# Patient Record
Sex: Male | Born: 1935 | Race: White | Hispanic: No | Marital: Married | Smoking: Never smoker
Health system: Southern US, Community
[De-identification: ages and names within clinical notes are randomized; demographics above are authoritative.]

## PROBLEM LIST (undated history)

## (undated) DIAGNOSIS — C8290 Follicular lymphoma, unspecified, unspecified site: Secondary | ICD-10-CM

## (undated) DIAGNOSIS — F419 Anxiety disorder, unspecified: Secondary | ICD-10-CM

## (undated) DIAGNOSIS — I10 Essential (primary) hypertension: Secondary | ICD-10-CM

## (undated) DIAGNOSIS — E079 Disorder of thyroid, unspecified: Secondary | ICD-10-CM

## (undated) HISTORY — DX: Anxiety disorder, unspecified: F41.9

## (undated) HISTORY — DX: Essential (primary) hypertension: I10

## (undated) HISTORY — DX: Follicular lymphoma, unspecified, unspecified site: C82.90

## (undated) HISTORY — DX: Disorder of thyroid, unspecified: E07.9

---

## 2008-11-24 HISTORY — PX: EYE SURGERY: SHX253

## 2012-01-14 DIAGNOSIS — H4011X0 Primary open-angle glaucoma, stage unspecified: Secondary | ICD-10-CM | POA: Diagnosis not present

## 2012-02-18 DIAGNOSIS — D2370 Other benign neoplasm of skin of unspecified lower limb, including hip: Secondary | ICD-10-CM | POA: Diagnosis not present

## 2012-03-18 DIAGNOSIS — C8299 Follicular lymphoma, unspecified, extranodal and solid organ sites: Secondary | ICD-10-CM | POA: Diagnosis not present

## 2012-03-18 DIAGNOSIS — Z87898 Personal history of other specified conditions: Secondary | ICD-10-CM | POA: Diagnosis not present

## 2012-04-12 DIAGNOSIS — I10 Essential (primary) hypertension: Secondary | ICD-10-CM | POA: Diagnosis not present

## 2012-04-12 DIAGNOSIS — R000 Tachycardia, unspecified: Secondary | ICD-10-CM | POA: Diagnosis not present

## 2012-04-12 DIAGNOSIS — Z79899 Other long term (current) drug therapy: Secondary | ICD-10-CM | POA: Diagnosis not present

## 2012-04-12 DIAGNOSIS — Z0000 Encounter for general adult medical examination without abnormal findings: Secondary | ICD-10-CM | POA: Diagnosis not present

## 2012-04-12 DIAGNOSIS — E039 Hypothyroidism, unspecified: Secondary | ICD-10-CM | POA: Diagnosis not present

## 2012-04-12 DIAGNOSIS — E785 Hyperlipidemia, unspecified: Secondary | ICD-10-CM | POA: Diagnosis not present

## 2012-04-30 DIAGNOSIS — H4011X0 Primary open-angle glaucoma, stage unspecified: Secondary | ICD-10-CM | POA: Diagnosis not present

## 2012-04-30 DIAGNOSIS — H35379 Puckering of macula, unspecified eye: Secondary | ICD-10-CM | POA: Diagnosis not present

## 2012-04-30 DIAGNOSIS — H409 Unspecified glaucoma: Secondary | ICD-10-CM | POA: Diagnosis not present

## 2012-04-30 DIAGNOSIS — H35349 Macular cyst, hole, or pseudohole, unspecified eye: Secondary | ICD-10-CM | POA: Diagnosis not present

## 2012-05-25 DIAGNOSIS — E039 Hypothyroidism, unspecified: Secondary | ICD-10-CM | POA: Diagnosis not present

## 2012-06-08 DIAGNOSIS — H35379 Puckering of macula, unspecified eye: Secondary | ICD-10-CM | POA: Diagnosis not present

## 2012-06-08 DIAGNOSIS — H35349 Macular cyst, hole, or pseudohole, unspecified eye: Secondary | ICD-10-CM | POA: Diagnosis not present

## 2012-06-08 DIAGNOSIS — H43819 Vitreous degeneration, unspecified eye: Secondary | ICD-10-CM | POA: Diagnosis not present

## 2012-06-15 DIAGNOSIS — E039 Hypothyroidism, unspecified: Secondary | ICD-10-CM | POA: Diagnosis not present

## 2012-06-30 DIAGNOSIS — D485 Neoplasm of uncertain behavior of skin: Secondary | ICD-10-CM | POA: Diagnosis not present

## 2012-06-30 DIAGNOSIS — L821 Other seborrheic keratosis: Secondary | ICD-10-CM | POA: Diagnosis not present

## 2012-06-30 DIAGNOSIS — L570 Actinic keratosis: Secondary | ICD-10-CM | POA: Diagnosis not present

## 2012-07-21 DIAGNOSIS — L570 Actinic keratosis: Secondary | ICD-10-CM | POA: Diagnosis not present

## 2012-08-23 DIAGNOSIS — Z23 Encounter for immunization: Secondary | ICD-10-CM | POA: Diagnosis not present

## 2012-10-13 DIAGNOSIS — I10 Essential (primary) hypertension: Secondary | ICD-10-CM | POA: Diagnosis not present

## 2012-10-13 DIAGNOSIS — E785 Hyperlipidemia, unspecified: Secondary | ICD-10-CM | POA: Diagnosis not present

## 2012-10-13 DIAGNOSIS — Z23 Encounter for immunization: Secondary | ICD-10-CM | POA: Diagnosis not present

## 2012-10-13 DIAGNOSIS — E039 Hypothyroidism, unspecified: Secondary | ICD-10-CM | POA: Diagnosis not present

## 2012-10-13 DIAGNOSIS — R000 Tachycardia, unspecified: Secondary | ICD-10-CM | POA: Diagnosis not present

## 2012-12-03 DIAGNOSIS — H35349 Macular cyst, hole, or pseudohole, unspecified eye: Secondary | ICD-10-CM | POA: Diagnosis not present

## 2012-12-03 DIAGNOSIS — H409 Unspecified glaucoma: Secondary | ICD-10-CM | POA: Diagnosis not present

## 2012-12-03 DIAGNOSIS — Z961 Presence of intraocular lens: Secondary | ICD-10-CM | POA: Diagnosis not present

## 2012-12-03 DIAGNOSIS — H4011X0 Primary open-angle glaucoma, stage unspecified: Secondary | ICD-10-CM | POA: Diagnosis not present

## 2013-01-28 DIAGNOSIS — Z01818 Encounter for other preprocedural examination: Secondary | ICD-10-CM | POA: Diagnosis not present

## 2013-02-08 DIAGNOSIS — L570 Actinic keratosis: Secondary | ICD-10-CM | POA: Diagnosis not present

## 2013-02-08 DIAGNOSIS — D0420 Carcinoma in situ of skin of unspecified ear and external auricular canal: Secondary | ICD-10-CM | POA: Diagnosis not present

## 2013-02-08 DIAGNOSIS — Z85828 Personal history of other malignant neoplasm of skin: Secondary | ICD-10-CM | POA: Diagnosis not present

## 2013-02-08 DIAGNOSIS — L820 Inflamed seborrheic keratosis: Secondary | ICD-10-CM | POA: Diagnosis not present

## 2013-02-11 DIAGNOSIS — IMO0001 Reserved for inherently not codable concepts without codable children: Secondary | ICD-10-CM | POA: Diagnosis not present

## 2013-02-11 DIAGNOSIS — IMO0002 Reserved for concepts with insufficient information to code with codable children: Secondary | ICD-10-CM | POA: Diagnosis not present

## 2013-03-07 DIAGNOSIS — I10 Essential (primary) hypertension: Secondary | ICD-10-CM | POA: Diagnosis not present

## 2013-03-07 DIAGNOSIS — R000 Tachycardia, unspecified: Secondary | ICD-10-CM | POA: Diagnosis not present

## 2013-03-07 DIAGNOSIS — M542 Cervicalgia: Secondary | ICD-10-CM | POA: Diagnosis not present

## 2013-03-16 DIAGNOSIS — Z87898 Personal history of other specified conditions: Secondary | ICD-10-CM | POA: Diagnosis not present

## 2013-03-16 DIAGNOSIS — C8299 Follicular lymphoma, unspecified, extranodal and solid organ sites: Secondary | ICD-10-CM | POA: Diagnosis not present

## 2013-05-11 DIAGNOSIS — L570 Actinic keratosis: Secondary | ICD-10-CM | POA: Diagnosis not present

## 2013-05-11 DIAGNOSIS — C44221 Squamous cell carcinoma of skin of unspecified ear and external auricular canal: Secondary | ICD-10-CM | POA: Diagnosis not present

## 2013-07-09 DIAGNOSIS — R21 Rash and other nonspecific skin eruption: Secondary | ICD-10-CM | POA: Diagnosis not present

## 2013-07-09 DIAGNOSIS — B356 Tinea cruris: Secondary | ICD-10-CM | POA: Diagnosis not present

## 2013-07-12 DIAGNOSIS — E785 Hyperlipidemia, unspecified: Secondary | ICD-10-CM | POA: Diagnosis not present

## 2013-07-12 DIAGNOSIS — D696 Thrombocytopenia, unspecified: Secondary | ICD-10-CM | POA: Diagnosis not present

## 2013-07-12 DIAGNOSIS — I2699 Other pulmonary embolism without acute cor pulmonale: Secondary | ICD-10-CM | POA: Diagnosis not present

## 2013-07-12 DIAGNOSIS — Z86718 Personal history of other venous thrombosis and embolism: Secondary | ICD-10-CM | POA: Diagnosis not present

## 2013-07-12 DIAGNOSIS — R609 Edema, unspecified: Secondary | ICD-10-CM | POA: Diagnosis not present

## 2013-07-12 DIAGNOSIS — I824Y9 Acute embolism and thrombosis of unspecified deep veins of unspecified proximal lower extremity: Secondary | ICD-10-CM | POA: Diagnosis not present

## 2013-07-12 DIAGNOSIS — I824Z9 Acute embolism and thrombosis of unspecified deep veins of unspecified distal lower extremity: Secondary | ICD-10-CM | POA: Diagnosis not present

## 2013-07-12 DIAGNOSIS — I82409 Acute embolism and thrombosis of unspecified deep veins of unspecified lower extremity: Secondary | ICD-10-CM | POA: Diagnosis not present

## 2013-07-12 DIAGNOSIS — Z87898 Personal history of other specified conditions: Secondary | ICD-10-CM | POA: Diagnosis not present

## 2013-07-12 DIAGNOSIS — Z7901 Long term (current) use of anticoagulants: Secondary | ICD-10-CM | POA: Diagnosis not present

## 2013-07-12 DIAGNOSIS — I10 Essential (primary) hypertension: Secondary | ICD-10-CM | POA: Diagnosis not present

## 2013-07-13 DIAGNOSIS — I82409 Acute embolism and thrombosis of unspecified deep veins of unspecified lower extremity: Secondary | ICD-10-CM | POA: Diagnosis not present

## 2013-07-15 DIAGNOSIS — R51 Headache: Secondary | ICD-10-CM | POA: Diagnosis not present

## 2013-07-21 DIAGNOSIS — I82409 Acute embolism and thrombosis of unspecified deep veins of unspecified lower extremity: Secondary | ICD-10-CM | POA: Diagnosis not present

## 2013-07-21 DIAGNOSIS — Z87898 Personal history of other specified conditions: Secondary | ICD-10-CM | POA: Diagnosis not present

## 2013-08-04 DIAGNOSIS — Z01812 Encounter for preprocedural laboratory examination: Secondary | ICD-10-CM | POA: Diagnosis not present

## 2013-08-04 DIAGNOSIS — C8589 Other specified types of non-Hodgkin lymphoma, extranodal and solid organ sites: Secondary | ICD-10-CM | POA: Diagnosis not present

## 2013-08-05 DIAGNOSIS — H40019 Open angle with borderline findings, low risk, unspecified eye: Secondary | ICD-10-CM | POA: Diagnosis not present

## 2013-08-05 DIAGNOSIS — H35379 Puckering of macula, unspecified eye: Secondary | ICD-10-CM | POA: Diagnosis not present

## 2013-08-05 DIAGNOSIS — Z961 Presence of intraocular lens: Secondary | ICD-10-CM | POA: Diagnosis not present

## 2013-08-09 DIAGNOSIS — Z87898 Personal history of other specified conditions: Secondary | ICD-10-CM | POA: Diagnosis not present

## 2013-08-09 DIAGNOSIS — R918 Other nonspecific abnormal finding of lung field: Secondary | ICD-10-CM | POA: Diagnosis not present

## 2013-08-09 DIAGNOSIS — I82409 Acute embolism and thrombosis of unspecified deep veins of unspecified lower extremity: Secondary | ICD-10-CM | POA: Diagnosis not present

## 2013-08-19 DIAGNOSIS — R918 Other nonspecific abnormal finding of lung field: Secondary | ICD-10-CM | POA: Diagnosis not present

## 2013-08-19 DIAGNOSIS — I82409 Acute embolism and thrombosis of unspecified deep veins of unspecified lower extremity: Secondary | ICD-10-CM | POA: Diagnosis not present

## 2013-08-23 DIAGNOSIS — I10 Essential (primary) hypertension: Secondary | ICD-10-CM | POA: Diagnosis not present

## 2013-08-23 DIAGNOSIS — I82409 Acute embolism and thrombosis of unspecified deep veins of unspecified lower extremity: Secondary | ICD-10-CM | POA: Diagnosis not present

## 2013-11-03 DIAGNOSIS — Z23 Encounter for immunization: Secondary | ICD-10-CM | POA: Diagnosis not present

## 2013-11-03 DIAGNOSIS — E785 Hyperlipidemia, unspecified: Secondary | ICD-10-CM | POA: Diagnosis not present

## 2013-11-03 DIAGNOSIS — Z719 Counseling, unspecified: Secondary | ICD-10-CM | POA: Diagnosis not present

## 2013-11-03 DIAGNOSIS — E039 Hypothyroidism, unspecified: Secondary | ICD-10-CM | POA: Diagnosis not present

## 2013-11-03 DIAGNOSIS — I10 Essential (primary) hypertension: Secondary | ICD-10-CM | POA: Diagnosis not present

## 2013-11-03 DIAGNOSIS — Z79899 Other long term (current) drug therapy: Secondary | ICD-10-CM | POA: Diagnosis not present

## 2013-11-09 DIAGNOSIS — C4441 Basal cell carcinoma of skin of scalp and neck: Secondary | ICD-10-CM | POA: Diagnosis not present

## 2013-11-09 DIAGNOSIS — D235 Other benign neoplasm of skin of trunk: Secondary | ICD-10-CM | POA: Diagnosis not present

## 2013-11-09 DIAGNOSIS — L821 Other seborrheic keratosis: Secondary | ICD-10-CM | POA: Diagnosis not present

## 2013-11-09 DIAGNOSIS — L570 Actinic keratosis: Secondary | ICD-10-CM | POA: Diagnosis not present

## 2014-02-07 DIAGNOSIS — D0460 Carcinoma in situ of skin of unspecified upper limb, including shoulder: Secondary | ICD-10-CM | POA: Diagnosis not present

## 2014-02-07 DIAGNOSIS — L570 Actinic keratosis: Secondary | ICD-10-CM | POA: Diagnosis not present

## 2014-02-26 DIAGNOSIS — J309 Allergic rhinitis, unspecified: Secondary | ICD-10-CM | POA: Diagnosis not present

## 2014-02-26 DIAGNOSIS — R070 Pain in throat: Secondary | ICD-10-CM | POA: Diagnosis not present

## 2014-03-10 DIAGNOSIS — Z7189 Other specified counseling: Secondary | ICD-10-CM | POA: Diagnosis not present

## 2014-03-10 DIAGNOSIS — F411 Generalized anxiety disorder: Secondary | ICD-10-CM | POA: Diagnosis not present

## 2014-03-10 DIAGNOSIS — I82409 Acute embolism and thrombosis of unspecified deep veins of unspecified lower extremity: Secondary | ICD-10-CM | POA: Diagnosis not present

## 2014-03-10 DIAGNOSIS — C8589 Other specified types of non-Hodgkin lymphoma, extranodal and solid organ sites: Secondary | ICD-10-CM | POA: Diagnosis not present

## 2014-03-14 DIAGNOSIS — E7800 Pure hypercholesterolemia, unspecified: Secondary | ICD-10-CM | POA: Diagnosis not present

## 2014-03-14 DIAGNOSIS — C8589 Other specified types of non-Hodgkin lymphoma, extranodal and solid organ sites: Secondary | ICD-10-CM | POA: Diagnosis not present

## 2014-03-14 DIAGNOSIS — I10 Essential (primary) hypertension: Secondary | ICD-10-CM | POA: Diagnosis not present

## 2014-03-14 DIAGNOSIS — E039 Hypothyroidism, unspecified: Secondary | ICD-10-CM | POA: Diagnosis not present

## 2014-03-27 ENCOUNTER — Ambulatory Visit: Payer: Self-pay | Admitting: Oncology

## 2014-03-27 DIAGNOSIS — E039 Hypothyroidism, unspecified: Secondary | ICD-10-CM | POA: Diagnosis not present

## 2014-03-27 DIAGNOSIS — I87099 Postthrombotic syndrome with other complications of unspecified lower extremity: Secondary | ICD-10-CM | POA: Diagnosis not present

## 2014-03-27 DIAGNOSIS — Z79899 Other long term (current) drug therapy: Secondary | ICD-10-CM | POA: Diagnosis not present

## 2014-03-27 DIAGNOSIS — I890 Lymphedema, not elsewhere classified: Secondary | ICD-10-CM | POA: Diagnosis not present

## 2014-03-27 DIAGNOSIS — D693 Immune thrombocytopenic purpura: Secondary | ICD-10-CM | POA: Diagnosis not present

## 2014-03-27 DIAGNOSIS — M7989 Other specified soft tissue disorders: Secondary | ICD-10-CM | POA: Diagnosis not present

## 2014-03-27 DIAGNOSIS — R5383 Other fatigue: Secondary | ICD-10-CM | POA: Diagnosis not present

## 2014-03-27 DIAGNOSIS — C8299 Follicular lymphoma, unspecified, extranodal and solid organ sites: Secondary | ICD-10-CM | POA: Diagnosis not present

## 2014-03-27 DIAGNOSIS — F411 Generalized anxiety disorder: Secondary | ICD-10-CM | POA: Diagnosis not present

## 2014-03-27 DIAGNOSIS — I872 Venous insufficiency (chronic) (peripheral): Secondary | ICD-10-CM | POA: Diagnosis not present

## 2014-03-27 DIAGNOSIS — I82409 Acute embolism and thrombosis of unspecified deep veins of unspecified lower extremity: Secondary | ICD-10-CM | POA: Diagnosis not present

## 2014-03-27 DIAGNOSIS — I10 Essential (primary) hypertension: Secondary | ICD-10-CM | POA: Diagnosis not present

## 2014-03-27 DIAGNOSIS — Z7901 Long term (current) use of anticoagulants: Secondary | ICD-10-CM | POA: Diagnosis not present

## 2014-03-27 DIAGNOSIS — R5381 Other malaise: Secondary | ICD-10-CM | POA: Diagnosis not present

## 2014-03-27 LAB — LACTATE DEHYDROGENASE: LDH: 244 U/L — ABNORMAL HIGH (ref 85–241)

## 2014-03-27 LAB — SEDIMENTATION RATE: Erythrocyte Sed Rate: 10 mm/hr (ref 0–20)

## 2014-04-24 ENCOUNTER — Ambulatory Visit: Payer: Self-pay | Admitting: Oncology

## 2014-04-24 DIAGNOSIS — I824Y9 Acute embolism and thrombosis of unspecified deep veins of unspecified proximal lower extremity: Secondary | ICD-10-CM | POA: Diagnosis not present

## 2014-04-24 DIAGNOSIS — I10 Essential (primary) hypertension: Secondary | ICD-10-CM | POA: Diagnosis not present

## 2014-04-24 DIAGNOSIS — E785 Hyperlipidemia, unspecified: Secondary | ICD-10-CM | POA: Diagnosis not present

## 2014-04-24 DIAGNOSIS — M7989 Other specified soft tissue disorders: Secondary | ICD-10-CM | POA: Diagnosis not present

## 2014-05-19 DIAGNOSIS — D1801 Hemangioma of skin and subcutaneous tissue: Secondary | ICD-10-CM | POA: Diagnosis not present

## 2014-05-19 DIAGNOSIS — L570 Actinic keratosis: Secondary | ICD-10-CM | POA: Diagnosis not present

## 2014-05-25 ENCOUNTER — Encounter: Payer: Self-pay | Admitting: Podiatrist

## 2014-05-25 ENCOUNTER — Ambulatory Visit (INDEPENDENT_AMBULATORY_CARE_PROVIDER_SITE_OTHER): Payer: Medicare Other | Admitting: Podiatrist

## 2014-05-25 VITALS — BP 150/88 | HR 82 | Resp 16 | Ht 67.0 in | Wt 152.0 lb

## 2014-05-25 DIAGNOSIS — B351 Tinea unguium: Secondary | ICD-10-CM | POA: Diagnosis not present

## 2014-05-25 DIAGNOSIS — M79673 Pain in unspecified foot: Secondary | ICD-10-CM

## 2014-05-25 DIAGNOSIS — M79609 Pain in unspecified limb: Secondary | ICD-10-CM

## 2014-05-25 NOTE — Progress Notes (Signed)
   Subjective:    Patient ID: Manuel Reynolds, male    DOB: [birth year], 80 y.o.   MRN: [id number]  HPI Comments: i need my toenails trimmed. They do not hurt but i have discomfort in them. They have been like this for years. i dont know if they are getting worse. i cut my toenails.     Review of Systems  All other systems reviewed and are negative.      Objective:   Physical Exam Neurovascular status is intact pedal pulses are palpable in sensation is intact bilateral. Patient's toenails are elongated, thickened, discolored, dystrophic, clinically mycotic and painful.       Assessment & Plan:  Symptomatic mycotic toenails bilateral.  Plan: Debridement of nails was carried out today without complication. He'll be seen back as needed for followup.

## 2014-06-16 DIAGNOSIS — E039 Hypothyroidism, unspecified: Secondary | ICD-10-CM | POA: Diagnosis not present

## 2014-06-16 DIAGNOSIS — I10 Essential (primary) hypertension: Secondary | ICD-10-CM | POA: Diagnosis not present

## 2014-06-16 DIAGNOSIS — C8589 Other specified types of non-Hodgkin lymphoma, extranodal and solid organ sites: Secondary | ICD-10-CM | POA: Diagnosis not present

## 2014-06-16 DIAGNOSIS — E7800 Pure hypercholesterolemia, unspecified: Secondary | ICD-10-CM | POA: Diagnosis not present

## 2014-06-19 DIAGNOSIS — L570 Actinic keratosis: Secondary | ICD-10-CM | POA: Diagnosis not present

## 2014-06-23 DIAGNOSIS — H4010X0 Unspecified open-angle glaucoma, stage unspecified: Secondary | ICD-10-CM | POA: Diagnosis not present

## 2014-07-24 DIAGNOSIS — L570 Actinic keratosis: Secondary | ICD-10-CM | POA: Diagnosis not present

## 2014-07-28 DIAGNOSIS — H4010X0 Unspecified open-angle glaucoma, stage unspecified: Secondary | ICD-10-CM | POA: Diagnosis not present

## 2014-07-31 DIAGNOSIS — Z23 Encounter for immunization: Secondary | ICD-10-CM | POA: Diagnosis not present

## 2014-08-04 DIAGNOSIS — H4010X0 Unspecified open-angle glaucoma, stage unspecified: Secondary | ICD-10-CM | POA: Diagnosis not present

## 2014-08-10 DIAGNOSIS — D485 Neoplasm of uncertain behavior of skin: Secondary | ICD-10-CM | POA: Diagnosis not present

## 2014-08-10 DIAGNOSIS — L570 Actinic keratosis: Secondary | ICD-10-CM | POA: Diagnosis not present

## 2014-08-10 DIAGNOSIS — C44621 Squamous cell carcinoma of skin of unspecified upper limb, including shoulder: Secondary | ICD-10-CM | POA: Diagnosis not present

## 2014-08-17 DIAGNOSIS — C44621 Squamous cell carcinoma of skin of unspecified upper limb, including shoulder: Secondary | ICD-10-CM | POA: Diagnosis not present

## 2014-09-01 DIAGNOSIS — H4011X2 Primary open-angle glaucoma, moderate stage: Secondary | ICD-10-CM | POA: Diagnosis not present

## 2014-09-27 ENCOUNTER — Ambulatory Visit: Payer: Self-pay | Admitting: Oncology

## 2014-09-27 DIAGNOSIS — D693 Immune thrombocytopenic purpura: Secondary | ICD-10-CM | POA: Diagnosis not present

## 2014-09-27 DIAGNOSIS — F419 Anxiety disorder, unspecified: Secondary | ICD-10-CM | POA: Diagnosis not present

## 2014-09-27 DIAGNOSIS — I10 Essential (primary) hypertension: Secondary | ICD-10-CM | POA: Diagnosis not present

## 2014-09-27 DIAGNOSIS — Z79899 Other long term (current) drug therapy: Secondary | ICD-10-CM | POA: Diagnosis not present

## 2014-09-27 DIAGNOSIS — C8290 Follicular lymphoma, unspecified, unspecified site: Secondary | ICD-10-CM | POA: Diagnosis not present

## 2014-09-27 DIAGNOSIS — Z86718 Personal history of other venous thrombosis and embolism: Secondary | ICD-10-CM | POA: Diagnosis not present

## 2014-09-27 DIAGNOSIS — R531 Weakness: Secondary | ICD-10-CM | POA: Diagnosis not present

## 2014-09-27 DIAGNOSIS — E039 Hypothyroidism, unspecified: Secondary | ICD-10-CM | POA: Diagnosis not present

## 2014-09-27 DIAGNOSIS — R5383 Other fatigue: Secondary | ICD-10-CM | POA: Diagnosis not present

## 2014-09-27 DIAGNOSIS — Z9221 Personal history of antineoplastic chemotherapy: Secondary | ICD-10-CM | POA: Diagnosis not present

## 2014-09-27 LAB — COMPREHENSIVE METABOLIC PANEL
Albumin: 3.8 g/dL (ref 3.4–5.0)
Alkaline Phosphatase: 81 U/L
Anion Gap: 7 (ref 7–16)
BUN: 24 mg/dL — ABNORMAL HIGH (ref 7–18)
Bilirubin,Total: 0.4 mg/dL (ref 0.2–1.0)
Calcium, Total: 9 mg/dL (ref 8.5–10.1)
Chloride: 105 mmol/L (ref 98–107)
Co2: 26 mmol/L (ref 21–32)
Creatinine: 1.36 mg/dL — ABNORMAL HIGH (ref 0.60–1.30)
EGFR (African American): >60
EGFR (Non-African Amer.): 53 — ABNORMAL LOW
Glucose: 93 mg/dL (ref 65–99)
Osmolality: 279 (ref 275–301)
Potassium: 4.2 mmol/L (ref 3.5–5.1)
SGOT(AST): 19 U/L (ref 15–37)
SGPT (ALT): 27 U/L
Sodium: 138 mmol/L (ref 136–145)
Total Protein: 7 g/dL (ref 6.4–8.2)

## 2014-09-27 LAB — CBC CANCER CENTER
Basophil #: 0 x10 3/mm (ref 0.0–0.1)
Basophil %: 0.4 %
Eosinophil #: 0.1 x10 3/mm (ref 0.0–0.7)
Eosinophil %: 1.2 %
HCT: 42.3 % (ref 40.0–52.0)
HGB: 14.3 g/dL (ref 13.0–18.0)
Lymphocyte #: 2.5 x10 3/mm (ref 1.0–3.6)
Lymphocyte %: 30.5 %
MCH: 29.8 pg (ref 26.0–34.0)
MCHC: 33.9 g/dL (ref 32.0–36.0)
MCV: 88 fL (ref 80–100)
Monocyte #: 0.9 x10 3/mm (ref 0.2–1.0)
Monocyte %: 11 %
Neutrophil #: 4.7 x10 3/mm (ref 1.4–6.5)
Neutrophil %: 56.9 %
Platelet: 234 x10 3/mm (ref 150–440)
RBC: 4.81 10*6/uL (ref 4.40–5.90)
RDW: 15 % — ABNORMAL HIGH (ref 11.5–14.5)
WBC: 8.3 x10 3/mm (ref 3.8–10.6)

## 2014-09-27 LAB — LACTATE DEHYDROGENASE: LDH: 204 U/L (ref 85–241)

## 2014-09-27 LAB — SEDIMENTATION RATE: Erythrocyte Sed Rate: 17 mm/hr (ref 0–20)

## 2014-09-29 DIAGNOSIS — H4011X2 Primary open-angle glaucoma, moderate stage: Secondary | ICD-10-CM | POA: Diagnosis not present

## 2014-10-13 DIAGNOSIS — X32XXXA Exposure to sunlight, initial encounter: Secondary | ICD-10-CM | POA: Diagnosis not present

## 2014-10-13 DIAGNOSIS — L570 Actinic keratosis: Secondary | ICD-10-CM | POA: Diagnosis not present

## 2014-10-13 DIAGNOSIS — L821 Other seborrheic keratosis: Secondary | ICD-10-CM | POA: Diagnosis not present

## 2014-10-13 DIAGNOSIS — Z85828 Personal history of other malignant neoplasm of skin: Secondary | ICD-10-CM | POA: Diagnosis not present

## 2014-10-24 ENCOUNTER — Ambulatory Visit: Payer: Self-pay | Admitting: Oncology

## 2014-10-26 DIAGNOSIS — I10 Essential (primary) hypertension: Secondary | ICD-10-CM | POA: Diagnosis not present

## 2014-10-26 DIAGNOSIS — I87099 Postthrombotic syndrome with other complications of unspecified lower extremity: Secondary | ICD-10-CM | POA: Diagnosis not present

## 2014-10-26 DIAGNOSIS — E785 Hyperlipidemia, unspecified: Secondary | ICD-10-CM | POA: Diagnosis not present

## 2015-02-13 DIAGNOSIS — H4011X2 Primary open-angle glaucoma, moderate stage: Secondary | ICD-10-CM | POA: Diagnosis not present

## 2015-05-11 ENCOUNTER — Ambulatory Visit (INDEPENDENT_AMBULATORY_CARE_PROVIDER_SITE_OTHER): Payer: Medicare Other | Admitting: Family Medicine

## 2015-05-11 ENCOUNTER — Encounter: Payer: Self-pay | Admitting: Family Medicine

## 2015-05-11 VITALS — BP 140/80 | HR 80 | Temp 98.6°F | Resp 16 | Ht 67.0 in | Wt 157.8 lb

## 2015-05-11 DIAGNOSIS — I10 Essential (primary) hypertension: Secondary | ICD-10-CM | POA: Diagnosis not present

## 2015-05-11 DIAGNOSIS — N5201 Erectile dysfunction due to arterial insufficiency: Secondary | ICD-10-CM | POA: Diagnosis not present

## 2015-05-11 DIAGNOSIS — E7800 Pure hypercholesterolemia, unspecified: Secondary | ICD-10-CM

## 2015-05-11 DIAGNOSIS — F419 Anxiety disorder, unspecified: Secondary | ICD-10-CM | POA: Diagnosis not present

## 2015-05-11 DIAGNOSIS — E785 Hyperlipidemia, unspecified: Secondary | ICD-10-CM | POA: Insufficient documentation

## 2015-05-11 DIAGNOSIS — E038 Other specified hypothyroidism: Secondary | ICD-10-CM

## 2015-05-11 DIAGNOSIS — N183 Chronic kidney disease, stage 3 unspecified: Secondary | ICD-10-CM | POA: Insufficient documentation

## 2015-05-11 DIAGNOSIS — E034 Atrophy of thyroid (acquired): Secondary | ICD-10-CM

## 2015-05-11 DIAGNOSIS — E039 Hypothyroidism, unspecified: Secondary | ICD-10-CM | POA: Insufficient documentation

## 2015-05-11 DIAGNOSIS — I129 Hypertensive chronic kidney disease with stage 1 through stage 4 chronic kidney disease, or unspecified chronic kidney disease: Secondary | ICD-10-CM | POA: Insufficient documentation

## 2015-05-11 DIAGNOSIS — N529 Male erectile dysfunction, unspecified: Secondary | ICD-10-CM | POA: Insufficient documentation

## 2015-05-11 MED ORDER — SIMVASTATIN 40 MG PO TABS: 40.0000 mg | ORAL_TABLET | Freq: Every day | ORAL | Status: DC

## 2015-05-11 MED ORDER — VIAGRA 100 MG PO TABS: 50.0000 mg | ORAL_TABLET | ORAL | Status: DC | PRN

## 2015-05-11 MED ORDER — ACEBUTOLOL HCL 200 MG PO CAPS: 200.0000 mg | ORAL_CAPSULE | Freq: Every day | ORAL | Status: DC

## 2015-05-11 MED ORDER — AMLODIPINE BESYLATE 10 MG PO TABS: 10.0000 mg | ORAL_TABLET | Freq: Every day | ORAL | Status: DC

## 2015-05-11 MED ORDER — DIAZEPAM 2 MG PO TABS: 2.0000 mg | ORAL_TABLET | ORAL | Status: DC | PRN

## 2015-05-11 MED ORDER — SERTRALINE HCL 25 MG PO TABS: 25.0000 mg | ORAL_TABLET | Freq: Every day | ORAL | Status: DC

## 2015-05-11 MED ORDER — LOSARTAN POTASSIUM 50 MG PO TABS: 50.0000 mg | ORAL_TABLET | Freq: Every day | ORAL | Status: DC

## 2015-05-11 MED ORDER — LEVOTHYROXINE SODIUM 50 MCG PO TABS: 50.0000 ug | ORAL_TABLET | Freq: Every day | ORAL | Status: DC

## 2015-05-11 NOTE — Progress Notes (Signed)
Name: Manuel Reynolds   MRN: [id number]    DOB: Nov 13, 1930   Date:05/11/2015       Progress Note  Subjective  Chief Complaint  Chief Complaint  Patient presents with  . Hypertension    HPI   Here to get meds refilled.  No visit in past 11 months.  No c/o except anxiety.   Takes Valium 2 mg about every day.  Still ujses Viagra once every 3-4 weeks  Past Medical History  Diagnosis Date  . Anxiety   . Hypertension   . Depression   . Thyroid disease     Past Surgical History  Procedure Laterality Date  . Eye surgery  2010    cataract    Family History  Problem Relation Age of Onset  . Hypertension Father   . Cancer Sister     History   Social History  . Marital Status: Married    Spouse Name: N/A  . Number of Children: N/A  . Years of Education: N/A   Occupational History  . Not on file.   Social History Main Topics  . Smoking status: Never Smoker   . Smokeless tobacco: Never Used  . Alcohol Use: No  . Drug Use: No  . Sexual Activity: Not on file   Other Topics Concern  . Not on file   Social History Narrative     Current outpatient prescriptions:  .  acebutolol (SECTRAL) 200 MG capsule, Take 200 mg by mouth daily., Disp: , Rfl:  .  amLODipine (NORVASC) 10 MG tablet, Take 10 mg by mouth daily., Disp: , Rfl:  .  COMBIGAN 0.2-0.5 % ophthalmic solution, INT 1 GTT IN OU BID, Disp: , Rfl: 5 .  DIAZEPAM PO, Take by mouth as needed., Disp: , Rfl:  .  Hydrocodone-Acetaminophen (VICODIN PO), Take by mouth as needed., Disp: , Rfl:  .  latanoprost (XALATAN) 0.005 % ophthalmic solution, 1 drop at bedtime., Disp: , Rfl:  .  levothyroxine (SYNTHROID, LEVOTHROID) 50 MCG tablet, TK 1 T PO D, Disp: , Rfl: 3 .  losartan (COZAAR) 50 MG tablet, TK 1 T PO D, Disp: , Rfl: 3 .  olmesartan (BENICAR) 20 MG tablet, Take 20 mg by mouth daily., Disp: , Rfl:  .  simvastatin (ZOCOR) 40 MG tablet, Take 40 mg by mouth daily., Disp: , Rfl:  .  VIAGRA 100 MG tablet, Take 0.5 tablets (50  mg total) by mouth as needed for erectile dysfunction., Disp: 6 tablet, Rfl: 12  Allergies  Allergen Reactions  . Sulfa Antibiotics Rash     Review of Systems  Constitutional: Negative.  Negative for fever, chills and malaise/fatigue.  HENT: Negative.  Negative for nosebleeds.   Eyes: Negative.  Negative for blurred vision and double vision.  Respiratory: Negative.  Negative for cough, sputum production, shortness of breath and wheezing.   Cardiovascular: Negative.  Negative for chest pain, palpitations, orthopnea and leg swelling.  Gastrointestinal: Negative.  Negative for heartburn, nausea, vomiting, abdominal pain, diarrhea and blood in stool.  Genitourinary: Positive for frequency (nocturia x 2). Negative for dysuria and hematuria.  Musculoskeletal: Negative.  Negative for myalgias and joint pain.  Skin: Negative.  Negative for rash.  Neurological: Negative.  Negative for dizziness, sensory change, speech change, focal weakness, weakness and headaches.  Psychiatric/Behavioral: Positive for hallucinations. Nervous/anxious: takes Valium every day.       Objective  Filed Vitals:   05/11/15 1554  BP: 143/77  Pulse: 80  Temp: 98.6 F (37 C)  Resp: 16  Height: 5\' 7"  (1.702 m)  Weight: 157 lb 12.8 oz (71.578 kg)    Physical Exam  Constitutional: He is oriented to person, place, and time and well-developed, well-nourished, and in no distress. No distress.  HENT:  Head: Normocephalic and atraumatic.  Eyes: EOM are normal. Pupils are equal, round, and reactive to light.  Neck: Normal range of motion. Neck supple. No thyromegaly present.  Cardiovascular: Normal rate, regular rhythm and intact distal pulses.  Exam reveals no gallop and no friction rub.   Murmur heard.  Crescendo systolic murmur is present with a grade of 3/6  Pulmonary/Chest: Effort normal and breath sounds normal. No respiratory distress. He has no wheezes. He has no rales. He exhibits no tenderness.   Abdominal: Soft. Bowel sounds are normal. He exhibits no mass. There is no tenderness.  Musculoskeletal: Normal range of motion. He exhibits edema.       Right lower leg: He exhibits edema (trace).       Left lower leg: He exhibits edema (1+).  Lymphadenopathy:    He has no cervical adenopathy.  Neurological: He is alert and oriented to person, place, and time.  Psychiatric: Mood, memory, affect and judgment normal.  Vitals reviewed.       Assessment & Plan  Problem List Items Addressed This Visit      Cardiovascular and Mediastinum   Hypertension - Primary   Relevant Medications   losartan (COZAAR) 50 MG tablet   VIAGRA 100 MG tablet     Endocrine   Hypothyroidism   Relevant Medications   levothyroxine (SYNTHROID, LEVOTHROID) 50 MCG tablet     Genitourinary   ED (erectile dysfunction)     Other   Elevated cholesterol   Relevant Medications   losartan (COZAAR) 50 MG tablet   VIAGRA 100 MG tablet   Acute anxiety      Meds ordered this encounter  Medications  . COMBIGAN 0.2-0.5 % ophthalmic solution    Sig: INT 1 GTT IN OU BID    Refill:  5  . levothyroxine (SYNTHROID, LEVOTHROID) 50 MCG tablet    Sig: TK 1 T PO D    Refill:  3  . losartan (COZAAR) 50 MG tablet    Sig: TK 1 T PO D    Refill:  3  . DISCONTD: VIAGRA 100 MG tablet    Sig: TK 1 T PO D    Refill:  10  . VIAGRA 100 MG tablet    Sig: Take 0.5 tablets (50 mg total) by mouth as needed for erectile dysfunction.    Dispense:  6 tablet    Refill:  12    1. Essential hypertension  - acebutolol (SECTRAL) 200 MG capsule; Take 1 capsule (200 mg total) by mouth daily.  Dispense: 30 capsule; Refill: 12 - amLODipine (NORVASC) 10 MG tablet; Take 1 tablet (10 mg total) by mouth daily.  Dispense: 30 tablet; Refill: 12 - losartan (COZAAR) 50 MG tablet; Take 1 tablet (50 mg total) by mouth daily.  Dispense: 30 tablet; Refill: 12 - Comprehensive Metabolic Panel (CMET)  2. Elevated cholesterol  -  simvastatin (ZOCOR) 40 MG tablet; Take 1 tablet (40 mg total) by mouth daily at 6 PM.  Dispense: 30 tablet; Refill: 12 - Lipid Profile  3. Hypothyroidism due to acquired atrophy of thyroid  - levothyroxine (SYNTHROID, LEVOTHROID) 50 MCG tablet; Take 1 tablet (50 mcg total) by mouth daily before breakfast.  Dispense: 30 tablet; Refill: 12 - TSH  4. Acute anxiety  - diazepam (VALIUM) 2 MG tablet; Take 1 tablet (2 mg total) by mouth as needed.  Dispense: 15 tablet; Refill: 0 - sertraline (ZOLOFT) 25 MG tablet; Take 1 tablet (25 mg total) by mouth daily.  Dispense: 30 tablet; Refill: 12  5. Erectile dysfunction due to arterial insufficiency  - CBC with Differential

## 2015-05-15 DIAGNOSIS — N5201 Erectile dysfunction due to arterial insufficiency: Secondary | ICD-10-CM | POA: Diagnosis not present

## 2015-05-15 DIAGNOSIS — I10 Essential (primary) hypertension: Secondary | ICD-10-CM | POA: Diagnosis not present

## 2015-05-15 DIAGNOSIS — E780 Pure hypercholesterolemia: Secondary | ICD-10-CM | POA: Diagnosis not present

## 2015-05-15 DIAGNOSIS — E038 Other specified hypothyroidism: Secondary | ICD-10-CM | POA: Diagnosis not present

## 2015-05-15 DIAGNOSIS — E034 Atrophy of thyroid (acquired): Secondary | ICD-10-CM | POA: Diagnosis not present

## 2015-05-15 LAB — CBC WITH DIFFERENTIAL/PLATELET
Basophils Absolute: 0 10*3/uL (ref 0.0–0.2)
Basos: 0 %
EOS (ABSOLUTE): 0.1 10*3/uL (ref 0.0–0.4)
Eos: 1 %
Hematocrit: 41.5 % (ref 37.5–51.0)
Hemoglobin: 14.7 g/dL (ref 12.6–17.7)
Immature Grans (Abs): 0 10*3/uL (ref 0.0–0.1)
Immature Granulocytes: 0 %
Lymphocytes Absolute: 2.9 10*3/uL (ref 0.7–3.1)
Lymphs: 38 %
MCH: 29.6 pg (ref 26.6–33.0)
MCHC: 35.4 g/dL (ref 31.5–35.7)
MCV: 84 fL (ref 79–97)
Monocytes Absolute: 0.9 10*3/uL (ref 0.1–0.9)
Monocytes: 11 %
Neutrophils Absolute: 3.8 10*3/uL (ref 1.4–7.0)
Neutrophils: 50 %
Platelets: 249 10*3/uL (ref 150–379)
RBC: 4.96 x10E6/uL (ref 4.14–5.80)
RDW: 15 % (ref 12.3–15.4)
WBC: 7.7 10*3/uL (ref 3.4–10.8)

## 2015-05-16 LAB — COMPREHENSIVE METABOLIC PANEL
ALT: 14 IU/L (ref 0–44)
AST: 18 IU/L (ref 0–40)
Albumin/Globulin Ratio: 2 (ref 1.1–2.5)
Albumin: 4.3 g/dL (ref 3.5–4.7)
Alkaline Phosphatase: 66 IU/L (ref 39–117)
BUN/Creatinine Ratio: 17 (ref 10–22)
BUN: 22 mg/dL (ref 8–27)
Bilirubin Total: 0.6 mg/dL (ref 0.0–1.2)
CO2: 23 mmol/L (ref 18–29)
Calcium: 9.2 mg/dL (ref 8.6–10.2)
Chloride: 103 mmol/L (ref 97–108)
Creatinine, Ser: 1.32 mg/dL — ABNORMAL HIGH (ref 0.76–1.27)
GFR calc Af Amer: 57 mL/min/{1.73_m2} — ABNORMAL LOW (ref >59)
GFR calc non Af Amer: 50 mL/min/{1.73_m2} — ABNORMAL LOW (ref >59)
Globulin, Total: 2.1 g/dL (ref 1.5–4.5)
Glucose: 97 mg/dL (ref 65–99)
Potassium: 4 mmol/L (ref 3.5–5.2)
Sodium: 141 mmol/L (ref 134–144)
Total Protein: 6.4 g/dL (ref 6.0–8.5)

## 2015-05-16 LAB — LIPID PANEL
Chol/HDL Ratio: 5.5 ratio units — ABNORMAL HIGH (ref 0.0–5.0)
Cholesterol, Total: 194 mg/dL (ref 100–199)
HDL: 35 mg/dL — ABNORMAL LOW (ref >39)
LDL Calculated: 133 mg/dL — ABNORMAL HIGH (ref 0–99)
Triglycerides: 131 mg/dL (ref 0–149)
VLDL Cholesterol Cal: 26 mg/dL (ref 5–40)

## 2015-05-16 LAB — TSH: TSH: 2.48 u[IU]/mL (ref 0.450–4.500)

## 2015-05-24 ENCOUNTER — Inpatient Hospital Stay: Payer: Medicare Other | Attending: Oncology

## 2015-05-24 ENCOUNTER — Other Ambulatory Visit: Payer: Self-pay | Admitting: *Deleted

## 2015-05-24 ENCOUNTER — Inpatient Hospital Stay: Payer: Medicare Other | Attending: Oncology | Admitting: Oncology

## 2015-05-24 VITALS — BP 158/79 | HR 87 | Temp 96.6°F | Wt 158.5 lb

## 2015-05-24 DIAGNOSIS — I10 Essential (primary) hypertension: Secondary | ICD-10-CM | POA: Insufficient documentation

## 2015-05-24 DIAGNOSIS — C8590 Non-Hodgkin lymphoma, unspecified, unspecified site: Secondary | ICD-10-CM

## 2015-05-24 DIAGNOSIS — F418 Other specified anxiety disorders: Secondary | ICD-10-CM | POA: Insufficient documentation

## 2015-05-24 DIAGNOSIS — C8299 Follicular lymphoma, unspecified, extranodal and solid organ sites: Secondary | ICD-10-CM

## 2015-05-24 DIAGNOSIS — Z79899 Other long term (current) drug therapy: Secondary | ICD-10-CM | POA: Insufficient documentation

## 2015-05-24 DIAGNOSIS — Z86718 Personal history of other venous thrombosis and embolism: Secondary | ICD-10-CM | POA: Diagnosis not present

## 2015-05-24 DIAGNOSIS — Z7901 Long term (current) use of anticoagulants: Secondary | ICD-10-CM | POA: Diagnosis not present

## 2015-05-24 DIAGNOSIS — C8290 Follicular lymphoma, unspecified, unspecified site: Secondary | ICD-10-CM | POA: Diagnosis not present

## 2015-05-24 DIAGNOSIS — D693 Immune thrombocytopenic purpura: Secondary | ICD-10-CM | POA: Diagnosis not present

## 2015-05-24 LAB — CBC WITH DIFFERENTIAL/PLATELET
Basophils Absolute: 0 10*3/uL (ref 0–0.1)
Basophils Relative: 0 %
Eosinophils Absolute: 0.1 10*3/uL (ref 0–0.7)
Eosinophils Relative: 1 %
HCT: 43.8 % (ref 40.0–52.0)
Hemoglobin: 14.8 g/dL (ref 13.0–18.0)
Lymphocytes Relative: 38 %
Lymphs Abs: 3.8 10*3/uL — ABNORMAL HIGH (ref 1.0–3.6)
MCH: 29.1 pg (ref 26.0–34.0)
MCHC: 33.9 g/dL (ref 32.0–36.0)
MCV: 86.1 fL (ref 80.0–100.0)
Monocytes Absolute: 0.9 10*3/uL (ref 0.2–1.0)
Monocytes Relative: 9 %
Neutro Abs: 5.2 10*3/uL (ref 1.4–6.5)
Neutrophils Relative %: 52 %
Platelets: 222 10*3/uL (ref 150–440)
RBC: 5.09 MIL/uL (ref 4.40–5.90)
RDW: 15.4 % — ABNORMAL HIGH (ref 11.5–14.5)
WBC: 10 10*3/uL (ref 3.8–10.6)

## 2015-05-24 LAB — COMPREHENSIVE METABOLIC PANEL
ALT: 18 U/L (ref 17–63)
AST: 23 U/L (ref 15–41)
Albumin: 4.4 g/dL (ref 3.5–5.0)
Alkaline Phosphatase: 77 U/L (ref 38–126)
Anion gap: 6 (ref 5–15)
BUN: 21 mg/dL — ABNORMAL HIGH (ref 6–20)
CO2: 23 mmol/L (ref 22–32)
Calcium: 8.5 mg/dL — ABNORMAL LOW (ref 8.9–10.3)
Chloride: 106 mmol/L (ref 101–111)
Creatinine, Ser: 1.27 mg/dL — ABNORMAL HIGH (ref 0.61–1.24)
GFR calc Af Amer: 59 mL/min — ABNORMAL LOW (ref >60)
GFR calc non Af Amer: 50 mL/min — ABNORMAL LOW (ref >60)
Glucose, Bld: 100 mg/dL — ABNORMAL HIGH (ref 65–99)
Potassium: 3.7 mmol/L (ref 3.5–5.1)
Sodium: 135 mmol/L (ref 135–145)
Total Bilirubin: 0.6 mg/dL (ref 0.3–1.2)
Total Protein: 7.3 g/dL (ref 6.5–8.1)

## 2015-05-24 LAB — LACTATE DEHYDROGENASE: LDH: 186 U/L (ref 98–192)

## 2015-05-24 NOTE — Progress Notes (Signed)
Patient does have living will. Never smoked. 

## 2015-05-25 ENCOUNTER — Encounter: Payer: Self-pay | Admitting: Oncology

## 2015-05-25 DIAGNOSIS — C8590 Non-Hodgkin lymphoma, unspecified, unspecified site: Secondary | ICD-10-CM | POA: Insufficient documentation

## 2015-05-25 DIAGNOSIS — C8290 Follicular lymphoma, unspecified, unspecified site: Secondary | ICD-10-CM

## 2015-05-25 HISTORY — DX: Follicular lymphoma, unspecified, unspecified site: C82.90

## 2015-05-25 NOTE — Progress Notes (Signed)
[facility] Telephone:([phone]) [phone]  Fax:(336) [phone]     Manuel Reynolds OB: 10-24-1930  MR#: [id number]  ERX#:[id number]  Patient Care Team: Arlis Porta., MD as PCP - General (Family Medicine)  CHIEF COMPLAINT:  Chief Complaint  Patient presents with  . Follow-up    Oncology History   1. 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 atient has been treated with CHOP followed by interferon FludarABINE, Rituxan,CNOP, and finally was given  BEXXAR in November of 2003 patient   had been in remission since then.   Had  PET scan in October  2005 which was negative  2.recently he developed deep vein thrombosis he lower extremity. Had a CT scan to rule out lymphoma nd started on XERALTO.  3.  ITP 4. Fungal infection of the lung which has resolved         Lymphoma malignant, nodular, lymphocytic   05/25/2015 Initial Diagnosis Lymphoma malignant, nodular, lymphocytic    No flowsheet data found.  INTERVAL HISTORY: 81 year old gentleman came today for further follow-up regarding low-grade lymphoma.  ITP.  Patient is getting regular checkup done by primary care physician.  No significant new problems.  Appetite has been stable.  Oh chills.  No fever.    REVIEW OF SYSTEMS:   GENERAL:  Feels good.  Active.  No fevers, sweats or weight loss. PERFORMANCE STATUS (ECOG): 01 HEENT:  No visual changes, runny nose, sore throat, mouth sores or tenderness. Lungs: No shortness of breath or cough.  No hemoptysis. Cardiac:  No chest pain, palpitations, orthopnea, or PND. GI:  No nausea, vomiting, diarrhea, constipation, melena or hematochezia. GU:  No urgency, frequency, dysuria, or hematuria. Musculoskeletal:  No back pain.  No joint pain.  No muscle tenderness. Extremities:  No pain or swelling. Skin:  No rashes or skin changes. Neuro:  No headache, numbness or weakness, balance or coordination issues. Endocrine:  No diabetes,  thyroid issues, hot flashes or night sweats. Psych:  No mood changes, depression or anxiety. Pain:  No focal pain. Review of systems:  All other systems reviewed and found to be negative. As per HPI. Otherwise, a complete review of systems is negatve.  PAST MEDICAL HISTORY: Past Medical History  Diagnosis Date  . Anxiety   . Hypertension   . Depression   . Thyroid disease   . Lymphoma malignant, nodular, lymphocytic 05/25/2015    PAST SURGICAL HISTORY: Past Surgical History  Procedure Laterality Date  . Eye surgery  2010    cataract    FAMILY HISTORY Family History  Problem Relation Age of Onset  . Hypertension Father   . Cancer Sister     ADVANCED DIRECTIVES:   does have advance HEALTH MAINTENANCE: History  Substance Use Topics  . Smoking status: Never Smoker   . Smokeless tobacco: Never Used  . Alcohol Use: No      Allergies  Allergen Reactions  . Sulfa Antibiotics Rash    Current Outpatient Prescriptions  Medication Sig Dispense Refill  . acebutolol (SECTRAL) 200 MG capsule Take 1 capsule (200 mg total) by mouth daily. 30 capsule 12  . amLODipine (NORVASC) 10 MG tablet Take 1 tablet (10 mg total) by mouth daily. 30 tablet 12  . COMBIGAN 0.2-0.5 % ophthalmic solution INT 1 GTT IN OU BID  5  . Hydrocodone-Acetaminophen (VICODIN PO) Take by mouth as needed.    . latanoprost (XALATAN) 0.005 % ophthalmic solution 1 drop at bedtime.    Marland Kitchen  levothyroxine (SYNTHROID, LEVOTHROID) 50 MCG tablet Take 1 tablet (50 mcg total) by mouth daily before breakfast. 30 tablet 12  . losartan (COZAAR) 50 MG tablet Take 1 tablet (50 mg total) by mouth daily. 30 tablet 12  . olmesartan (BENICAR) 20 MG tablet Take 20 mg by mouth daily.    . sertraline (ZOLOFT) 25 MG tablet Take 1 tablet (25 mg total) by mouth daily. 30 tablet 12  . simvastatin (ZOCOR) 40 MG tablet Take 1 tablet (40 mg total) by mouth daily at 6 PM. 30 tablet 12  . VIAGRA 100 MG tablet Take 0.5 tablets (50 mg total) by  mouth as needed for erectile dysfunction. 6 tablet 12  . diazepam (VALIUM) 2 MG tablet Take 1 tablet (2 mg total) by mouth as needed. (Patient not taking: Reported on 05/24/2015) 15 tablet 0   No current facility-administered medications for this visit.    OBJECTIVE:  Filed Vitals:   05/24/15 1514  BP: 158/79  Pulse: 87  Temp: 96.6 F (35.9 C)     Body mass index is 24.82 kg/(m^2).    ECOG FS:1 - Symptomatic but completely ambulatory  PHYSICAL EXAMgeneralstatus: Performance status is good.  Patient has not lost significant weight HEENT: No evidence of stomatitis. Sclera and conjunctivae :: No jaundice.   pale looking. Lungs: Air  entry equal on both sides.  No rhonchi.  No rales.  Cardiac: Heart sounds are normal.  No pericardial rub.  No murmur. Lymphatic system: Cervical, axillary, inguinal, lymph nodes not palpable GI: Abdomen is soft.  No ascites.  Liver spleen not palpable.  No tenderness.  Bowel sounds are within normal limit Lower extremity: No edema Neurological system: Higher functions, cranial nerves intact no evidence of peripheral neuropathy. Skin: No rash.  No ecchymosis.Marland Kitchen           LAB RESULTS:  Appointment on 05/24/2015  Component Date Value Ref Range Status  . WBC 05/24/2015 10.0  3.8 - 10.6 K/uL Final  . RBC 05/24/2015 5.09  4.40 - 5.90 MIL/uL Final  . Hemoglobin 05/24/2015 14.8  13.0 - 18.0 g/dL Final  . HCT 05/24/2015 43.8  40.0 - 52.0 % Final  . MCV 05/24/2015 86.1  80.0 - 100.0 fL Final  . MCH 05/24/2015 29.1  26.0 - 34.0 pg Final  . MCHC 05/24/2015 33.9  32.0 - 36.0 g/dL Final  . RDW 05/24/2015 15.4* 11.5 - 14.5 % Final  . Platelets 05/24/2015 222  150 - 440 K/uL Final  . Neutrophils Relative % 05/24/2015 52   Final  . Neutro Abs 05/24/2015 5.2  1.4 - 6.5 K/uL Final  . Lymphocytes Relative 05/24/2015 38   Final  . Lymphs Abs 05/24/2015 3.8* 1.0 - 3.6 K/uL Final  . Monocytes Relative 05/24/2015 9   Final  . Monocytes Absolute 05/24/2015 0.9  0.2  - 1.0 K/uL Final  . Eosinophils Relative 05/24/2015 1   Final  . Eosinophils Absolute 05/24/2015 0.1  0 - 0.7 K/uL Final  . Basophils Relative 05/24/2015 0   Final  . Basophils Absolute 05/24/2015 0.0  0 - 0.1 K/uL Final  . Sodium 05/24/2015 135  135 - 145 mmol/L Final  . Potassium 05/24/2015 3.7  3.5 - 5.1 mmol/L Final  . Chloride 05/24/2015 106  101 - 111 mmol/L Final  . CO2 05/24/2015 23  22 - 32 mmol/L Final  . Glucose, Bld 05/24/2015 100* 65 - 99 mg/dL Final  . BUN 05/24/2015 21* 6 - 20 mg/dL Final  . Creatinine,  Ser 05/24/2015 1.27* 0.61 - 1.24 mg/dL Final  . Calcium 05/24/2015 8.5* 8.9 - 10.3 mg/dL Final  . Total Protein 05/24/2015 7.3  6.5 - 8.1 g/dL Final  . Albumin 05/24/2015 4.4  3.5 - 5.0 g/dL Final  . AST 05/24/2015 23  15 - 41 U/L Final  . ALT 05/24/2015 18  17 - 63 U/L Final  . Alkaline Phosphatase 05/24/2015 77  38 - 126 U/L Final  . Total Bilirubin 05/24/2015 0.6  0.3 - 1.2 mg/dL Final  . GFR calc non Af Amer 05/24/2015 50* >60 mL/min Final  . GFR calc Af Amer 05/24/2015 59* >60 mL/min Final   Comment: (NOTE) The eGFR has been calculated using the CKD EPI equation. This calculation has not been validated in all clinical situations. eGFR's persistently <60 mL/min signify possible Chronic Kidney Disease.   . Anion gap 05/24/2015 6  5 - 15 Final  . LDH 05/24/2015 186  98 - 192 U/L Final     STUDIES: No results found.  ASSESSMENT: All lab data has been reviewed. Nodularlymphoma small cleave cell  MEDICAL DECISION MAKING:  Is no evidence of recurrent disease.  Reevaluation in one year or before if needed  Patient expressed understanding and was in agreement with this plan. He also understands that He can call clinic at any time with any questions, concerns, or complaints.    No matching staging information was found for the patient.  Forest Gleason, MD   05/25/2015 7:38 AM

## 2015-06-15 ENCOUNTER — Other Ambulatory Visit: Payer: Self-pay | Admitting: Family Medicine

## 2015-07-13 DIAGNOSIS — H4011X2 Primary open-angle glaucoma, moderate stage: Secondary | ICD-10-CM | POA: Diagnosis not present

## 2015-07-17 DIAGNOSIS — E785 Hyperlipidemia, unspecified: Secondary | ICD-10-CM | POA: Diagnosis not present

## 2015-07-17 DIAGNOSIS — I10 Essential (primary) hypertension: Secondary | ICD-10-CM | POA: Diagnosis not present

## 2015-07-17 DIAGNOSIS — M7989 Other specified soft tissue disorders: Secondary | ICD-10-CM | POA: Diagnosis not present

## 2015-07-17 DIAGNOSIS — I824Y9 Acute embolism and thrombosis of unspecified deep veins of unspecified proximal lower extremity: Secondary | ICD-10-CM | POA: Diagnosis not present

## 2015-07-17 DIAGNOSIS — I82402 Acute embolism and thrombosis of unspecified deep veins of left lower extremity: Secondary | ICD-10-CM | POA: Diagnosis not present

## 2015-07-20 DIAGNOSIS — H4011X2 Primary open-angle glaucoma, moderate stage: Secondary | ICD-10-CM | POA: Diagnosis not present

## 2015-07-23 ENCOUNTER — Ambulatory Visit (INDEPENDENT_AMBULATORY_CARE_PROVIDER_SITE_OTHER): Payer: Medicare Other | Admitting: Family Medicine

## 2015-07-23 ENCOUNTER — Encounter: Payer: Self-pay | Admitting: Family Medicine

## 2015-07-23 VITALS — BP 138/75 | HR 77 | Temp 97.9°F | Resp 16 | Ht 65.0 in | Wt 155.2 lb

## 2015-07-23 DIAGNOSIS — I10 Essential (primary) hypertension: Secondary | ICD-10-CM

## 2015-07-23 DIAGNOSIS — R600 Localized edema: Secondary | ICD-10-CM | POA: Diagnosis not present

## 2015-07-23 MED ORDER — AMLODIPINE BESYLATE 10 MG PO TABS: ORAL_TABLET | ORAL | Status: DC

## 2015-07-23 MED ORDER — FUROSEMIDE 20 MG PO TABS: 20.0000 mg | ORAL_TABLET | Freq: Every day | ORAL | Status: DC

## 2015-07-23 NOTE — Progress Notes (Signed)
Name: Manuel Reynolds   MRN: [id number]    DOB: [birth year]   Date:07/23/2015       Progress Note  Subjective  Chief Complaint  Chief Complaint  Patient presents with  . Rash    On legs. Itches. 1week.  . Leg Swelling    Vein and Vasc rules out clot. Both legs swollen with rash. 1 week    HPI  Here c/o  Bilateral leg swelling . Started about 1 week ago.  Had a rash that started with it also.  Rash has resolved.  [facility] found no clot last week.  No problem-specific assessment & plan notes found for this encounter.   Past Medical History  Diagnosis Date  . Anxiety   . Hypertension   . Depression   . Thyroid disease   . 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 Smoking status: Never Smoker   . Smokeless tobacco: Never Used  . Alcohol Use: No     Current outpatient prescriptions:  .  acebutolol (SECTRAL) 200 MG capsule, Take 1 capsule (200 mg total) by mouth daily., Disp: 30 capsule, Rfl: 12 .  amLODipine (NORVASC) 10 MG tablet, Take 1 tablet (10 mg total) by mouth daily., Disp: 30 tablet, Rfl: 12 .  COMBIGAN 0.2-0.5 % ophthalmic solution, INT 1 GTT IN OU BID, Disp: , Rfl: 5 .  diazepam (VALIUM) 2 MG tablet, Take 1 tablet (2 mg total) by mouth as needed., Disp: 15 tablet, Rfl: 0 .  latanoprost (XALATAN) 0.005 % ophthalmic solution, 1 drop at bedtime., Disp: , Rfl:  .  levothyroxine (SYNTHROID, LEVOTHROID) 50 MCG tablet, Take 1 tablet (50 mcg total) by mouth daily before breakfast., Disp: 30 tablet, Rfl: 12 .  losartan (COZAAR) 50 MG tablet, TAKE 1 TABLET BY MOUTH DAILY, Disp: 90 tablet, Rfl: 3 .  sertraline (ZOLOFT) 25 MG tablet, Take 1 tablet (25 mg total) by mouth daily., Disp: 30 tablet, Rfl: 12 .  simvastatin (ZOCOR) 40 MG tablet, Take 1 tablet (40 mg total) by mouth daily at 6 PM., Disp: 30 tablet, Rfl: 12 .  VIAGRA 100 MG tablet, Take 0.5 tablets (50 mg total) by mouth as needed for erectile  dysfunction., Disp: 6 tablet, Rfl: 12 .  Hydrocodone-Acetaminophen (VICODIN PO), Take by mouth as needed., Disp: , Rfl:   Allergies  Allergen Reactions  . Sulfa Antibiotics Rash    Review of Systems  Constitutional: Negative for fever, chills, weight loss and malaise/fatigue.  HENT: Negative for hearing loss.   Eyes: Negative for blurred vision and double vision.  Respiratory: Negative for cough, sputum production, shortness of breath and wheezing.   Cardiovascular: Positive for leg swelling. Negative for chest pain, palpitations and orthopnea.  Gastrointestinal: Negative for nausea, vomiting, abdominal pain, diarrhea and blood in stool.  Genitourinary: Negative for dysuria, urgency and frequency.  Musculoskeletal: Negative for myalgias, joint pain and falls.  Skin: Positive for rash.  Neurological: Negative for dizziness, sensory change, focal weakness, weakness and headaches.       Objective  Filed Vitals:   07/23/15 0924  BP: 138/75  Pulse: 77  Temp: 97.9 F (36.6 C)  Resp: 16  Height: '5\' 5"'  (1.651 m)  Weight: 155 lb 3.2 oz (70.398 kg)     Physical Exam  Constitutional: He is oriented to person, place, and time and well-developed, well-nourished, and in no distress. No distress.  HENT:  Head: Normocephalic and atraumatic.  Eyes: Conjunctivae and  EOM are normal. Pupils are equal, round, and reactive to light.  Neck: Normal range of motion. Neck supple. Carotid bruit is not present. No thyromegaly present.  Cardiovascular: Normal rate, regular rhythm, normal heart sounds and intact distal pulses.  Exam reveals no gallop and no friction rub.   No murmur heard. Pulmonary/Chest: Effort normal and breath sounds normal. No respiratory distress. He has no wheezes. He has no rales.  Abdominal: Soft. Bowel sounds are normal. He exhibits no distension and no mass. There is no tenderness.  Musculoskeletal: He exhibits edema (2+ pitting edema of feet ankles and 2/3 up lower  leg.).  Lymphadenopathy:    He has no cervical adenopathy.  Neurological: He is alert and oriented to person, place, and time.  Skin: Skin is warm and dry. No rash noted. No erythema.  Vitals reviewed.      Recent Results (from the past 2160 hour(s))  Comprehensive Metabolic Panel (CMET)     Status: Abnormal   Collection Time: 05/15/15  8:07 AM  Result Value Ref Range   Glucose 97 65 - 99 mg/dL   BUN 22 8 - 27 mg/dL   Creatinine, Ser 1.32 (H) 0.76 - 1.27 mg/dL   GFR calc non Af Amer 50 (L) >59 mL/min/1.73   GFR calc Af Amer 57 (L) >59 mL/min/1.73   BUN/Creatinine Ratio 17 10 - 22   Sodium 141 134 - 144 mmol/L   Potassium 4.0 3.5 - 5.2 mmol/L   Chloride 103 97 - 108 mmol/L   CO2 23 18 - 29 mmol/L   Calcium 9.2 8.6 - 10.2 mg/dL   Total Protein 6.4 6.0 - 8.5 g/dL   Albumin 4.3 3.5 - 4.7 g/dL   Globulin, Total 2.1 1.5 - 4.5 g/dL   Albumin/Globulin Ratio 2.0 1.1 - 2.5   Bilirubin Total 0.6 0.0 - 1.2 mg/dL   Alkaline Phosphatase 66 39 - 117 IU/L   AST 18 0 - 40 IU/L   ALT 14 0 - 44 IU/L  CBC with Differential     Status: None   Collection Time: 05/15/15  8:07 AM  Result Value Ref Range   WBC 7.7 3.4 - 10.8 x10E3/uL   RBC 4.96 4.14 - 5.80 x10E6/uL   Hemoglobin 14.7 12.6 - 17.7 g/dL   Hematocrit 41.5 37.5 - 51.0 %   MCV 84 79 - 97 fL   MCH 29.6 26.6 - 33.0 pg   MCHC 35.4 31.5 - 35.7 g/dL   RDW 15.0 12.3 - 15.4 %   Platelets 249 150 - 379 x10E3/uL   Neutrophils 50 %   Lymphs 38 %   Monocytes 11 %   Eos 1 %   Basos 0 %   Neutrophils Absolute 3.8 1.4 - 7.0 x10E3/uL   Lymphocytes Absolute 2.9 0.7 - 3.1 x10E3/uL   Monocytes Absolute 0.9 0.1 - 0.9 x10E3/uL   EOS (ABSOLUTE) 0.1 0.0 - 0.4 x10E3/uL   Basophils Absolute 0.0 0.0 - 0.2 x10E3/uL   Immature Granulocytes 0 %   Immature Grans (Abs) 0.0 0.0 - 0.1 x10E3/uL  Lipid Profile     Status: Abnormal   Collection Time: 05/15/15  8:07 AM  Result Value Ref Range   Cholesterol, Total 194 100 - 199 mg/dL   Triglycerides 131 0  - 149 mg/dL   HDL 35 (L) >39 mg/dL    Comment: According to ATP-III Guidelines, HDL-C >59 mg/dL is considered a negative risk factor for CHD.    VLDL Cholesterol Cal 26 5 - 40  mg/dL   LDL Calculated 133 (H) 0 - 99 mg/dL   Chol/HDL Ratio 5.5 (H) 0.0 - 5.0 ratio units    Comment:                                   T. Chol/HDL Ratio                                             Men  Women                               1/2 Avg.Risk  3.4    3.3                                   Avg.Risk  5.0    4.4                                2X Avg.Risk  9.6    7.1                                3X Avg.Risk 23.4   11.0   TSH     Status: None   Collection Time: 05/15/15  8:07 AM  Result Value Ref Range   TSH 2.480 0.450 - 4.500 uIU/mL  CBC with Differential     Status: Abnormal   Collection Time: 05/24/15  2:44 PM  Result Value Ref Range   WBC 10.0 3.8 - 10.6 K/uL   RBC 5.09 4.40 - 5.90 MIL/uL   Hemoglobin 14.8 13.0 - 18.0 g/dL   HCT 43.8 40.0 - 52.0 %   MCV 86.1 80.0 - 100.0 fL   MCH 29.1 26.0 - 34.0 pg   MCHC 33.9 32.0 - 36.0 g/dL   RDW 15.4 (H) 11.5 - 14.5 %   Platelets 222 150 - 440 K/uL   Neutrophils Relative % 52 %   Neutro Abs 5.2 1.4 - 6.5 K/uL   Lymphocytes Relative 38 %   Lymphs Abs 3.8 (H) 1.0 - 3.6 K/uL   Monocytes Relative 9 %   Monocytes Absolute 0.9 0.2 - 1.0 K/uL   Eosinophils Relative 1 %   Eosinophils Absolute 0.1 0 - 0.7 K/uL   Basophils Relative 0 %   Basophils Absolute 0.0 0 - 0.1 K/uL  Comprehensive metabolic panel     Status: Abnormal   Collection Time: 05/24/15  2:44 PM  Result Value Ref Range   Sodium 135 135 - 145 mmol/L   Potassium 3.7 3.5 - 5.1 mmol/L   Chloride 106 101 - 111 mmol/L   CO2 23 22 - 32 mmol/L   Glucose, Bld 100 (H) 65 - 99 mg/dL   BUN 21 (H) 6 - 20 mg/dL   Creatinine, Ser 1.27 (H) 0.61 - 1.24 mg/dL   Calcium 8.5 (L) 8.9 - 10.3 mg/dL   Total Protein 7.3 6.5 - 8.1 g/dL   Albumin 4.4 3.5 - 5.0 g/dL   AST 23 15 - 41 U/L   ALT 18 17 - 63 U/L    Alkaline Phosphatase 77  38 - 126 U/L   Total Bilirubin 0.6 0.3 - 1.2 mg/dL   GFR calc non Af Amer 50 (L) >60 mL/min   GFR calc Af Amer 59 (L) >60 mL/min    Comment: (NOTE) The eGFR has been calculated using the CKD EPI equation. This calculation has not been validated in all clinical situations. eGFR's persistently <60 mL/min signify possible Chronic Kidney Disease.    Anion gap 6 5 - 15  Lactate dehydrogenase     Status: None   Collection Time: 05/24/15  2:44 PM  Result Value Ref Range   LDH 186 98 - 192 U/L     Assessment & Plan  1. Pedal edema  - furosemide (LASIX) 20 MG tablet; Take 1 tablet (20 mg total) by mouth daily.  Dispense: 7 tablet; Refill: 2  2. Essential hypertension  - amLODipine (NORVASC) 10 MG tablet; Take 1/2 tablet by mouth each morning for BP.  Dispense: 30 tablet; Refill: 12

## 2015-07-23 NOTE — Patient Instructions (Signed)
Watch fluid in legs.

## 2015-07-27 DIAGNOSIS — D1801 Hemangioma of skin and subcutaneous tissue: Secondary | ICD-10-CM | POA: Diagnosis not present

## 2015-07-27 DIAGNOSIS — D485 Neoplasm of uncertain behavior of skin: Secondary | ICD-10-CM | POA: Diagnosis not present

## 2015-07-27 DIAGNOSIS — L821 Other seborrheic keratosis: Secondary | ICD-10-CM | POA: Diagnosis not present

## 2015-07-27 DIAGNOSIS — X32XXXA Exposure to sunlight, initial encounter: Secondary | ICD-10-CM | POA: Diagnosis not present

## 2015-07-27 DIAGNOSIS — Z85828 Personal history of other malignant neoplasm of skin: Secondary | ICD-10-CM | POA: Diagnosis not present

## 2015-07-27 DIAGNOSIS — L570 Actinic keratosis: Secondary | ICD-10-CM | POA: Diagnosis not present

## 2015-07-27 DIAGNOSIS — C44329 Squamous cell carcinoma of skin of other parts of face: Secondary | ICD-10-CM | POA: Diagnosis not present

## 2015-08-17 ENCOUNTER — Ambulatory Visit (INDEPENDENT_AMBULATORY_CARE_PROVIDER_SITE_OTHER): Payer: Medicare Other | Admitting: Family Medicine

## 2015-08-17 ENCOUNTER — Encounter: Payer: Self-pay | Admitting: Family Medicine

## 2015-08-17 VITALS — BP 172/90 | HR 71 | Temp 98.5°F | Resp 16 | Ht 65.0 in | Wt 155.0 lb

## 2015-08-17 DIAGNOSIS — R600 Localized edema: Secondary | ICD-10-CM | POA: Diagnosis not present

## 2015-08-17 DIAGNOSIS — Z23 Encounter for immunization: Secondary | ICD-10-CM | POA: Diagnosis not present

## 2015-08-17 DIAGNOSIS — I10 Essential (primary) hypertension: Secondary | ICD-10-CM | POA: Diagnosis not present

## 2015-08-17 MED ORDER — AMLODIPINE BESYLATE 5 MG PO TABS: 5.0000 mg | ORAL_TABLET | Freq: Every day | ORAL | Status: DC

## 2015-08-17 MED ORDER — SPIRONOLACTONE 25 MG PO TABS: 25.0000 mg | ORAL_TABLET | Freq: Every day | ORAL | Status: DC

## 2015-08-17 NOTE — Patient Instructions (Signed)
Plan BMP on return

## 2015-08-17 NOTE — Progress Notes (Signed)
Name: Manuel Reynolds   MRN: [id number]    DOB: 09/21/1930   Date:08/17/2015       Progress Note  Subjective  Chief Complaint  Chief Complaint  Patient presents with  . Edema    Patient here for 1 mos f/u htn/edema.  . Hypertension    HPI  Here for f/u of HBP.  Has more edema recently.   Legs are swelling more in the day.   Feeling well overall.   No problem-specific assessment & plan notes found for this encounter.   Past Medical History  Diagnosis Date  . Anxiety   . Hypertension   . Depression   . Thyroid disease   . 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 Smoking status: Never Smoker   . Smokeless tobacco: Never Used  . Alcohol Use: No     Current outpatient prescriptions:  .  acebutolol (SECTRAL) 200 MG capsule, Take 1 capsule (200 mg total) by mouth daily., Disp: 30 capsule, Rfl: 12 .  amLODipine (NORVASC) 10 MG tablet, Take 1/2 tablet by mouth each morning for BP., Disp: 30 tablet, Rfl: 12 .  COMBIGAN 0.2-0.5 % ophthalmic solution, INT 1 GTT IN OU BID, Disp: , Rfl: 5 .  diazepam (VALIUM) 2 MG tablet, Take 1 tablet (2 mg total) by mouth as needed., Disp: 15 tablet, Rfl: 0 .  furosemide (LASIX) 20 MG tablet, Take 1 tablet (20 mg total) by mouth daily., Disp: 7 tablet, Rfl: 2 .  Hydrocodone-Acetaminophen (VICODIN PO), Take by mouth as needed., Disp: , Rfl:  .  latanoprost (XALATAN) 0.005 % ophthalmic solution, 1 drop at bedtime., Disp: , Rfl:  .  levothyroxine (SYNTHROID, LEVOTHROID) 50 MCG tablet, Take 1 tablet (50 mcg total) by mouth daily before breakfast., Disp: 30 tablet, Rfl: 12 .  losartan (COZAAR) 50 MG tablet, TAKE 1 TABLET BY MOUTH DAILY, Disp: 90 tablet, Rfl: 3 .  sertraline (ZOLOFT) 25 MG tablet, Take 1 tablet (25 mg total) by mouth daily., Disp: 30 tablet, Rfl: 12 .  simvastatin (ZOCOR) 40 MG tablet, Take 1 tablet (40 mg total) by mouth daily at 6 PM., Disp: 30 tablet, Rfl: 12 .  VIAGRA 100 MG tablet,  Take 0.5 tablets (50 mg total) by mouth as needed for erectile dysfunction., Disp: 6 tablet, Rfl: 12  Allergies  Allergen Reactions  . Sulfa Antibiotics Rash    Review of Systems  Constitutional: Negative for fever, chills, weight loss and malaise/fatigue.  HENT: Negative for hearing loss.   Eyes: Negative for blurred vision and double vision.  Respiratory: Negative for cough, hemoptysis, sputum production, shortness of breath and wheezing.   Cardiovascular: Positive for leg swelling. Negative for chest pain, palpitations and PND.  Gastrointestinal: Negative for heartburn, abdominal pain and blood in stool.  Genitourinary: Negative for dysuria, urgency and frequency.  Musculoskeletal:       Occ. R flank pain.  Neurological: Negative for weakness and headaches.      Objective  Filed Vitals:   08/17/15 1541  BP: 172/90  Pulse: 71  Temp: 98.5 F (36.9 C)  TempSrc: Oral  Resp: 16  Height: '5\' 5"'  (1.651 m)  Weight: 155 lb (70.308 kg)     Physical Exam  Constitutional: He is oriented to person, place, and time and well-developed, well-nourished, and in no distress. No distress.  HENT:  Head: Normocephalic and atraumatic.  Eyes: Conjunctivae and EOM are normal. Pupils are equal, round, and reactive to light.  No scleral icterus.  Neck: Normal range of motion. No thyromegaly present.  Cardiovascular: Normal rate, regular rhythm, normal heart sounds and intact distal pulses.  Exam reveals no gallop and no friction rub.   No murmur heard. Pulmonary/Chest: Effort normal and breath sounds normal. No respiratory distress. He has no wheezes. He has no rales.  Musculoskeletal: He exhibits edema (1+ edema).  Lymphadenopathy:    He has no cervical adenopathy.  Neurological: He is alert and oriented to person, place, and time.  Vitals reviewed.     Recent Results (from the past 2160 hour(s))  CBC with Differential     Status: Abnormal   Collection Time: 05/24/15  2:44 PM  Result  Value Ref Range   WBC 10.0 3.8 - 10.6 K/uL   RBC 5.09 4.40 - 5.90 MIL/uL   Hemoglobin 14.8 13.0 - 18.0 g/dL   HCT 43.8 40.0 - 52.0 %   MCV 86.1 80.0 - 100.0 fL   MCH 29.1 26.0 - 34.0 pg   MCHC 33.9 32.0 - 36.0 g/dL   RDW 15.4 (H) 11.5 - 14.5 %   Platelets 222 150 - 440 K/uL   Neutrophils Relative % 52 %   Neutro Abs 5.2 1.4 - 6.5 K/uL   Lymphocytes Relative 38 %   Lymphs Abs 3.8 (H) 1.0 - 3.6 K/uL   Monocytes Relative 9 %   Monocytes Absolute 0.9 0.2 - 1.0 K/uL   Eosinophils Relative 1 %   Eosinophils Absolute 0.1 0 - 0.7 K/uL   Basophils Relative 0 %   Basophils Absolute 0.0 0 - 0.1 K/uL  Comprehensive metabolic panel     Status: Abnormal   Collection Time: 05/24/15  2:44 PM  Result Value Ref Range   Sodium 135 135 - 145 mmol/L   Potassium 3.7 3.5 - 5.1 mmol/L   Chloride 106 101 - 111 mmol/L   CO2 23 22 - 32 mmol/L   Glucose, Bld 100 (H) 65 - 99 mg/dL   BUN 21 (H) 6 - 20 mg/dL   Creatinine, Ser 1.27 (H) 0.61 - 1.24 mg/dL   Calcium 8.5 (L) 8.9 - 10.3 mg/dL   Total Protein 7.3 6.5 - 8.1 g/dL   Albumin 4.4 3.5 - 5.0 g/dL   AST 23 15 - 41 U/L   ALT 18 17 - 63 U/L   Alkaline Phosphatase 77 38 - 126 U/L   Total Bilirubin 0.6 0.3 - 1.2 mg/dL   GFR calc non Af Amer 50 (L) >60 mL/min   GFR calc Af Amer 59 (L) >60 mL/min    Comment: (NOTE) The eGFR has been calculated using the CKD EPI equation. This calculation has not been validated in all clinical situations. eGFR's persistently <60 mL/min signify possible Chronic Kidney Disease.    Anion gap 6 5 - 15  Lactate dehydrogenase     Status: None   Collection Time: 05/24/15  2:44 PM  Result Value Ref Range   LDH 186 98 - 192 U/L     Assessment & Plan  1. Need for influenza vaccination  - Flu vaccine HIGH DOSE PF (Fluzone High dose)  2. Essential hypertension  - amLODipine (NORVASC) 5 MG tablet; Take 1 tablet (5 mg total) by mouth daily.  Dispense: 30 tablet; Refill: 12 - spironolactone (ALDACTONE) 25 MG tablet; Take  1 tablet (25 mg total) by mouth daily.  Dispense: 90 tablet; Refill: 3  3. Pedal edema  - spironolactone (ALDACTONE) 25 MG tablet; Take 1 tablet (25 mg total)  by mouth daily.  Dispense: 90 tablet; Refill: 3

## 2015-08-24 ENCOUNTER — Ambulatory Visit: Payer: Medicare Other | Admitting: Family Medicine

## 2015-09-17 ENCOUNTER — Encounter: Payer: Self-pay | Admitting: Family Medicine

## 2015-09-17 ENCOUNTER — Other Ambulatory Visit: Payer: Self-pay

## 2015-09-17 ENCOUNTER — Ambulatory Visit (INDEPENDENT_AMBULATORY_CARE_PROVIDER_SITE_OTHER): Payer: Medicare Other | Admitting: Family Medicine

## 2015-09-17 VITALS — BP 140/80 | HR 76 | Temp 97.8°F | Resp 16 | Ht 66.0 in | Wt 150.6 lb

## 2015-09-17 DIAGNOSIS — R600 Localized edema: Secondary | ICD-10-CM | POA: Diagnosis not present

## 2015-09-17 DIAGNOSIS — F419 Anxiety disorder, unspecified: Secondary | ICD-10-CM

## 2015-09-17 DIAGNOSIS — E038 Other specified hypothyroidism: Secondary | ICD-10-CM | POA: Diagnosis not present

## 2015-09-17 DIAGNOSIS — E7800 Pure hypercholesterolemia, unspecified: Secondary | ICD-10-CM | POA: Diagnosis not present

## 2015-09-17 DIAGNOSIS — E034 Atrophy of thyroid (acquired): Secondary | ICD-10-CM

## 2015-09-17 DIAGNOSIS — C8290 Follicular lymphoma, unspecified, unspecified site: Secondary | ICD-10-CM | POA: Diagnosis not present

## 2015-09-17 DIAGNOSIS — I10 Essential (primary) hypertension: Secondary | ICD-10-CM

## 2015-09-17 NOTE — Progress Notes (Signed)
Name: Manuel Reynolds   MRN: [id number]    DOB: 1930-08-28   Date:09/17/2015       Progress Note  Subjective  Chief Complaint  Chief Complaint  Patient presents with  . Leg Swelling    FOLLOW UP   . Diverticulitis    One severe episode requiring medication.  Had colonoscopy  >10 yrs ago.    HPI Here for f/u of pedal edema, HBP, hypothyroidism, Lymphoma, anziety.  All doing well  Lymphoma in remission.  States that he has an cc. L. Sided abd pain that is "dierticulitis", but it resolves the same day.  No severe sx. in > 5 yrs.  No problem-specific assessment & plan notes found for this encounter.   Past Medical History  Diagnosis Date  . Anxiety   . Hypertension   . Depression   . Thyroid disease   . Lymphoma malignant, nodular, lymphocytic ([place]) 05/25/2015    Social History  Substance Use Topics  . Smoking status: Never Smoker   . Smokeless tobacco: Never Used  . Alcohol Use: No     Current outpatient prescriptions:  .  acebutolol (SECTRAL) 200 MG capsule, Take 1 capsule (200 mg total) by mouth daily., Disp: 30 capsule, Rfl: 12 .  amLODipine (NORVASC) 5 MG tablet, Take 1 tablet (5 mg total) by mouth daily., Disp: 30 tablet, Rfl: 12 .  COMBIGAN 0.2-0.5 % ophthalmic solution, INT 1 GTT IN OU BID, Disp: , Rfl: 5 .  diazepam (VALIUM) 2 MG tablet, Take 1 tablet (2 mg total) by mouth as needed., Disp: 15 tablet, Rfl: 0 .  furosemide (LASIX) 20 MG tablet, , Disp: , Rfl: 2 .  Hydrocodone-Acetaminophen (VICODIN PO), Take by mouth as needed., Disp: , Rfl:  .  latanoprost (XALATAN) 0.005 % ophthalmic solution, 1 drop at bedtime., Disp: , Rfl:  .  levothyroxine (SYNTHROID, LEVOTHROID) 50 MCG tablet, Take 1 tablet (50 mcg total) by mouth daily before breakfast., Disp: 30 tablet, Rfl: 12 .  losartan (COZAAR) 50 MG tablet, TAKE 1 TABLET BY MOUTH DAILY, Disp: 90 tablet, Rfl: 3 .  sertraline (ZOLOFT) 25 MG tablet, Take 1 tablet (25 mg total) by mouth daily., Disp: 30 tablet, Rfl: 12 .   simvastatin (ZOCOR) 40 MG tablet, Take 1 tablet (40 mg total) by mouth daily at 6 PM., Disp: 30 tablet, Rfl: 12 .  spironolactone (ALDACTONE) 25 MG tablet, Take 1 tablet (25 mg total) by mouth daily., Disp: 90 tablet, Rfl: 3 .  VIAGRA 100 MG tablet, Take 0.5 tablets (50 mg total) by mouth as needed for erectile dysfunction., Disp: 6 tablet, Rfl: 12  Allergies  Allergen Reactions  . Sulfa Antibiotics Rash    Review of Systems  Constitutional: Negative for fever, chills, weight loss and malaise/fatigue.  HENT: Negative for hearing loss.   Eyes: Negative for blurred vision and double vision.  Respiratory: Negative for cough, shortness of breath and wheezing.   Cardiovascular: Negative for chest pain, palpitations and leg swelling.  Gastrointestinal: Negative for heartburn, abdominal pain, blood in stool and melena.  Genitourinary: Negative for dysuria, urgency and frequency.  Musculoskeletal: Negative for myalgias and joint pain.  Skin: Negative for rash.  Neurological: Negative for dizziness, tremors, weakness and headaches.      Objective  Filed Vitals:   09/17/15 1040  BP: 159/84  Pulse: 76  Temp: 97.8 F (36.6 C)  Resp: 16  Height: 5\' 6"  (1.676 m)  Weight: 150 lb 9.6 oz (68.312 kg)     Physical  Exam  Constitutional: He is oriented to person, place, and time. No distress.  HENT:  Head: Normocephalic and atraumatic.  Neck: Normal range of motion. Neck supple. Carotid bruit is not present. No thyromegaly present.  Cardiovascular: Normal rate, regular rhythm and intact distal pulses.  Exam reveals no gallop and no friction rub.   Murmur heard.  Systolic murmur is present with a grade of 2/6  LLSB  Pulmonary/Chest: Effort normal and breath sounds normal. No respiratory distress. He has no wheezes. He has no rales.  Abdominal: Soft. He exhibits no distension and no mass. There is no tenderness.  Musculoskeletal: He exhibits no edema.  Lymphadenopathy:    He has no  cervical adenopathy.  Neurological: He is alert and oriented to person, place, and time.  Vitals reviewed.     No results found for this or any previous visit (from the past 2160 hour(s)).   Assessment & Plan  1. Essential hypertension  - Basic Metabolic Panel (BMET)  2. Hypothyroidism due to acquired atrophy of thyroid   3. Elevated cholesterol   4. Acute anxiety   5. Follicular lymphoma, unspecified body region, unspecified grade ([place])   6. Pedal edema

## 2015-09-17 NOTE — Patient Instructions (Addendum)
Had flu shot already.  Pedal edema has resolved.  Continue current meds.

## 2015-09-20 DIAGNOSIS — I10 Essential (primary) hypertension: Secondary | ICD-10-CM | POA: Diagnosis not present

## 2015-09-21 LAB — BASIC METABOLIC PANEL
BUN/Creatinine Ratio: 18 (ref 10–22)
BUN: 20 mg/dL (ref 8–27)
CO2: 19 mmol/L (ref 18–29)
Calcium: 9.1 mg/dL (ref 8.6–10.2)
Chloride: 102 mmol/L (ref 97–106)
Creatinine, Ser: 1.13 mg/dL (ref 0.76–1.27)
GFR calc Af Amer: 69 mL/min/{1.73_m2} (ref >59)
GFR calc non Af Amer: 60 mL/min/{1.73_m2} (ref >59)
Glucose: 93 mg/dL (ref 65–99)
Potassium: 4.4 mmol/L (ref 3.5–5.2)
Sodium: 141 mmol/L (ref 136–144)

## 2015-09-21 NOTE — Progress Notes (Signed)
Advised.JH  

## 2015-10-03 DIAGNOSIS — L908 Other atrophic disorders of skin: Secondary | ICD-10-CM | POA: Diagnosis not present

## 2015-10-03 DIAGNOSIS — L578 Other skin changes due to chronic exposure to nonionizing radiation: Secondary | ICD-10-CM | POA: Diagnosis not present

## 2015-10-03 DIAGNOSIS — L905 Scar conditions and fibrosis of skin: Secondary | ICD-10-CM | POA: Diagnosis not present

## 2015-10-03 DIAGNOSIS — C44329 Squamous cell carcinoma of skin of other parts of face: Secondary | ICD-10-CM | POA: Diagnosis not present

## 2016-01-17 DIAGNOSIS — H401132 Primary open-angle glaucoma, bilateral, moderate stage: Secondary | ICD-10-CM | POA: Diagnosis not present

## 2016-01-17 DIAGNOSIS — H35343 Macular cyst, hole, or pseudohole, bilateral: Secondary | ICD-10-CM | POA: Diagnosis not present

## 2016-03-17 ENCOUNTER — Ambulatory Visit (INDEPENDENT_AMBULATORY_CARE_PROVIDER_SITE_OTHER): Payer: Medicare Other | Admitting: Family Medicine

## 2016-03-17 ENCOUNTER — Encounter: Payer: Self-pay | Admitting: Family Medicine

## 2016-03-17 VITALS — BP 145/85 | HR 76 | Temp 97.6°F | Resp 16 | Ht 66.0 in | Wt 158.0 lb

## 2016-03-17 DIAGNOSIS — I10 Essential (primary) hypertension: Secondary | ICD-10-CM

## 2016-03-17 DIAGNOSIS — F419 Anxiety disorder, unspecified: Secondary | ICD-10-CM | POA: Diagnosis not present

## 2016-03-17 DIAGNOSIS — E034 Atrophy of thyroid (acquired): Secondary | ICD-10-CM | POA: Diagnosis not present

## 2016-03-17 DIAGNOSIS — C8200 Follicular lymphoma grade I, unspecified site: Secondary | ICD-10-CM

## 2016-03-17 DIAGNOSIS — E038 Other specified hypothyroidism: Secondary | ICD-10-CM | POA: Diagnosis not present

## 2016-03-17 DIAGNOSIS — E7800 Pure hypercholesterolemia, unspecified: Secondary | ICD-10-CM | POA: Diagnosis not present

## 2016-03-17 NOTE — Progress Notes (Signed)
Name: Manuel Reynolds   MRN: [id number]    DOB: [birth year]   Date:03/17/2016       Progress Note  Subjective  Chief Complaint  Chief Complaint  Patient presents with  . Hypertension  . follicular lymphoma    f/u    HPI Here for f/u of HBP.  Also has follicular Lymphoma, followed by Dr. Jeb Levering.Marland Kitchen  He is feeling fairly well.  No problem-specific assessment & plan notes found for this encounter.   Past Medical History  Diagnosis Date  . Anxiety   . Hypertension   . Depression   . Thyroid disease   . Lymphoma malignant, nodular, lymphocytic ([place]) 05/25/2015    Past Surgical History  Procedure Laterality Date  . Eye surgery  2010    cataract    Family History  Problem Relation Age of Onset  . Hypertension Father   . Cancer Sister     Social History   Social History  . Marital Status: Married    Spouse Name: N/A  . Number of Children: N/A  . Years of Education: N/A   Occupational History  . Not on file.   Social History Main Topics  . Smoking status: Never Smoker   . Smokeless tobacco: Never Used  . Alcohol Use: No  . Drug Use: No  . Sexual Activity: Not on file   Other Topics Concern  . Not on file   Social History Narrative     Current outpatient prescriptions:  .  acebutolol (SECTRAL) 200 MG capsule, Take 1 capsule (200 mg total) by mouth daily., Disp: 30 capsule, Rfl: 12 .  amLODipine (NORVASC) 5 MG tablet, Take 1 tablet (5 mg total) by mouth daily., Disp: 30 tablet, Rfl: 12 .  COMBIGAN 0.2-0.5 % ophthalmic solution, INT 1 GTT IN OU BID, Disp: , Rfl: 5 .  diazepam (VALIUM) 2 MG tablet, Take 1 tablet (2 mg total) by mouth as needed., Disp: 15 tablet, Rfl: 0 .  latanoprost (XALATAN) 0.005 % ophthalmic solution, 1 drop at bedtime., Disp: , Rfl:  .  levothyroxine (SYNTHROID, LEVOTHROID) 50 MCG tablet, Take 1 tablet (50 mcg total) by mouth daily before breakfast., Disp: 30 tablet, Rfl: 12 .  losartan (COZAAR) 50 MG tablet, TAKE 1 TABLET BY MOUTH DAILY, Disp:  90 tablet, Rfl: 3 .  sertraline (ZOLOFT) 25 MG tablet, Take 1 tablet (25 mg total) by mouth daily., Disp: 30 tablet, Rfl: 12 .  simvastatin (ZOCOR) 40 MG tablet, Take 1 tablet (40 mg total) by mouth daily at 6 PM., Disp: 30 tablet, Rfl: 12 .  spironolactone (ALDACTONE) 25 MG tablet, Take 1 tablet (25 mg total) by mouth daily., Disp: 90 tablet, Rfl: 3 .  VIAGRA 100 MG tablet, Take 0.5 tablets (50 mg total) by mouth as needed for erectile dysfunction., Disp: 6 tablet, Rfl: 12  Allergies  Allergen Reactions  . Sulfa Antibiotics Rash     Review of Systems  Constitutional: Negative for fever, chills, weight loss and malaise/fatigue.  HENT: Negative for hearing loss.   Eyes: Negative for blurred vision and double vision.  Respiratory: Negative for cough, shortness of breath and wheezing.   Cardiovascular: Negative for chest pain, palpitations and leg swelling.  Gastrointestinal: Negative for heartburn, abdominal pain and blood in stool.  Genitourinary: Negative for dysuria, urgency and frequency.  Musculoskeletal: Negative for myalgias and joint pain.  Skin: Negative for rash.  Neurological: Negative for tremors, weakness and headaches.      Objective  Filed Vitals:  03/17/16 1100 03/17/16 1144  BP: 178/91 145/85  Pulse: 76   Temp: 97.6 F (36.4 C)   TempSrc: Oral   Resp: 16   Height: 5\' 6"  (1.676 m)   Weight: 158 lb (71.668 kg)     Physical Exam  Constitutional: He is oriented to person, place, and time and well-developed, well-nourished, and in no distress. No distress.  HENT:  Head: Normocephalic and atraumatic.  Eyes: Conjunctivae are normal. Pupils are equal, round, and reactive to light. No scleral icterus.  Neck: Normal range of motion. Neck supple. Carotid bruit is not present. No thyromegaly present.  Cardiovascular: Normal rate, regular rhythm and normal heart sounds.  Exam reveals no gallop and no friction rub.   No murmur heard. Pulmonary/Chest: Effort normal  and breath sounds normal. No respiratory distress. He has no wheezes. He has no rales.  Musculoskeletal: He exhibits edema (trace bilateral pedal edema.).  Lymphadenopathy:    He has no cervical adenopathy.  Neurological: He is alert and oriented to person, place, and time.  Vitals reviewed.      No results found for this or any previous visit (from the past 2160 hour(s)).   Assessment & Plan  Problem List Items Addressed This Visit      Cardiovascular and Mediastinum   Hypertension - Primary   Relevant Orders   Comprehensive Metabolic Panel (CMET)     Endocrine   Hypothyroidism   Relevant Orders   TSH     Other   Elevated cholesterol   Relevant Orders   Lipid Profile   Acute anxiety   Lymphoma malignant, nodular, lymphocytic ([place])   Relevant Orders   CBC with Differential      No orders of the defined types were placed in this encounter.   1. Essential hypertension Cont Sectral, Amlodipine, Losartan, Spironalactone - Comprehensive Metabolic Panel (CMET)  2. Hypothyroidism due to acquired atrophy of thyroid Cont. Levothyroxine. - TSH  3. Elevated cholesterol Cont. Simvastatin - Lipid Profile  4. Follicular lymphoma grade I, unspecified body region ([place])  - CBC with Differential  5. Acute anxiety Rare, use

## 2016-03-21 DIAGNOSIS — E038 Other specified hypothyroidism: Secondary | ICD-10-CM | POA: Diagnosis not present

## 2016-03-21 DIAGNOSIS — I10 Essential (primary) hypertension: Secondary | ICD-10-CM | POA: Diagnosis not present

## 2016-03-21 DIAGNOSIS — E034 Atrophy of thyroid (acquired): Secondary | ICD-10-CM | POA: Diagnosis not present

## 2016-03-21 DIAGNOSIS — C8200 Follicular lymphoma grade I, unspecified site: Secondary | ICD-10-CM | POA: Diagnosis not present

## 2016-03-21 DIAGNOSIS — E7800 Pure hypercholesterolemia, unspecified: Secondary | ICD-10-CM | POA: Diagnosis not present

## 2016-03-22 LAB — COMPREHENSIVE METABOLIC PANEL
ALT: 16 IU/L (ref 0–44)
AST: 26 IU/L (ref 0–40)
Albumin/Globulin Ratio: 1.8 (ref 1.2–2.2)
Albumin: 4.1 g/dL (ref 3.5–4.7)
Alkaline Phosphatase: 68 IU/L (ref 39–117)
BUN/Creatinine Ratio: 18 (ref 10–24)
BUN: 24 mg/dL (ref 8–27)
Bilirubin Total: 0.5 mg/dL (ref 0.0–1.2)
CO2: 19 mmol/L (ref 18–29)
Calcium: 8.9 mg/dL (ref 8.6–10.2)
Chloride: 101 mmol/L (ref 96–106)
Creatinine, Ser: 1.36 mg/dL — ABNORMAL HIGH (ref 0.76–1.27)
GFR calc Af Amer: 55 mL/min/{1.73_m2} — ABNORMAL LOW (ref >59)
GFR calc non Af Amer: 47 mL/min/{1.73_m2} — ABNORMAL LOW (ref >59)
Globulin, Total: 2.3 g/dL (ref 1.5–4.5)
Glucose: 85 mg/dL (ref 65–99)
Potassium: 4.9 mmol/L (ref 3.5–5.2)
Sodium: 141 mmol/L (ref 134–144)
Total Protein: 6.4 g/dL (ref 6.0–8.5)

## 2016-03-22 LAB — CBC WITH DIFFERENTIAL/PLATELET
Basophils Absolute: 0 10*3/uL (ref 0.0–0.2)
Basos: 0 %
EOS (ABSOLUTE): 0.1 10*3/uL (ref 0.0–0.4)
Eos: 1 %
Hematocrit: 42.9 % (ref 37.5–51.0)
Hemoglobin: 15.2 g/dL (ref 12.6–17.7)
Immature Grans (Abs): 0 10*3/uL (ref 0.0–0.1)
Immature Granulocytes: 0 %
Lymphocytes Absolute: 2.7 10*3/uL (ref 0.7–3.1)
Lymphs: 35 %
MCH: 30.7 pg (ref 26.6–33.0)
MCHC: 35.4 g/dL (ref 31.5–35.7)
MCV: 87 fL (ref 79–97)
Monocytes Absolute: 0.9 10*3/uL (ref 0.1–0.9)
Monocytes: 11 %
Neutrophils Absolute: 4.2 10*3/uL (ref 1.4–7.0)
Neutrophils: 53 %
Platelets: 249 10*3/uL (ref 150–379)
RBC: 4.95 x10E6/uL (ref 4.14–5.80)
RDW: 15.4 % (ref 12.3–15.4)
WBC: 7.9 10*3/uL (ref 3.4–10.8)

## 2016-03-22 LAB — LIPID PANEL
Chol/HDL Ratio: 5.1 ratio units — ABNORMAL HIGH (ref 0.0–5.0)
Cholesterol, Total: 169 mg/dL (ref 100–199)
HDL: 33 mg/dL — ABNORMAL LOW (ref >39)
LDL Calculated: 105 mg/dL — ABNORMAL HIGH (ref 0–99)
Triglycerides: 155 mg/dL — ABNORMAL HIGH (ref 0–149)
VLDL Cholesterol Cal: 31 mg/dL (ref 5–40)

## 2016-03-22 LAB — TSH: TSH: 5.02 u[IU]/mL — ABNORMAL HIGH (ref 0.450–4.500)

## 2016-03-26 LAB — T4: T4, Total: 9.9 ug/dL (ref 4.5–12.0)

## 2016-03-26 LAB — T3: T3, Total: 113 ng/dL (ref 71–180)

## 2016-03-26 LAB — SPECIMEN STATUS REPORT

## 2016-04-25 DIAGNOSIS — L218 Other seborrheic dermatitis: Secondary | ICD-10-CM | POA: Diagnosis not present

## 2016-04-25 DIAGNOSIS — Z85828 Personal history of other malignant neoplasm of skin: Secondary | ICD-10-CM | POA: Diagnosis not present

## 2016-04-25 DIAGNOSIS — Z08 Encounter for follow-up examination after completed treatment for malignant neoplasm: Secondary | ICD-10-CM | POA: Diagnosis not present

## 2016-04-25 DIAGNOSIS — L821 Other seborrheic keratosis: Secondary | ICD-10-CM | POA: Diagnosis not present

## 2016-04-25 DIAGNOSIS — D485 Neoplasm of uncertain behavior of skin: Secondary | ICD-10-CM | POA: Diagnosis not present

## 2016-04-25 DIAGNOSIS — L570 Actinic keratosis: Secondary | ICD-10-CM | POA: Diagnosis not present

## 2016-04-25 DIAGNOSIS — X32XXXA Exposure to sunlight, initial encounter: Secondary | ICD-10-CM | POA: Diagnosis not present

## 2016-05-10 ENCOUNTER — Other Ambulatory Visit: Payer: Self-pay | Admitting: Family Medicine

## 2016-05-26 ENCOUNTER — Other Ambulatory Visit: Payer: Self-pay

## 2016-05-26 ENCOUNTER — Encounter: Payer: Self-pay | Admitting: Hematology and Oncology

## 2016-05-26 ENCOUNTER — Inpatient Hospital Stay: Payer: Medicare Other | Attending: Hematology and Oncology

## 2016-05-26 ENCOUNTER — Ambulatory Visit: Payer: Federal, State, Local not specified - PPO | Admitting: Oncology

## 2016-05-26 ENCOUNTER — Inpatient Hospital Stay (HOSPITAL_BASED_OUTPATIENT_CLINIC_OR_DEPARTMENT_OTHER): Payer: Medicare Other | Admitting: Hematology and Oncology

## 2016-05-26 VITALS — BP 169/96 | HR 78 | Temp 97.9°F | Resp 18 | Wt 159.1 lb

## 2016-05-26 DIAGNOSIS — E079 Disorder of thyroid, unspecified: Secondary | ICD-10-CM | POA: Diagnosis not present

## 2016-05-26 DIAGNOSIS — Z79899 Other long term (current) drug therapy: Secondary | ICD-10-CM | POA: Diagnosis not present

## 2016-05-26 DIAGNOSIS — C8583 Other specified types of non-Hodgkin lymphoma, intra-abdominal lymph nodes: Secondary | ICD-10-CM | POA: Diagnosis not present

## 2016-05-26 DIAGNOSIS — Z9221 Personal history of antineoplastic chemotherapy: Secondary | ICD-10-CM | POA: Insufficient documentation

## 2016-05-26 DIAGNOSIS — I10 Essential (primary) hypertension: Secondary | ICD-10-CM | POA: Diagnosis not present

## 2016-05-26 DIAGNOSIS — C8200 Follicular lymphoma grade I, unspecified site: Secondary | ICD-10-CM

## 2016-05-26 LAB — COMPREHENSIVE METABOLIC PANEL
ALT: 17 U/L (ref 17–63)
AST: 22 U/L (ref 15–41)
Albumin: 4.3 g/dL (ref 3.5–5.0)
Alkaline Phosphatase: 64 U/L (ref 38–126)
Anion gap: 5 (ref 5–15)
BUN: 29 mg/dL — ABNORMAL HIGH (ref 6–20)
CO2: 24 mmol/L (ref 22–32)
Calcium: 9 mg/dL (ref 8.9–10.3)
Chloride: 109 mmol/L (ref 101–111)
Creatinine, Ser: 1.43 mg/dL — ABNORMAL HIGH (ref 0.61–1.24)
GFR calc Af Amer: 50 mL/min — ABNORMAL LOW (ref >60)
GFR calc non Af Amer: 43 mL/min — ABNORMAL LOW (ref >60)
Glucose, Bld: 94 mg/dL (ref 65–99)
Potassium: 4.4 mmol/L (ref 3.5–5.1)
Sodium: 138 mmol/L (ref 135–145)
Total Bilirubin: 0.7 mg/dL (ref 0.3–1.2)
Total Protein: 6.9 g/dL (ref 6.5–8.1)

## 2016-05-26 LAB — CBC WITH DIFFERENTIAL/PLATELET
Basophils Absolute: 0 10*3/uL (ref 0–0.1)
Basophils Relative: 1 %
Eosinophils Absolute: 0.1 10*3/uL (ref 0–0.7)
Eosinophils Relative: 1 %
HCT: 43.5 % (ref 40.0–52.0)
Hemoglobin: 15.1 g/dL (ref 13.0–18.0)
Lymphocytes Relative: 36 %
Lymphs Abs: 3.3 10*3/uL (ref 1.0–3.6)
MCH: 30.4 pg (ref 26.0–34.0)
MCHC: 34.6 g/dL (ref 32.0–36.0)
MCV: 87.6 fL (ref 80.0–100.0)
Monocytes Absolute: 0.9 10*3/uL (ref 0.2–1.0)
Monocytes Relative: 10 %
Neutro Abs: 4.8 10*3/uL (ref 1.4–6.5)
Neutrophils Relative %: 52 %
Platelets: 204 10*3/uL (ref 150–440)
RBC: 4.97 MIL/uL (ref 4.40–5.90)
RDW: 14.8 % — ABNORMAL HIGH (ref 11.5–14.5)
WBC: 9.1 10*3/uL (ref 3.8–10.6)

## 2016-05-26 LAB — LACTATE DEHYDROGENASE: LDH: 169 U/L (ref 98–192)

## 2016-05-26 LAB — URIC ACID: Uric Acid, Serum: 6.2 mg/dL (ref 4.4–7.6)

## 2016-05-26 NOTE — Progress Notes (Signed)
[facility] day:  05/26/2016  Chief Complaint: Haldon Carley is a 82 y.o. male with small cleaved nodular lymphocytic non-Hodgkin's lymphoma who is seen for reassessment.  HPI:  The patient is unclear of the details of his malignancy.  He presented in 1998 in [place], [place] with poor appetite, weight loss and sweats.  Abdominal ultrasound revealed adenopathy.  He underwent CT guided biopsy which confirmed lymphoma (no pathology report available).  He is unsure if he had a bone marrow biopsy.  He believes that he had stage IIB disease.  He received CHOP chemotherapy followed by interferon for a year. He states that his disease recurred.  Notes indicate that he received Fludarabine, Rituxan, and CNOP.  He described having low platelets (? ITP per notes).  He does not remember if he had a bone marrow biopsy.  He last Bexxar (tositumomab I-131 radioconjugate) in 2003 at [facility].  He states that he has been in a remission since that time.  PET scan in 08/2004 was negative.  He was last seen by Dr. Oliva Bustard on 05/25/2015.  At that time, he was doing well with no evidence of disease.  CBC revealed a hematocrit of 43.8, hemoglobin 14.8, MCV 86.1, platelets 222,000, WBC 10,000 with an ANC of 5200.  Creatinine was 1.27.  During the interim, he has done well.  He denies any B symptoms.  He denies any adenopathy, bruising or bleeding.   Past Medical History  Diagnosis Date  . Anxiety   . Hypertension   . Depression   . Thyroid disease   . Lymphoma malignant, nodular, lymphocytic ([place]) 05/25/2015    Past Surgical History  Procedure Laterality Date  . Eye surgery  2010    cataract    Family History  Problem Relation Age of Onset  . Hypertension Father   . Cancer Sister     Social History:  reports that he has never smoked. He has never used smokeless tobacco. He reports that he does not drink alcohol or use illicit drugs.  The patient is accompanied by his  wife,  [name], today.  Allergies:  Allergies  Allergen Reactions  . Sulfa Antibiotics Rash    Current Medications: Current Outpatient Prescriptions  Medication Sig Dispense Refill  . acebutolol (SECTRAL) 200 MG capsule TAKE 1 CAPSULE(200 MG) BY MOUTH DAILY 30 capsule 1  . amLODipine (NORVASC) 5 MG tablet Take 1 tablet (5 mg total) by mouth daily. 30 tablet 12  . COMBIGAN 0.2-0.5 % ophthalmic solution INT 1 GTT IN OU BID  5  . diazepam (VALIUM) 2 MG tablet Take 1 tablet (2 mg total) by mouth as needed. 15 tablet 0  . ketoconazole (NIZORAL) 2 % shampoo   6  . latanoprost (XALATAN) 0.005 % ophthalmic solution 1 drop at bedtime.    Marland Kitchen levothyroxine (SYNTHROID, LEVOTHROID) 50 MCG tablet TAKE 1 TABLET(50 MCG) BY MOUTH DAILY BEFORE BREAKFAST 30 tablet 1  . losartan (COZAAR) 50 MG tablet TAKE 1 TABLET BY MOUTH DAILY 90 tablet 3  . sertraline (ZOLOFT) 25 MG tablet Take 1 tablet (25 mg total) by mouth daily. 30 tablet 12  . simvastatin (ZOCOR) 40 MG tablet TAKE 1 TABLET(40 MG) BY MOUTH DAILY AT 6 PM 30 tablet 1  . spironolactone (ALDACTONE) 25 MG tablet Take 1 tablet (25 mg total) by mouth daily. 90 tablet 3  . VIAGRA 100 MG tablet Take 0.5 tablets (50 mg total) by mouth as needed for erectile dysfunction. 6 tablet  12   No current facility-administered medications for this visit.    Review of Systems:  GENERAL:  Feels good.  Active.  No fevers, sweats or weight loss. PERFORMANCE STATUS (ECOG):  0 HEENT:  No visual changes, runny nose, sore throat, mouth sores or tenderness. Lungs: No shortness of breath or cough.  No hemoptysis. Cardiac:  No chest pain, palpitations, orthopnea, or PND. GI:  No nausea, vomiting, diarrhea, constipation, melena or hematochezia. GU:  No urgency, frequency, dysuria, or hematuria. Musculoskeletal:  No back pain.  No joint pain.  No muscle tenderness. Extremities:  Mild lower extremity swelling x 1 year. Skin:  No rashes or skin changes. Neuro:  No headache,  numbness or weakness, balance or coordination issues. Endocrine:  No diabetes.  Thyroid disease on Synthroid.  No hot flashes or night sweats. Psych:  No mood changes, depression or anxiety. Pain:  No focal pain. Review of systems:  All other systems reviewed and found to be negative.  Physical Exam: Blood pressure 169/96, pulse 78, temperature 97.9 F (36.6 C), temperature source Tympanic, resp. rate 18, weight 159 lb 1 oz (72.15 kg). GENERAL:  Well developed, well nourished, sitting comfortably in the exam room in no acute distress. MENTAL STATUS:  Alert and oriented to person, place and time. HEAD:  Pearline Cables hair.  Normocephalic, atraumatic, face symmetric, no Cushingoid features. EYES:  Glasses.  Blue eyes.  Pupils equal round and reactive to light and accomodation.  No conjunctivitis or scleral icterus. ENT:  Oropharynx clear without lesion.  Missing several teeth.  Tongue normal. Mucous membranes moist.  RESPIRATORY:  Clear to auscultation without rales, wheezes or rhonchi. CARDIOVASCULAR:  Regular rate and rhythm without murmur, rub or gallop. ABDOMEN:  Soft, non-tender, with active bowel sounds, and no hepatosplenomegaly.  No masses. SKIN:  No rashes, ulcers or lesions. EXTREMITIES:  Trace chronic edema.  No skin discoloration or tenderness.  No palpable cords. LYMPH NODES: No palpable cervical, supraclavicular, axillary or inguinal adenopathy  NEUROLOGICAL: Unremarkable. PSYCH:  Appropriate.  Appointment on 05/26/2016  Component Date Value Ref Range Status  . WBC 05/26/2016 9.1  3.8 - 10.6 K/uL Final  . RBC 05/26/2016 4.97  4.40 - 5.90 MIL/uL Final  . Hemoglobin 05/26/2016 15.1  13.0 - 18.0 g/dL Final  . HCT 05/26/2016 43.5  40.0 - 52.0 % Final  . MCV 05/26/2016 87.6  80.0 - 100.0 fL Final  . MCH 05/26/2016 30.4  26.0 - 34.0 pg Final  . MCHC 05/26/2016 34.6  32.0 - 36.0 g/dL Final  . RDW 05/26/2016 14.8* 11.5 - 14.5 % Final  . Platelets 05/26/2016 204  150 - 440 K/uL Final  .  Neutrophils Relative % 05/26/2016 52   Final  . Neutro Abs 05/26/2016 4.8  1.4 - 6.5 K/uL Final  . Lymphocytes Relative 05/26/2016 36   Final  . Lymphs Abs 05/26/2016 3.3  1.0 - 3.6 K/uL Final  . Monocytes Relative 05/26/2016 10   Final  . Monocytes Absolute 05/26/2016 0.9  0.2 - 1.0 K/uL Final  . Eosinophils Relative 05/26/2016 1   Final  . Eosinophils Absolute 05/26/2016 0.1  0 - 0.7 K/uL Final  . Basophils Relative 05/26/2016 1   Final  . Basophils Absolute 05/26/2016 0.0  0 - 0.1 K/uL Final  . Sodium 05/26/2016 138  135 - 145 mmol/L Final  . Potassium 05/26/2016 4.4  3.5 - 5.1 mmol/L Final  . Chloride 05/26/2016 109  101 - 111 mmol/L Final  . CO2 05/26/2016 24  22 - 32 mmol/L Final  . Glucose, Bld 05/26/2016 94  65 - 99 mg/dL Final  . BUN 05/26/2016 29* 6 - 20 mg/dL Final  . Creatinine, Ser 05/26/2016 1.43* 0.61 - 1.24 mg/dL Final  . Calcium 05/26/2016 9.0  8.9 - 10.3 mg/dL Final  . Total Protein 05/26/2016 6.9  6.5 - 8.1 g/dL Final  . Albumin 05/26/2016 4.3  3.5 - 5.0 g/dL Final  . AST 05/26/2016 22  15 - 41 U/L Final  . ALT 05/26/2016 17  17 - 63 U/L Final  . Alkaline Phosphatase 05/26/2016 64  38 - 126 U/L Final  . Total Bilirubin 05/26/2016 0.7  0.3 - 1.2 mg/dL Final  . GFR calc non Af Amer 05/26/2016 43* >60 mL/min Final  . GFR calc Af Amer 05/26/2016 50* >60 mL/min Final   Comment: (NOTE) The eGFR has been calculated using the CKD EPI equation. This calculation has not been validated in all clinical situations. eGFR's persistently <60 mL/min signify possible Chronic Kidney Disease.   . Anion gap 05/26/2016 5  5 - 15 Final  . LDH 05/26/2016 169  98 - 192 U/L Final    Assessment:  Prince Couey is a 82 y.o. male with nodular lymphocytic non-Hodgkin's lymphoma diagnosed in 1998 with abdominal adenopathy.  He had B symptoms.  Original stage is unknown.  He was treated with CHOP followed by interferon x 1 year.  His disease recurred.  He was treated with fludarabine, Rituxan,  and CNOP.  His disease recurred in 2003.  He was treated with Bexxar (tositumomab I-131 radioconjugate) in 2003 at [facility].  PET scan in 2005 was normal.  Symptomatically, he denies any complaint.  Exam reveals no adenopathy or hepatosplenomegaly.    Plan: 1.  Review entire medical history, diagnosis and management of lymphoma.  Discuss no evidence of recurrent disease.  Discuss plan for yearly follow-up unless any concerns. 2.  Labs today: CBC with diff, CMP, LDH, uric acid. 3.  RTC in 1 year for MD assessment and labs (CBC with diff, CMP, LDH, uric acid).   Lequita Asal, MD  05/26/2016, 4:07 PM

## 2016-05-26 NOTE — Progress Notes (Signed)
Patient is here for a follow up, high bp reading but he did not take afternoon bp pill.

## 2016-06-03 ENCOUNTER — Ambulatory Visit (INDEPENDENT_AMBULATORY_CARE_PROVIDER_SITE_OTHER): Payer: Medicare Other | Admitting: Family Medicine

## 2016-06-03 ENCOUNTER — Encounter: Payer: Self-pay | Admitting: Family Medicine

## 2016-06-03 VITALS — BP 140/80 | HR 71 | Temp 97.8°F | Resp 16 | Ht 66.0 in | Wt 158.0 lb

## 2016-06-03 DIAGNOSIS — I10 Essential (primary) hypertension: Secondary | ICD-10-CM | POA: Diagnosis not present

## 2016-06-03 DIAGNOSIS — E034 Atrophy of thyroid (acquired): Secondary | ICD-10-CM | POA: Diagnosis not present

## 2016-06-03 DIAGNOSIS — C8299 Follicular lymphoma, unspecified, extranodal and solid organ sites: Secondary | ICD-10-CM

## 2016-06-03 DIAGNOSIS — E038 Other specified hypothyroidism: Secondary | ICD-10-CM

## 2016-06-03 NOTE — Progress Notes (Signed)
Name: Manuel Reynolds   MRN: [id number]    DOB: May 27, 1930   Date:06/03/2016       Progress Note  Subjective  Chief Complaint  Chief Complaint  Patient presents with  . Hypothyroidism    follow up    HPI Here for f/u of thyroid and HBP.  He is taking meds and feeling pretty well.  No problem-specific assessment & plan notes found for this encounter.   Past Medical History  Diagnosis Date  . Anxiety   . Hypertension   . Depression   . Thyroid disease   . 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 Eye surgery  2010    cataract    Family History  Problem Relation Age of Onset  . Hypertension Father   . Cancer Sister     Social History   Social History  . Marital Status: Married    Spouse Name: N/A  . Number of Children: N/A  . Years of Education: N/A   Occupational History  . Not on file.   Social History Main Topics  . Smoking status: Never Smoker   . Smokeless tobacco: Never Used  . Alcohol Use: No  . Drug Use: No  . Sexual Activity: Not on file   Other Topics Concern  . Not on file   Social History Narrative     Current outpatient prescriptions:  .  acebutolol (SECTRAL) 200 MG capsule, TAKE 1 CAPSULE(200 MG) BY MOUTH DAILY, Disp: 30 capsule, Rfl: 1 .  amLODipine (NORVASC) 5 MG tablet, Take 1 tablet (5 mg total) by mouth daily., Disp: 30 tablet, Rfl: 12 .  COMBIGAN 0.2-0.5 % ophthalmic solution, INT 1 GTT IN OU BID, Disp: , Rfl: 5 .  diazepam (VALIUM) 2 MG tablet, Take 1 tablet (2 mg total) by mouth as needed., Disp: 15 tablet, Rfl: 0 .  ketoconazole (NIZORAL) 2 % shampoo, , Disp: , Rfl: 6 .  latanoprost (XALATAN) 0.005 % ophthalmic solution, 1 drop at bedtime., Disp: , Rfl:  .  levothyroxine (SYNTHROID, LEVOTHROID) 50 MCG tablet, TAKE 1 TABLET(50 MCG) BY MOUTH DAILY BEFORE BREAKFAST, Disp: 30 tablet, Rfl: 1 .  losartan (COZAAR) 50 MG tablet, TAKE 1 TABLET BY MOUTH DAILY, Disp: 90  tablet, Rfl: 3 .  sertraline (ZOLOFT) 25 MG tablet, Take 1 tablet (25 mg total) by mouth daily., Disp: 30 tablet, Rfl: 12 .  simvastatin (ZOCOR) 40 MG tablet, TAKE 1 TABLET(40 MG) BY MOUTH DAILY AT 6 PM, Disp: 30 tablet, Rfl: 1 .  spironolactone (ALDACTONE) 25 MG tablet, Take 1 tablet (25 mg total) by mouth daily., Disp: 90 tablet, Rfl: 3 .  VIAGRA 100 MG tablet, Take 0.5 tablets (50 mg total) by mouth as needed for erectile dysfunction., Disp: 6 tablet, Rfl: 12  Allergies  Allergen Reactions  . Sulfa Antibiotics Rash     Review of Systems  Constitutional: Negative for fever, chills, weight loss and malaise/fatigue.  HENT: Negative for hearing loss.   Eyes: Negative for blurred vision and double vision.  Respiratory: Negative for cough, shortness of breath and wheezing.   Cardiovascular: Negative for chest pain, palpitations and leg swelling.  Gastrointestinal: Negative for heartburn, abdominal pain and blood in stool.  Genitourinary: Negative for dysuria, urgency and frequency.  Skin: Negative for rash.  Neurological: Negative for weakness and headaches.      Objective  Filed Vitals:   06/03/16 0923 06/03/16 1013  BP: 153/75 140/80  Pulse:  71   Temp: 97.8 F (36.6 C)   TempSrc: Oral   Resp: 16   Height: '5\' 6"'  (1.676 m)   Weight: 158 lb (71.668 kg)     Physical Exam  Constitutional: He is oriented to person, place, and time and well-developed, well-nourished, and in no distress. No distress.  HENT:  Head: Normocephalic and atraumatic.  Eyes: Conjunctivae and EOM are normal. Pupils are equal, round, and reactive to light. No scleral icterus.  Neck: Normal range of motion. Neck supple. Carotid bruit is not present. No thyromegaly present.  Cardiovascular: Normal rate, regular rhythm and normal heart sounds.  Exam reveals no gallop and no friction rub.   No murmur heard. Pulmonary/Chest: Effort normal and breath sounds normal. No respiratory distress. He has no wheezes.  He exhibits no tenderness.  Musculoskeletal: He exhibits no edema.  Lymphadenopathy:    He has no cervical adenopathy.  Neurological: He is alert and oriented to person, place, and time.  Vitals reviewed.      Recent Results (from the past 2160 hour(s))  Comprehensive Metabolic Panel (CMET)     Status: Abnormal   Collection Time: 03/21/16  8:24 AM  Result Value Ref Range   Glucose 85 65 - 99 mg/dL    Comment: Specimen received hemolyzed. Clinical correlation indicated.   BUN 24 8 - 27 mg/dL   Creatinine, Ser 1.36 (H) 0.76 - 1.27 mg/dL   GFR calc non Af Amer 47 (L) >59 mL/min/1.73   GFR calc Af Amer 55 (L) >59 mL/min/1.73   BUN/Creatinine Ratio 18 10 - 24   Sodium 141 134 - 144 mmol/L   Potassium 4.9 3.5 - 5.2 mmol/L   Chloride 101 96 - 106 mmol/L   CO2 19 18 - 29 mmol/L   Calcium 8.9 8.6 - 10.2 mg/dL   Total Protein 6.4 6.0 - 8.5 g/dL   Albumin 4.1 3.5 - 4.7 g/dL   Globulin, Total 2.3 1.5 - 4.5 g/dL   Albumin/Globulin Ratio 1.8 1.2 - 2.2   Bilirubin Total 0.5 0.0 - 1.2 mg/dL   Alkaline Phosphatase 68 39 - 117 IU/L   AST 26 0 - 40 IU/L   ALT 16 0 - 44 IU/L  CBC with Differential     Status: None   Collection Time: 03/21/16  8:24 AM  Result Value Ref Range   WBC 7.9 3.4 - 10.8 x10E3/uL   RBC 4.95 4.14 - 5.80 x10E6/uL   Hemoglobin 15.2 12.6 - 17.7 g/dL   Hematocrit 42.9 37.5 - 51.0 %   MCV 87 79 - 97 fL   MCH 30.7 26.6 - 33.0 pg   MCHC 35.4 31.5 - 35.7 g/dL   RDW 15.4 12.3 - 15.4 %   Platelets 249 150 - 379 x10E3/uL   Neutrophils 53 %   Lymphs 35 %   Monocytes 11 %   Eos 1 %   Basos 0 %   Neutrophils Absolute 4.2 1.4 - 7.0 x10E3/uL   Lymphocytes Absolute 2.7 0.7 - 3.1 x10E3/uL   Monocytes Absolute 0.9 0.1 - 0.9 x10E3/uL   EOS (ABSOLUTE) 0.1 0.0 - 0.4 x10E3/uL   Basophils Absolute 0.0 0.0 - 0.2 x10E3/uL   Immature Granulocytes 0 %   Immature Grans (Abs) 0.0 0.0 - 0.1 x10E3/uL  Lipid Profile     Status: Abnormal   Collection Time: 03/21/16  8:24 AM  Result  Value Ref Range   Cholesterol, Total 169 100 - 199 mg/dL   Triglycerides 155 (H) 0 - 149  mg/dL   HDL 33 (L) >39 mg/dL   VLDL Cholesterol Cal 31 5 - 40 mg/dL   LDL Calculated 105 (H) 0 - 99 mg/dL   Chol/HDL Ratio 5.1 (H) 0.0 - 5.0 ratio units    Comment:                                   T. Chol/HDL Ratio                                             Men  Women                               1/2 Avg.Risk  3.4    3.3                                   Avg.Risk  5.0    4.4                                2X Avg.Risk  9.6    7.1                                3X Avg.Risk 23.4   11.0   TSH     Status: Abnormal   Collection Time: 03/21/16  8:24 AM  Result Value Ref Range   TSH 5.020 (H) 0.450 - 4.500 uIU/mL  T4     Status: None   Collection Time: 03/21/16  8:24 AM  Result Value Ref Range   T4, Total 9.9 4.5 - 12.0 ug/dL    Comment: Specimen received hemolyzed. Clinical correlation indicated.  T3     Status: None   Collection Time: 03/21/16  8:24 AM  Result Value Ref Range   T3, Total 113 71 - 180 ng/dL  Specimen status report     Status: None   Collection Time: 03/21/16  8:24 AM  Result Value Ref Range   specimen status report Comment     Comment: Written Authorization Written Authorization Written Authorization Received. Authorization received from [facility] 03-26-2016 Logged by Lenice Llamas   CBC with Differential     Status: Abnormal   Collection Time: 05/26/16  2:51 PM  Result Value Ref Range   WBC 9.1 3.8 - 10.6 K/uL   RBC 4.97 4.40 - 5.90 MIL/uL   Hemoglobin 15.1 13.0 - 18.0 g/dL   HCT 43.5 40.0 - 52.0 %   MCV 87.6 80.0 - 100.0 fL   MCH 30.4 26.0 - 34.0 pg   MCHC 34.6 32.0 - 36.0 g/dL   RDW 14.8 (H) 11.5 - 14.5 %   Platelets 204 150 - 440 K/uL   Neutrophils Relative % 52 %   Neutro Abs 4.8 1.4 - 6.5 K/uL   Lymphocytes Relative 36 %   Lymphs Abs 3.3 1.0 - 3.6 K/uL   Monocytes Relative 10 %   Monocytes Absolute 0.9 0.2 - 1.0 K/uL   Eosinophils Relative 1 %    Eosinophils Absolute 0.1 0 - 0.7 K/uL   Basophils Relative 1 %  Basophils Absolute 0.0 0 - 0.1 K/uL  Comprehensive metabolic panel     Status: Abnormal   Collection Time: 05/26/16  2:51 PM  Result Value Ref Range   Sodium 138 135 - 145 mmol/L   Potassium 4.4 3.5 - 5.1 mmol/L   Chloride 109 101 - 111 mmol/L   CO2 24 22 - 32 mmol/L   Glucose, Bld 94 65 - 99 mg/dL   BUN 29 (H) 6 - 20 mg/dL   Creatinine, Ser 1.43 (H) 0.61 - 1.24 mg/dL   Calcium 9.0 8.9 - 10.3 mg/dL   Total Protein 6.9 6.5 - 8.1 g/dL   Albumin 4.3 3.5 - 5.0 g/dL   AST 22 15 - 41 U/L   ALT 17 17 - 63 U/L   Alkaline Phosphatase 64 38 - 126 U/L   Total Bilirubin 0.7 0.3 - 1.2 mg/dL   GFR calc non Af Amer 43 (L) >60 mL/min   GFR calc Af Amer 50 (L) >60 mL/min    Comment: (NOTE) The eGFR has been calculated using the CKD EPI equation. This calculation has not been validated in all clinical situations. eGFR's persistently <60 mL/min signify possible Chronic Kidney Disease.    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   1. Hypothyroidism due to acquired atrophy of thyroid Cont Levothyroxine- TSH  2. Essential hypertension Cont Losartan, Sectral, Spironolactone, and Amlodipine  3. Follicular lymphoma of extranodal site excluding spleen and other solid organs, unspecified follicular lymphoma type ([place])

## 2016-06-04 LAB — TSH: TSH: 2.89 mIU/L (ref 0.40–4.50)

## 2016-06-19 ENCOUNTER — Other Ambulatory Visit: Payer: Self-pay | Admitting: Family Medicine

## 2016-06-19 DIAGNOSIS — I10 Essential (primary) hypertension: Secondary | ICD-10-CM

## 2016-06-19 DIAGNOSIS — F419 Anxiety disorder, unspecified: Secondary | ICD-10-CM

## 2016-07-09 ENCOUNTER — Other Ambulatory Visit: Payer: Self-pay | Admitting: Family Medicine

## 2016-07-15 DIAGNOSIS — H401132 Primary open-angle glaucoma, bilateral, moderate stage: Secondary | ICD-10-CM | POA: Diagnosis not present

## 2016-07-22 DIAGNOSIS — H401132 Primary open-angle glaucoma, bilateral, moderate stage: Secondary | ICD-10-CM | POA: Diagnosis not present

## 2016-08-04 ENCOUNTER — Other Ambulatory Visit: Payer: Self-pay | Admitting: Family Medicine

## 2016-08-18 ENCOUNTER — Other Ambulatory Visit: Payer: Self-pay | Admitting: Family Medicine

## 2016-08-18 DIAGNOSIS — I10 Essential (primary) hypertension: Secondary | ICD-10-CM

## 2016-08-30 ENCOUNTER — Other Ambulatory Visit: Payer: Self-pay | Admitting: Family Medicine

## 2016-08-30 DIAGNOSIS — R600 Localized edema: Secondary | ICD-10-CM

## 2016-08-30 DIAGNOSIS — I10 Essential (primary) hypertension: Secondary | ICD-10-CM

## 2016-09-16 ENCOUNTER — Ambulatory Visit: Payer: Medicare Other | Admitting: Family Medicine

## 2016-11-03 ENCOUNTER — Other Ambulatory Visit: Payer: Self-pay | Admitting: Family Medicine

## 2016-11-03 MED ORDER — VIAGRA 100 MG PO TABS: ORAL_TABLET | ORAL | 0 refills | Status: DC

## 2016-11-11 ENCOUNTER — Encounter: Payer: Self-pay | Admitting: Family Medicine

## 2016-11-11 ENCOUNTER — Ambulatory Visit (INDEPENDENT_AMBULATORY_CARE_PROVIDER_SITE_OTHER): Payer: Medicare Other | Admitting: Family Medicine

## 2016-11-11 VITALS — BP 125/75 | HR 80 | Temp 98.6°F | Resp 16 | Ht 66.0 in | Wt 157.0 lb

## 2016-11-11 DIAGNOSIS — Q845 Enlarged and hypertrophic nails: Secondary | ICD-10-CM

## 2016-11-11 DIAGNOSIS — M722 Plantar fascial fibromatosis: Secondary | ICD-10-CM

## 2016-11-11 MED ORDER — MELOXICAM 7.5 MG PO TABS: 7.5000 mg | ORAL_TABLET | Freq: Every day | ORAL | 1 refills | Status: DC

## 2016-11-11 NOTE — Progress Notes (Signed)
Name: Manuel Reynolds   MRN: [id number]    DOB: Mar 27, 1930   Date:11/11/2016       Progress Note  Subjective  Chief Complaint  Chief Complaint  Patient presents with  . Acute Visit    foot stiffness    HPI  C/o bilateral foot stiffness, mid foot.  Strain to dorsiflex either foot.  No trauma.  No heat.  No redness. No problem-specific Assessment & Plan notes found for this encounter.   Past Medical History:  Diagnosis Date  . Anxiety   . Depression   . Hypertension   . 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 Thyroid disease     Past Surgical History:  Procedure Laterality Date  . EYE SURGERY  2010   cataract    Family History  Problem Relation Age of Onset  . Hypertension Father   . Cancer Sister     Social History   Social History  . Marital status: Married    Spouse name: N/A  . Number of children: N/A  . Years of education: N/A   Occupational History  . Not on file.   Social History Main Topics  . Smoking status: Never Smoker  . Smokeless tobacco: Never Used  . Alcohol use No  . Drug use: No  . Sexual activity: Not on file   Other Topics Concern  . Not on file   Social History Narrative  . No narrative on file     Current Outpatient Prescriptions:  .  acebutolol (SECTRAL) 200 MG capsule, TAKE 1 CAPSULE(200 MG) BY MOUTH DAILY, Disp: 30 capsule, Rfl: 12 .  amLODipine (NORVASC) 5 MG tablet, TAKE 1 TABLET(5 MG) BY MOUTH DAILY, Disp: 30 tablet, Rfl: 12 .  COMBIGAN 0.2-0.5 % ophthalmic solution, INT 1 GTT IN OU BID, Disp: , Rfl: 5 .  diazepam (VALIUM) 2 MG tablet, Take 1 tablet (2 mg total) by mouth as needed., Disp: 15 tablet, Rfl: 0 .  ketoconazole (NIZORAL) 2 % shampoo, , Disp: , Rfl: 6 .  latanoprost (XALATAN) 0.005 % ophthalmic solution, 1 drop at bedtime., Disp: , Rfl:  .  levothyroxine (SYNTHROID, LEVOTHROID) 50 MCG tablet, TAKE 1 TABLET(50 MCG) BY MOUTH DAILY BEFORE BREAKFAST, Disp: 30 tablet, Rfl: 12 .  losartan (COZAAR) 50  MG tablet, TAKE 1 TABLET(50 MG) BY MOUTH DAILY, Disp: 30 tablet, Rfl: 12 .  sertraline (ZOLOFT) 25 MG tablet, TAKE 1 TABLET(25 MG) BY MOUTH DAILY, Disp: 30 tablet, Rfl: 3 .  simvastatin (ZOCOR) 40 MG tablet, Take 40 mg by mouth daily., Disp: , Rfl:  .  spironolactone (ALDACTONE) 25 MG tablet, TAKE 1 TABLET(25 MG) BY MOUTH DAILY, Disp: 90 tablet, Rfl: 3 .  VIAGRA 100 MG tablet, TAKE 1/2 TABLET BY MOUTH AS NEEDED FOR ERECTILE DYSFUNCTION., Disp: 6 tablet, Rfl: 0 .  meloxicam (MOBIC) 7.5 MG tablet, Take 1 tablet (7.5 mg total) by mouth daily. Take with food., Disp: 15 tablet, Rfl: 1  Allergies  Allergen Reactions  . Sulfa Antibiotics Rash     Review of Systems  Constitutional: Negative for chills, fever, malaise/fatigue and weight loss.  HENT: Negative for hearing loss and tinnitus.   Eyes: Negative for blurred vision and double vision.  Respiratory: Negative for cough, sputum production, shortness of breath and wheezing.   Cardiovascular: Negative for chest pain, palpitations and leg swelling.  Gastrointestinal: Negative for abdominal pain, blood in stool and heartburn.  Genitourinary: Negative for dysuria, frequency and urgency.  Musculoskeletal: Positive for joint pain (bilateral mid  foot pain).  Skin: Negative for rash.  Neurological: Negative for dizziness, tingling, tremors, weakness and headaches.      Objective  Vitals:   11/11/16 0921 11/11/16 0949  BP: (!) 158/91 125/75  Pulse: 80   Resp: 16   Temp: 98.6 F (37 C)   TempSrc: Oral   Weight: 157 lb (71.2 kg)   Height: 5\' 6"  (1.676 m)     Physical Exam  Constitutional: He is oriented to person, place, and time and well-developed, well-nourished, and in no distress. No distress.  HENT:  Head: Normocephalic and atraumatic.  Neck: Normal range of motion. Carotid bruit is not present. No thyromegaly present.  Cardiovascular: Normal rate, regular rhythm and normal heart sounds.  Exam reveals no gallop and no friction  rub.   No murmur heard. Pulmonary/Chest: Effort normal and breath sounds normal. No respiratory distress. He has no wheezes. He has no rales.  Musculoskeletal: He exhibits no edema.  Tenderness to stretching of plantar fascia of both feet.  No bony abnormaitiies appreciated.  Severe hypertrophic toenails bilaterally,  Lymphadenopathy:    He has no cervical adenopathy.  Neurological: He is alert and oriented to person, place, and time.       No results found for this or any previous visit (from the past 2160 hour(s)).   Assessment & Plan  Problem List Items Addressed This Visit    None    Visit Diagnoses    Plantar fasciitis    -  Primary   Relevant Medications   meloxicam (MOBIC) 7.5 MG tablet   Enlarged and hypertrophic nails       Relevant Orders   Ambulatory referral to Podiatry      Meds ordered this encounter  Medications  . simvastatin (ZOCOR) 40 MG tablet    Sig: Take 40 mg by mouth daily.  . meloxicam (MOBIC) 7.5 MG tablet    Sig: Take 1 tablet (7.5 mg total) by mouth daily. Take with food.    Dispense:  15 tablet    Refill:  1    1. Plantar fasciitis Use ice and stretching exercises. - meloxicam (MOBIC) 7.5 MG tablet; Take 1 tablet (7.5 mg total) by mouth daily. Take with food.  Dispense: 15 tablet; Refill: 1  2. Enlarged and hypertrophic nails  - Ambulatory referral to Podiatry

## 2016-11-25 DIAGNOSIS — H35343 Macular cyst, hole, or pseudohole, bilateral: Secondary | ICD-10-CM | POA: Diagnosis not present

## 2016-11-29 ENCOUNTER — Other Ambulatory Visit: Payer: Self-pay | Admitting: Family Medicine

## 2016-11-29 DIAGNOSIS — F419 Anxiety disorder, unspecified: Secondary | ICD-10-CM

## 2016-12-05 ENCOUNTER — Encounter: Payer: Self-pay | Admitting: Podiatry

## 2016-12-05 ENCOUNTER — Ambulatory Visit (INDEPENDENT_AMBULATORY_CARE_PROVIDER_SITE_OTHER): Payer: Medicare Other | Admitting: Podiatry

## 2016-12-05 VITALS — BP 134/101 | HR 79 | Temp 98.1°F | Resp 16

## 2016-12-05 DIAGNOSIS — S91109A Unspecified open wound of unspecified toe(s) without damage to nail, initial encounter: Secondary | ICD-10-CM

## 2016-12-05 DIAGNOSIS — M79676 Pain in unspecified toe(s): Secondary | ICD-10-CM

## 2016-12-05 DIAGNOSIS — M79609 Pain in unspecified limb: Secondary | ICD-10-CM

## 2016-12-05 DIAGNOSIS — S91209A Unspecified open wound of unspecified toe(s) with damage to nail, initial encounter: Secondary | ICD-10-CM

## 2016-12-05 DIAGNOSIS — B351 Tinea unguium: Secondary | ICD-10-CM | POA: Diagnosis not present

## 2016-12-05 DIAGNOSIS — L603 Nail dystrophy: Secondary | ICD-10-CM

## 2016-12-05 DIAGNOSIS — L608 Other nail disorders: Secondary | ICD-10-CM

## 2016-12-05 MED ORDER — DOXYCYCLINE HYCLATE 100 MG PO TABS: 100.0000 mg | ORAL_TABLET | Freq: Two times a day (BID) | ORAL | 0 refills | Status: DC

## 2016-12-05 NOTE — Progress Notes (Signed)
   Subjective:    Patient ID: Manuel Reynolds, male    DOB: [birth year], 83 y.o.   MRN: [id number]  HPI    Review of Systems  All other systems reviewed and are negative.      Objective:   Physical Exam        Assessment & Plan:

## 2016-12-07 ENCOUNTER — Other Ambulatory Visit: Payer: Self-pay | Admitting: Family Medicine

## 2016-12-07 DIAGNOSIS — M722 Plantar fascial fibromatosis: Secondary | ICD-10-CM

## 2016-12-09 ENCOUNTER — Ambulatory Visit: Payer: Medicare Other | Admitting: Podiatry

## 2016-12-09 ENCOUNTER — Ambulatory Visit: Payer: Medicare Other | Admitting: Family Medicine

## 2016-12-10 NOTE — Progress Notes (Signed)
Patient ID: Manuel Reynolds, male   DOB: Jun 28, 1930, 83 y.o.   MRN: [id number]   SUBJECTIVE Patient  presents to office today complaining of elongated, thickened nails. Pain while ambulating in shoes. Patient is unable to trim their own nails.  Patient also complains of a very painful right great toenail came off. Patient states that the nail was removed off he denies trauma however. Patient states is very painful.  OBJECTIVE General Patient is awake, alert, and oriented x 3 and in no acute distress. Derm traumatic avulsion of the right great toenail with some underlying wounds to the nail bed. Wound appears to be granular. No malodor noted. Minimal sanguinous drainage noted. Skin is dry and supple bilateral. Negative open lesions or macerations. Remaining integument unremarkable. Nails are tender, long, thickened and dystrophic with subungual debris, consistent with onychomycosis, 1-5 bilateral. No signs of infection noted. Vasc  DP and PT pedal pulses palpable bilaterally. Temperature gradient within normal limits.  Neuro Epicritic and protective threshold sensation diminished bilaterally.  Musculoskeletal Exam No symptomatic pedal deformities noted bilateral. Muscular strength within normal limits.  ASSESSMENT 1. Onychodystrophic nails 1-5 bilateral with hyperkeratosis of nails.  2. Onychomycosis of nail due to dermatophyte bilateral 3. Pain in foot bilateral 4. Traumatic avulsion of the right great toenail with underlying wound to the nail bed  PLAN OF CARE 1. Patient evaluated today.  2. Instructed to maintain good pedal hygiene and foot care.  3. Mechanical debridement of nails 1-5 bilaterally performed using a nail nipper. Filed with dremel without incident.  4. Medically necessary excisional debridement of the open wound to the right great toe nail bed was performed using a tissue nipper. Debridement of all the necrotic nonviable tissue down to healthy bleeding viable tissue was performed.   5. Prescription for doxycycline  6. Return to clinic in 3 mos.    Edrick Kins, DPM [facility]  Dr. Edrick Kins, [address]                                        [place], [place] [address]                Office [phone]  Fax [phone]

## 2016-12-19 ENCOUNTER — Encounter: Payer: Self-pay | Admitting: Podiatry

## 2016-12-19 ENCOUNTER — Ambulatory Visit (INDEPENDENT_AMBULATORY_CARE_PROVIDER_SITE_OTHER): Payer: Medicare Other | Admitting: Podiatry

## 2016-12-19 DIAGNOSIS — S91209D Unspecified open wound of unspecified toe(s) with damage to nail, subsequent encounter: Secondary | ICD-10-CM | POA: Diagnosis not present

## 2016-12-19 DIAGNOSIS — R252 Cramp and spasm: Secondary | ICD-10-CM

## 2016-12-19 MED ORDER — BACLOFEN 10 MG PO TABS: 5.0000 mg | ORAL_TABLET | Freq: Two times a day (BID) | ORAL | 0 refills | Status: DC

## 2016-12-23 ENCOUNTER — Encounter: Payer: Self-pay | Admitting: Family Medicine

## 2016-12-23 ENCOUNTER — Ambulatory Visit (INDEPENDENT_AMBULATORY_CARE_PROVIDER_SITE_OTHER): Payer: Medicare Other | Admitting: Family Medicine

## 2016-12-23 VITALS — BP 131/70 | HR 79 | Temp 98.6°F | Resp 16 | Ht 66.0 in | Wt 165.0 lb

## 2016-12-23 DIAGNOSIS — J101 Influenza due to other identified influenza virus with other respiratory manifestations: Secondary | ICD-10-CM

## 2016-12-23 DIAGNOSIS — R509 Fever, unspecified: Secondary | ICD-10-CM

## 2016-12-23 LAB — POCT INFLUENZA A/B
Influenza A, POC: POSITIVE — AB
Influenza B, POC: NEGATIVE

## 2016-12-23 MED ORDER — OSELTAMIVIR PHOSPHATE 75 MG PO CAPS: 75.0000 mg | ORAL_CAPSULE | Freq: Two times a day (BID) | ORAL | 0 refills | Status: DC

## 2016-12-23 MED ORDER — BENZONATATE 100 MG PO CAPS: 100.0000 mg | ORAL_CAPSULE | Freq: Three times a day (TID) | ORAL | 0 refills | Status: DC | PRN

## 2016-12-23 NOTE — Progress Notes (Signed)
Subjective:    Patient ID: Manuel Reynolds, male    DOB: 11/08/1930, 83 y.o.   MRN: [id number]  Jun Zeitz is a 83 y.o. male presenting on 12/23/2016 for Cough (as per spouse pt had 100.6 fever tickling in throat from coughing mild HA  onset 3 days)  Patient presents for a same day appointment.  HPI  INFLUENZA A - Reports symptoms started with congestion and cough about 2 days ago, some worsening, but overall tolerating symptoms and came in to get evaluated early and for flu testing. He has history of influenza before, and has received flu vaccine this season - Taking Coricidan with some relief - Wife sick contact, similar symptoms - Denies fevers/chills, sweats, muscle aches, headache, nausea, vomiting, diarrhea, abdominal pain, sinus pan or pressure   Social History  Substance Use Topics  . Smoking status: Never Smoker  . Smokeless tobacco: Never Used  . Alcohol use No    Review of Systems Per HPI unless specifically indicated above     Objective:    BP 131/70   Pulse 79   Temp 98.6 F (37 C) (Oral)   Resp 16   Ht 5\' 6"  (1.676 m)   Wt 165 lb (74.8 kg)   SpO2 100%   BMI 26.63 kg/m   Wt Readings from Last 3 Encounters:  12/23/16 165 lb (74.8 kg)  11/11/16 157 lb (71.2 kg)  06/03/16 158 lb (71.7 kg)    Physical Exam  Constitutional: He appears well-developed and well-nourished. No distress.  Well-appearing elderly 83 yr male, comfortable, cooperative  HENT:  Head: Normocephalic and atraumatic.  Mouth/Throat: Oropharynx is clear and moist.  Frontal / maxillary sinuses non-tender. Nares patent without purulence or edema. Oropharynx clear without erythema, exudates, edema or asymmetry.  Eyes: Conjunctivae are normal. Right eye exhibits no discharge. Left eye exhibits no discharge.  Neck: Normal range of motion. Neck supple.  Cardiovascular: Normal rate, regular rhythm, normal heart sounds and intact distal pulses.   No murmur heard. Pulmonary/Chest: Effort normal  and breath sounds normal. No respiratory distress. He has no wheezes. He has no rales.  Good air movement. Speaks full sentences.  Musculoskeletal: He exhibits no edema.  Lymphadenopathy:    He has no cervical adenopathy.  Neurological: He is alert.  Skin: Skin is warm and dry. No rash noted. He is not diaphoretic. No erythema.  Psychiatric: His behavior is normal.  Nursing note and vitals reviewed.  I have personally reviewed the following lab results from 12/23/16.  Results for orders placed or performed in visit on 12/23/16  POCT Influenza A/B  Result Value Ref Range   Influenza A, POC Positive (A) Negative   Influenza B, POC Negative Negative      Assessment & Plan:   Problem List Items Addressed This Visit    None    Visit Diagnoses    Influenza A    -  Primary  Consistent with influenza with viral URI symptoms in setting of positive rapid influenza A test today in office, likely milder symptoms clinically with early onset and s/p influenza vaccine. However, still high risk population given age 89. - Start Tamiflu 75mg  BID x 5 days, given 2nd rx for wife to start taking - Rx Tessalon PRN cough - Advised supportive care as needed Tylenol / NSAID PRN - Follow-up as needed, return criteria if significant worsening    Relevant Medications   benzonatate (TESSALON PERLES) 100 MG capsule   oseltamivir (TAMIFLU) 75 MG capsule  Fever and chills       Relevant Orders   POCT Influenza A/B (Completed)      Meds ordered this encounter  Medications  . DISCONTD: oseltamivir (TAMIFLU) 75 MG capsule    Sig: Take 1 capsule (75 mg total) by mouth 2 (two) times daily. For 5 days    Dispense:  10 capsule    Refill:  0  . benzonatate (TESSALON PERLES) 100 MG capsule    Sig: Take 1 capsule (100 mg total) by mouth 3 (three) times daily as needed for cough.    Dispense:  30 capsule    Refill:  0  . oseltamivir (TAMIFLU) 75 MG capsule    Sig: Take 1 capsule (75 mg total) by mouth 2  (two) times daily. For 5 days    Dispense:  10 capsule    Refill:  0    For wife close contact flu exposure with some symptoms      Follow up plan: Return in about 1 week (around 12/30/2016), or if symptoms worsen or fail to improve, for flu / bronchitis.  Nobie Putnam, [facility] 12/23/2016, 9:20 PM

## 2016-12-23 NOTE — Patient Instructions (Signed)
Thank you for coming in to clinic today.  1. You tested positive for Influenza A - Treat with Tamiflu take one 75mg  capsules twice a day for 5 days - Take Tessalon perls one every 8 hours or 3 times a day as needed for cough - May try Mucinex liquid OTC for cough - Stay well hydrated  Also sent 2nd prescription Tamiflu for your wife to use, so pick up 2 at pharmacy and both can take 1 pill twice a day for 5 days  If worsening after you have already improved, fevers return, thicker productive cough, notify office and we can cover you with antibiotic  Please schedule a follow-up appointment with Dr. Parks Ranger in 1-2 weeks as needed for Flu / Bronchitis  If you have any other questions or concerns, please feel free to call the clinic or send a message through [place]. You may also schedule an earlier appointment if necessary.  Manuel Putnam, DO [place]

## 2016-12-28 ENCOUNTER — Other Ambulatory Visit: Payer: Self-pay | Admitting: Family Medicine

## 2016-12-28 DIAGNOSIS — M722 Plantar fascial fibromatosis: Secondary | ICD-10-CM

## 2016-12-29 NOTE — Progress Notes (Signed)
   Subjective:  Patient presents today for follow-up evaluation of a traumatic avulsion to the right great toenail with an underlying wound to the nail bed. Patient states that he is doing better. He does have some, however he has otherwise no complaints regarding his right great toe.  Patient does have a new complaint for some tenderness and cramping to the right foot. Cramping is intermittent. No alleviating or aggravating factors. Patient states that it feels like a rubber band at the bottom of his foot.    Objective/Physical Exam General: The patient is alert and oriented x3 in no acute distress.  Dermatology: traumatic avulsion of the right great toenail with some underlying wounds to the nail bed. Wound appears to be granular. No malodor noted. Minimal sanguinous drainage noted. Skin is dry and supple bilateral. Negative open lesions or macerations. Remaining integument unremarkable. Skin is warm, dry and supple bilateral lower extremities. Negative for open lesions or macerations.  Vascular: Palpable pedal pulses bilaterally. No edema or erythema noted. Capillary refill within normal limits.  Neurological: Epicritic and protective threshold grossly intact bilaterally.   Musculoskeletal Exam: Range of motion within normal limits to all pedal and ankle joints bilateral. Muscle strength 5/5 in all groups bilateral.   Assessment: #1 intrinsic muscle cramps right foot #2 traumatic avulsion of right great toenail with routine healing   Plan of Care:  #1 Patient was evaluated. #2 prescription for baclofen #3 return to clinic when necessary   Edrick Kins, DPM [facility]  Dr. Edrick Kins, [address]                                        [place], [place] [address]                Office [phone]  Fax [phone]

## 2017-01-15 ENCOUNTER — Other Ambulatory Visit: Payer: Self-pay | Admitting: Podiatry

## 2017-02-17 ENCOUNTER — Other Ambulatory Visit: Payer: Self-pay | Admitting: Podiatry

## 2017-02-17 NOTE — Telephone Encounter (Signed)
Pharmacy sent email notice for baclofen. I spoke with pt's wife, [name] and she states she spoke with pt and he does not need it.

## 2017-03-05 ENCOUNTER — Ambulatory Visit: Payer: Medicare Other | Admitting: Podiatry

## 2017-03-11 ENCOUNTER — Other Ambulatory Visit: Payer: Self-pay | Admitting: Family Medicine

## 2017-03-11 ENCOUNTER — Ambulatory Visit (INDEPENDENT_AMBULATORY_CARE_PROVIDER_SITE_OTHER): Payer: Medicare Other | Admitting: Family Medicine

## 2017-03-11 ENCOUNTER — Encounter: Payer: Self-pay | Admitting: Family Medicine

## 2017-03-11 VITALS — BP 120/78 | HR 75 | Temp 97.6°F | Resp 16 | Ht 66.0 in | Wt 161.0 lb

## 2017-03-11 DIAGNOSIS — M722 Plantar fascial fibromatosis: Secondary | ICD-10-CM | POA: Diagnosis not present

## 2017-03-11 DIAGNOSIS — E034 Atrophy of thyroid (acquired): Secondary | ICD-10-CM

## 2017-03-11 DIAGNOSIS — F419 Anxiety disorder, unspecified: Secondary | ICD-10-CM | POA: Diagnosis not present

## 2017-03-11 DIAGNOSIS — N183 Chronic kidney disease, stage 3 unspecified: Secondary | ICD-10-CM

## 2017-03-11 DIAGNOSIS — I10 Essential (primary) hypertension: Secondary | ICD-10-CM

## 2017-03-11 DIAGNOSIS — C8299 Follicular lymphoma, unspecified, extranodal and solid organ sites: Secondary | ICD-10-CM | POA: Diagnosis not present

## 2017-03-11 DIAGNOSIS — E782 Mixed hyperlipidemia: Secondary | ICD-10-CM | POA: Diagnosis not present

## 2017-03-11 DIAGNOSIS — Z0000 Encounter for general adult medical examination without abnormal findings: Secondary | ICD-10-CM

## 2017-03-11 DIAGNOSIS — R7309 Other abnormal glucose: Secondary | ICD-10-CM

## 2017-03-11 NOTE — Patient Instructions (Signed)
Thank you for coming in to clinic today.  1.  Keep up the good work  2. BP is improved  3. Follow-up with Dr Amalia Hailey for Plantar Fasciitis as needed  4. Start taking Sertraline 25mg  daily for anxiety, it may take 4-6 weeks for full effect, if need higher dose we can adjust this in future  You will be due for FASTING BLOOD WORK (no food or drink after midnight before, only water or coffee without cream/sugar on the morning of)  - Please go ahead and schedule a "Lab Only" visit in the morning at the clinic for lab draw in 3 months  - Make sure Lab Only appointment is at least 1-2 weeks before your next appointment, so that results will be available  For Lab Results, once available within 2-3 days of blood draw, you can can log in to MyChart online to view your results and a brief explanation. Also, we can discuss results at next follow-up visit.  Please schedule a follow-up appointment with Dr. Parks Ranger in 3 months for Annual Physical  If you have any other questions or concerns, please feel free to call the clinic or send a message through [place]. You may also schedule an earlier appointment if necessary.  Nobie Putnam, DO [place]

## 2017-03-11 NOTE — Progress Notes (Signed)
Subjective:    Patient ID: Manuel Reynolds, male    DOB: May 16, 1930, 83 y.o.   MRN: [id number]  Manuel Reynolds is a 83 y.o. male presenting on 03/11/2017 for Hypertension   HPI   Plantar Fasciitis, Left > Right - History of Plantar Fasciitis, managed by prior PCP and also Podiatry ([place], Dr Amalia Hailey). Has been given variety of medications including Meloxicam from PCP, also Baclofen from Podiatry (thought related to foot cramps), also tried home exercises stretching ice bottle foot rolls. Overall mixed results. - Currently seems to have improved, without significant tenderness, does have some stiffness and "sensation of tightness" on bottom of foot and "pulling" - Has since stopped Meloxicam, and infrequently takes Baclofen - Denies any injury, swelling, redness,   Hypothyroidism - Chronic problem since 2003, secondary to radiation therapy for lymphoma, then only had problem with low thyroid for past 2-3 years - Continues Levothyroxine 2mcg daily - Admits some weight gain - TSH 5 to 2.89 (last 05/2016)  Anxiety, Chronic - Reports chronic problem with mild anxiety, does not seem to bother him at all times, only occasionally under certain stressors. Did not review long-term history of this problem. - In past he was given Valium rx only #15 pills 0 refills by prior PCP in 2016, he still has pills left over from prior rx, very rarely needed it. - Now he was started on Sertraline 25mg  daily (per prior PCP in 11/2016), but he admits to only taking it PRN infrequently and it being "not effective" - Denies significant generalized anxiety, panic, depression, insomnia  HYPERLIPIDEMIA: - Reports no concerns. Last lipid panel 02/2016, several abnormalities,  - Currently taking Simvastatin 40mg  nightly, tolerating well without side effects or myalgias  CHRONIC HTN, complicated by CKD-III Reports no concerns today. Not checking BP regularly, usually elevated first at doctors office. Current Meds  - Losartan 50mg  daily, Spironolasctone 25mg , Amlodapine 5mg , Acetabutolol 200mg  (due to history of tachycardia) Reports good compliance, took meds today. Tolerating well, w/o complaints. Denies CP, dyspnea, HA, edema, dizziness / lightheadedness  Non-Hodgkin's Lymphoma, in remission, s/p chemotherapy - Followed by [facility], brief history initial dx 1998, had work-up, imaging, biopsy confirmation, received chemotherapy. He has been in remission since 2005.   Social History  Substance Use Topics  . Smoking status: Never Smoker  . Smokeless tobacco: Never Used  . Alcohol use No    Review of Systems Per HPI unless specifically indicated above     Objective:    BP 120/78 (BP Location: Left Arm, Cuff Size: Normal)   Pulse 75   Temp 97.6 F (36.4 C) (Oral)   Resp 16   Ht 5\' 6"  (1.676 m)   Wt 161 lb (73 kg)   BMI 25.99 kg/m   Wt Readings from Last 3 Encounters:  03/11/17 161 lb (73 kg)  12/23/16 165 lb (74.8 kg)  11/11/16 157 lb (71.2 kg)    Physical Exam  Constitutional: He is oriented to person, place, and time. He appears well-developed and well-nourished. No distress.  Well-appearing, comfortable, cooperative  HENT:  Head: Normocephalic and atraumatic.  Mouth/Throat: Oropharynx is clear and moist.  Eyes: Conjunctivae are normal.  Neck: Normal range of motion. Neck supple. No thyromegaly present.  Cardiovascular: Normal rate, regular rhythm, normal heart sounds and intact distal pulses.   No murmur heard. Pulmonary/Chest: Effort normal and breath sounds normal. No respiratory distress. He has no wheezes. He has no rales.  Musculoskeletal: He exhibits no edema.  Bilateral  Feet Inspection: normal appearance, no erythema, edema Palpation: non-tender plantar aspect, medial heel, and heel ROM: full active ROM dorsiflex/plantar flex, ankle Strength: 5/5 Neurovascular: intact  Lymphadenopathy:    He has no cervical adenopathy.  Neurological: He is alert and  oriented to person, place, and time.  Skin: Skin is warm and dry. No rash noted. He is not diaphoretic. No erythema.  Psychiatric: He has a normal mood and affect. His behavior is normal.  Well groomed, good eye contact, normal speech and thoughts, does not appear anxious  Nursing note and vitals reviewed.     Assessment & Plan:   Problem List Items Addressed This Visit    Plantar fasciitis    Currently seems resolved / controlled. Without any objective findings of pain or other abnormality on foot exam.  Plan: 1. Reassurance 2. Stay active, continue stretches as needed 3. Remain off NSAIDs due to CKD 4. Continue Tylenol PRN, topical relief if needed 5. 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 Continue current dose Levothyroxine 73mcg daily for now 2. Check labs and TSH 3 months annual physical adjust dose as needed      Hypertension - Primary    Controlled BP, initial mild elevated SBP >150, improved on manual re-check normal range. - Complication: CKD-III  Plan: 1. Continue current regimen Losartan 50mg  daily, Spironolasctone 25mg , Amlodapine 5mg , Acetabutolol 200mg  (h/o tachycardia) 2. Encourage low sodium diet, regular activity 3. Follow-up 3 months annual physical, labs      Hyperlipidemia    Mild abnormal lipids, last 02/2016 Continue Simvastatin 40mg  nightly, without myalgias Follow-up 3 months fasting lipids      CKD (chronic kidney disease), stage III    Stable chronic problem, likely secondary to HTN, also possibly with lymphoma s/p chemotherapy Cr baseline 1.4 to 1.5  Plan: 1. Monitor Cr regularly, labs 3 months 2. Remain off NSAIDs, DC'd  meloxicam from list. Encouraged Tylenol use PRN pain 3. Improve hydration 4. Control BP 5. Follow-up as needed      Anxiety    Stable chronic mild anxiety, does not seem consistent with GAD or panic. - Prior med Diazepam (still has some, old rx, used extremely rarely) - No prior Psych / counseling  Plan: 1. Continue Sertraline 25mg  daily - instructed to resume at daily use instead of PRN for best results, may need to gradually titrate dose up if tolerated well, goal to stabilize anxiety and avoid flares, discussed BDZ use and advised this should only be for emergency, concerns with age and risks of these meds with dependence, withdrawal, sedation, fall risk 2. Follow-up as needed, 3 months for annual physical can check GAD7/PHQ         No orders of the defined types were placed in this encounter.     Follow up plan: Return in about 3 months (around 06/10/2017) for Annual Physical.  Will be due for Prevnar-13 at next visit, [facility] review shows he is s/p Pneumovax-23 on 11/03/2013.  Nobie Putnam, [facility] 03/12/2017, 12:14 AM

## 2017-03-12 DIAGNOSIS — N183 Chronic kidney disease, stage 3 unspecified: Secondary | ICD-10-CM | POA: Insufficient documentation

## 2017-03-12 NOTE — Assessment & Plan Note (Signed)
Stable, chronic problem (since 2003) s/p radiation for lymphoma - Last TSH 2.89 (05/2016)  Plan: 1. Continue current dose Levothyroxine 8mcg daily for now 2. Check labs and TSH 3 months annual physical adjust dose as needed

## 2017-03-12 NOTE — Assessment & Plan Note (Signed)
Mild abnormal lipids, last 02/2016 Continue Simvastatin 40mg  nightly, without myalgias Follow-up 3 months fasting lipids

## 2017-03-12 NOTE — Assessment & Plan Note (Signed)
Stable, without recurrence Followed by [facility] yearly

## 2017-03-12 NOTE — Assessment & Plan Note (Signed)
Currently seems resolved / controlled. Without any objective findings of pain or other abnormality on foot exam.  Plan: 1. Reassurance 2. Stay active, continue stretches as needed 3. Remain off NSAIDs due to CKD 4. Continue Tylenol PRN, topical relief if needed 5. Follow-up as planned with Podiatry in future if needed consider MSk ultrasound vs steroid injection

## 2017-03-12 NOTE — Assessment & Plan Note (Signed)
Controlled BP, initial mild elevated SBP >150, improved on manual re-check normal range. - Complication: CKD-III  Plan: 1. Continue current regimen Losartan 50mg  daily, Spironolasctone 25mg , Amlodapine 5mg , Acetabutolol 200mg  (h/o tachycardia) 2. Encourage low sodium diet, regular activity 3. Follow-up 3 months annual physical, labs

## 2017-03-12 NOTE — Assessment & Plan Note (Signed)
Stable chronic mild anxiety, does not seem consistent with GAD or panic. - Prior med Diazepam (still has some, old rx, used extremely rarely) - No prior Psych / counseling  Plan: 1. Continue Sertraline 25mg  daily - instructed to resume at daily use instead of PRN for best results, may need to gradually titrate dose up if tolerated well, goal to stabilize anxiety and avoid flares, discussed BDZ use and advised this should only be for emergency, concerns with age and risks of these meds with dependence, withdrawal, sedation, fall risk 2. Follow-up as needed, 3 months for annual physical can check GAD7/PHQ

## 2017-03-12 NOTE — Assessment & Plan Note (Signed)
Stable chronic problem, likely secondary to HTN, also possibly with lymphoma s/p chemotherapy Cr baseline 1.4 to 1.5  Plan: 1. Monitor Cr regularly, labs 3 months 2. Remain off NSAIDs, DC'd meloxicam from list. Encouraged Tylenol use PRN pain 3. Improve hydration 4. Control BP 5. Follow-up as needed

## 2017-04-10 ENCOUNTER — Ambulatory Visit: Payer: Medicare Other | Admitting: Podiatry

## 2017-04-21 ENCOUNTER — Encounter: Payer: Self-pay | Admitting: Podiatry

## 2017-04-21 ENCOUNTER — Ambulatory Visit (INDEPENDENT_AMBULATORY_CARE_PROVIDER_SITE_OTHER): Payer: Medicare Other | Admitting: Podiatry

## 2017-04-21 DIAGNOSIS — M79676 Pain in unspecified toe(s): Secondary | ICD-10-CM

## 2017-04-21 DIAGNOSIS — B351 Tinea unguium: Secondary | ICD-10-CM

## 2017-04-21 NOTE — Progress Notes (Signed)
   SUBJECTIVE Patient  presents to office today complaining of elongated, thickened nails. Pain while ambulating in shoes. Patient is unable to trim their own nails.   OBJECTIVE General Patient is awake, alert, and oriented x 3 and in no acute distress. Derm Skin is dry and supple bilateral. Negative open lesions or macerations. Remaining integument unremarkable. Nails are tender, long, thickened and dystrophic with subungual debris, consistent with onychomycosis, 1-5 bilateral. No signs of infection noted. Vasc  DP and PT pedal pulses palpable bilaterally. Temperature gradient within normal limits.  Neuro Epicritic and protective threshold sensation diminished bilaterally.  Musculoskeletal Exam No symptomatic pedal deformities noted bilateral. Muscular strength within normal limits.  ASSESSMENT 1. Onychodystrophic nails 1-5 bilateral with hyperkeratosis of nails.  2. Onychomycosis of nail due to dermatophyte bilateral 3. Pain in foot bilateral  PLAN OF CARE 1. Patient evaluated today.  2. Instructed to maintain good pedal hygiene and foot care.  3. Mechanical debridement of nails 1-5 bilaterally performed using a nail nipper. Filed with dremel without incident.  4. Return to clinic in 3 mos.    Casondra Gasca M. Aasha Dina, DPM [facility]  Dr. Abbegayle Denault M. Mishal Probert, DPM    [address]                                        [place], [place] [address]                Office [phone]  Fax [phone]      

## 2017-04-30 DIAGNOSIS — D0439 Carcinoma in situ of skin of other parts of face: Secondary | ICD-10-CM | POA: Diagnosis not present

## 2017-04-30 DIAGNOSIS — Z08 Encounter for follow-up examination after completed treatment for malignant neoplasm: Secondary | ICD-10-CM | POA: Diagnosis not present

## 2017-04-30 DIAGNOSIS — Z85828 Personal history of other malignant neoplasm of skin: Secondary | ICD-10-CM | POA: Diagnosis not present

## 2017-04-30 DIAGNOSIS — D485 Neoplasm of uncertain behavior of skin: Secondary | ICD-10-CM | POA: Diagnosis not present

## 2017-05-12 ENCOUNTER — Other Ambulatory Visit: Payer: Self-pay

## 2017-05-12 MED ORDER — VIAGRA 100 MG PO TABS: ORAL_TABLET | ORAL | 0 refills | Status: DC

## 2017-05-25 DIAGNOSIS — H401132 Primary open-angle glaucoma, bilateral, moderate stage: Secondary | ICD-10-CM | POA: Diagnosis not present

## 2017-06-01 ENCOUNTER — Encounter: Payer: Self-pay | Admitting: Hematology and Oncology

## 2017-06-01 ENCOUNTER — Inpatient Hospital Stay: Payer: Medicare Other | Attending: Hematology and Oncology

## 2017-06-01 ENCOUNTER — Inpatient Hospital Stay (HOSPITAL_BASED_OUTPATIENT_CLINIC_OR_DEPARTMENT_OTHER): Payer: Medicare Other | Admitting: Hematology and Oncology

## 2017-06-01 VITALS — BP 142/81 | HR 76 | Temp 98.5°F | Resp 18 | Wt 159.2 lb

## 2017-06-01 DIAGNOSIS — Z79899 Other long term (current) drug therapy: Secondary | ICD-10-CM

## 2017-06-01 DIAGNOSIS — I10 Essential (primary) hypertension: Secondary | ICD-10-CM | POA: Insufficient documentation

## 2017-06-01 DIAGNOSIS — E871 Hypo-osmolality and hyponatremia: Secondary | ICD-10-CM | POA: Insufficient documentation

## 2017-06-01 DIAGNOSIS — E079 Disorder of thyroid, unspecified: Secondary | ICD-10-CM | POA: Diagnosis not present

## 2017-06-01 DIAGNOSIS — R600 Localized edema: Secondary | ICD-10-CM | POA: Diagnosis not present

## 2017-06-01 DIAGNOSIS — C8299 Follicular lymphoma, unspecified, extranodal and solid organ sites: Secondary | ICD-10-CM

## 2017-06-01 DIAGNOSIS — F418 Other specified anxiety disorders: Secondary | ICD-10-CM

## 2017-06-01 DIAGNOSIS — C8583 Other specified types of non-Hodgkin lymphoma, intra-abdominal lymph nodes: Secondary | ICD-10-CM | POA: Insufficient documentation

## 2017-06-01 DIAGNOSIS — C8200 Follicular lymphoma grade I, unspecified site: Secondary | ICD-10-CM

## 2017-06-01 LAB — CBC WITH DIFFERENTIAL/PLATELET
Basophils Absolute: 0.1 10*3/uL (ref 0–0.1)
Basophils Relative: 1 %
Eosinophils Absolute: 0.1 10*3/uL (ref 0–0.7)
Eosinophils Relative: 1 %
HCT: 41.6 % (ref 40.0–52.0)
Hemoglobin: 14.7 g/dL (ref 13.0–18.0)
Lymphocytes Relative: 31 %
Lymphs Abs: 3.2 10*3/uL (ref 1.0–3.6)
MCH: 30.6 pg (ref 26.0–34.0)
MCHC: 35.4 g/dL (ref 32.0–36.0)
MCV: 86.5 fL (ref 80.0–100.0)
Monocytes Absolute: 1.1 10*3/uL — ABNORMAL HIGH (ref 0.2–1.0)
Monocytes Relative: 11 %
Neutro Abs: 5.8 10*3/uL (ref 1.4–6.5)
Neutrophils Relative %: 56 %
Platelets: 243 10*3/uL (ref 150–440)
RBC: 4.82 MIL/uL (ref 4.40–5.90)
RDW: 14.2 % (ref 11.5–14.5)
WBC: 10.4 10*3/uL (ref 3.8–10.6)

## 2017-06-01 LAB — COMPREHENSIVE METABOLIC PANEL
ALT: 19 U/L (ref 17–63)
AST: 27 U/L (ref 15–41)
Albumin: 4.1 g/dL (ref 3.5–5.0)
Alkaline Phosphatase: 64 U/L (ref 38–126)
Anion gap: 9 (ref 5–15)
BUN: 32 mg/dL — ABNORMAL HIGH (ref 6–20)
CO2: 22 mmol/L (ref 22–32)
Calcium: 9.1 mg/dL (ref 8.9–10.3)
Chloride: 101 mmol/L (ref 101–111)
Creatinine, Ser: 1.47 mg/dL — ABNORMAL HIGH (ref 0.61–1.24)
GFR calc Af Amer: 48 mL/min — ABNORMAL LOW (ref >60)
GFR calc non Af Amer: 42 mL/min — ABNORMAL LOW (ref >60)
Glucose, Bld: 120 mg/dL — ABNORMAL HIGH (ref 65–99)
Potassium: 4.4 mmol/L (ref 3.5–5.1)
Sodium: 132 mmol/L — ABNORMAL LOW (ref 135–145)
Total Bilirubin: 1 mg/dL (ref 0.3–1.2)
Total Protein: 7 g/dL (ref 6.5–8.1)

## 2017-06-01 LAB — LACTATE DEHYDROGENASE: LDH: 170 U/L (ref 98–192)

## 2017-06-01 LAB — URIC ACID: Uric Acid, Serum: 6.4 mg/dL (ref 4.4–7.6)

## 2017-06-01 NOTE — Progress Notes (Signed)
Patient offers no complaints today. 

## 2017-06-01 NOTE — Progress Notes (Signed)
[facility] day:  06/01/17  Chief Complaint: Manuel Reynolds is a 83 y.o. male with small cleaved nodular lymphocytic non-Hodgkin's lymphoma who is seen for yearly assessment.  HPI:  The patient was last seen in the medical oncology clinic on 05/26/2016.  At that time, he was seen for initial assessment by me.  He denied any complaint.  Exam revealed no adenopathy or hepatosplenomegaly.  CBC revealed a hematocrit of 43.5, hemoglobin 15.1, MCV 87.6, platelets 204,000, WBC 9,100 with an ANC of 4800.  Creatinine was 1.43.  During the interim, patient reports that he is feeling fine. He denies any concerns in the clinic today. Patient with recent dermatological issues to both of his cheeks. He is currently using Efudex (topical 5FU) that resulted in skin irritation; continues to be followed by dermatology.    Past Medical History:  Diagnosis Date  . Anxiety   . Depression   . Hypertension   . 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 Thyroid disease     Past Surgical History:  Procedure Laterality Date  . EYE SURGERY  2010   cataract    Family History  Problem Relation Age of Onset  . Hypertension Father   . Cancer Sister     Social History:  reports that he has never smoked. He has never used smokeless tobacco. He reports that he does not drink alcohol or use drugs.  The patient is accompanied by his wife,  [name], today.  Allergies:  Allergies  Allergen Reactions  . Sulfa Antibiotics Rash    Current Medications: Current Outpatient Prescriptions  Medication Sig Dispense Refill  . acebutolol (SECTRAL) 200 MG capsule TAKE 1 CAPSULE(200 MG) BY MOUTH DAILY 30 capsule 12  . amLODipine (NORVASC) 5 MG tablet TAKE 1 TABLET(5 MG) BY MOUTH DAILY 30 tablet 12  . baclofen (LIORESAL) 10 MG tablet TAKE 1/2 TABLET(5 MG) BY MOUTH TWICE DAILY 30 tablet 0  . COMBIGAN 0.2-0.5 % ophthalmic solution INT 1 GTT IN OU BID  5  . diazepam  (VALIUM) 2 MG tablet Take 1 tablet (2 mg total) by mouth as needed. 15 tablet 0  . fluorouracil (EFUDEX) 5 % cream Apply 1 application topically 2 (two) times daily after a meal.  0  . ketoconazole (NIZORAL) 2 % shampoo   6  . latanoprost (XALATAN) 0.005 % ophthalmic solution 1 drop at bedtime.    Marland Kitchen levothyroxine (SYNTHROID, LEVOTHROID) 50 MCG tablet TAKE 1 TABLET(50 MCG) BY MOUTH DAILY BEFORE BREAKFAST 30 tablet 12  . losartan (COZAAR) 50 MG tablet TAKE 1 TABLET(50 MG) BY MOUTH DAILY 30 tablet 12  . sertraline (ZOLOFT) 25 MG tablet TAKE 1 TABLET(25 MG) BY MOUTH DAILY 30 tablet 6  . simvastatin (ZOCOR) 40 MG tablet Take 40 mg by mouth daily.    Marland Kitchen spironolactone (ALDACTONE) 25 MG tablet TAKE 1 TABLET(25 MG) BY MOUTH DAILY 90 tablet 3  . VIAGRA 100 MG tablet TAKE 1/2 TABLET BY MOUTH AS NEEDED FOR ERECTILE DYSFUNCTION. 6 tablet 0   No current facility-administered medications for this visit.     Review of Systems:  GENERAL:  Feels good.  Active.  No fevers, sweats or weight loss. PERFORMANCE STATUS (ECOG):  0 HEENT:  No visual changes, runny nose, sore throat, mouth sores or tenderness. Lungs: No shortness of breath or cough.  No hemoptysis. Cardiac:  No chest pain, palpitations, orthopnea, or PND. GI:  No nausea, vomiting, diarrhea, constipation, melena or hematochezia. GU:  No urgency, frequency, dysuria, or hematuria. Musculoskeletal:  No back pain.  No joint pain.  No muscle tenderness. Extremities:  Mild lower extremity swelling x 1 year. Skin:  Facial rash due to topical 5FU.  Neuro:  No headache, numbness or weakness, balance or coordination issues. Endocrine:  No diabetes.  Thyroid disease on Synthroid.  No hot flashes or night sweats. Psych:  No mood changes, depression or anxiety. Pain:  No focal pain. Review of systems:  All other systems reviewed and found to be negative.  Physical Exam: Blood pressure (!) 142/81, pulse 76, temperature 98.5 F (36.9 C), temperature source  Tympanic, resp. rate 18, weight 159 lb 3 oz (72.2 kg). GENERAL:  Well developed, well nourished, sitting comfortably in the exam room in no acute distress. MENTAL STATUS:  Alert and oriented to person, place and time. HEAD:  Pearline Cables hair.  Normocephalic, atraumatic, face symmetric, no Cushingoid features. EYES:  Glasses.  Blue eyes.  Pupils equal round and reactive to light and accomodation.  No conjunctivitis or scleral icterus. ENT:  Oropharynx clear without lesion.  Missing several teeth.  Tongue normal. Mucous membranes moist.  RESPIRATORY:  Clear to auscultation without rales, wheezes or rhonchi. CARDIOVASCULAR:  Regular rate and rhythm without murmur, rub or gallop. ABDOMEN:  Soft, non-tender, with active bowel sounds, and no hepatosplenomegaly.  No masses. SKIN:  Erythema cheeks bilaterally s/p 5FU cream.  No rashes, ulcers or lesions. EXTREMITIES:  Trace chronic edema (left > right).  No skin discoloration or tenderness.  No palpable cords. LYMPH NODES: No palpable cervical, supraclavicular, axillary or inguinal adenopathy  NEUROLOGICAL: Unremarkable. PSYCH:  Appropriate.  Appointment on 06/01/2017  Component Date Value Ref Range Status  . WBC 06/01/2017 10.4  3.8 - 10.6 K/uL Final  . RBC 06/01/2017 4.82  4.40 - 5.90 MIL/uL Final  . Hemoglobin 06/01/2017 14.7  13.0 - 18.0 g/dL Final  . HCT 06/01/2017 41.6  40.0 - 52.0 % Final  . MCV 06/01/2017 86.5  80.0 - 100.0 fL Final  . MCH 06/01/2017 30.6  26.0 - 34.0 pg Final  . MCHC 06/01/2017 35.4  32.0 - 36.0 g/dL Final  . RDW 06/01/2017 14.2  11.5 - 14.5 % Final  . Platelets 06/01/2017 243  150 - 440 K/uL Final  . Neutrophils Relative % 06/01/2017 56  % Final  . Neutro Abs 06/01/2017 5.8  1.4 - 6.5 K/uL Final  . Lymphocytes Relative 06/01/2017 31  % Final  . Lymphs Abs 06/01/2017 3.2  1.0 - 3.6 K/uL Final  . Monocytes Relative 06/01/2017 11  % Final  . Monocytes Absolute 06/01/2017 1.1* 0.2 - 1.0 K/uL Final  . Eosinophils Relative  06/01/2017 1  % Final  . Eosinophils Absolute 06/01/2017 0.1  0 - 0.7 K/uL Final  . Basophils Relative 06/01/2017 1  % Final  . Basophils Absolute 06/01/2017 0.1  0 - 0.1 K/uL Final  . Sodium 06/01/2017 132* 135 - 145 mmol/L Final  . Potassium 06/01/2017 4.4  3.5 - 5.1 mmol/L Final  . Chloride 06/01/2017 101  101 - 111 mmol/L Final  . CO2 06/01/2017 22  22 - 32 mmol/L Final  . Glucose, Bld 06/01/2017 120* 65 - 99 mg/dL Final  . BUN 06/01/2017 32* 6 - 20 mg/dL Final  . Creatinine, Ser 06/01/2017 1.47* 0.61 - 1.24 mg/dL Final  . Calcium 06/01/2017 9.1  8.9 - 10.3 mg/dL Final  . Total Protein 06/01/2017 7.0  6.5 - 8.1 g/dL Final  . Albumin 06/01/2017 4.1  3.5 -  5.0 g/dL Final  . AST 06/01/2017 27  15 - 41 U/L Final  . ALT 06/01/2017 19  17 - 63 U/L Final  . Alkaline Phosphatase 06/01/2017 64  38 - 126 U/L Final  . Total Bilirubin 06/01/2017 1.0  0.3 - 1.2 mg/dL Final  . GFR calc non Af Amer 06/01/2017 42* >60 mL/min Final  . GFR calc Af Amer 06/01/2017 48* >60 mL/min Final   Comment: (NOTE) The eGFR has been calculated using the CKD EPI equation. This calculation has not been validated in all clinical situations. eGFR's persistently <60 mL/min signify possible Chronic Kidney Disease.   . Anion gap 06/01/2017 9  5 - 15 Final  . LDH 06/01/2017 170  98 - 192 U/L Final  . Uric Acid, Serum 06/01/2017 6.4  4.4 - 7.6 mg/dL Final    Assessment:  Elmus Mathes is a 83 y.o. male with nodular lymphocytic non-Hodgkin's lymphoma diagnosed in 1998 with abdominal adenopathy.  He had B symptoms.  Original stage is unknown.  He was treated with CHOP followed by interferon x 1 year.  His disease recurred.  He was treated with fludarabine, Rituxan, and CNOP.  His disease recurred in 2003.  He was treated with Bexxar (tositumomab I-131 radioconjugate) in 2003 at [facility].  PET scan in 2005 was normal.  Symptomatically, he denies any complaint.  Exam reveals no adenopathy or hepatosplenomegaly. He has some  facial skin irritation s/p Efudex cream (5FU).  He has slight hyponatremia (132).   Plan: 1.  Labs today:  CBC with diff, CMP, LDH, uric acid. 2.  Discuss plan for yearly follow-up unless any concerns; agrees to annual follow ups.  3.  Hyponatremia - add electrolyte oral replacement beverages. He has had to use salt tabs in the past.  No need for salt tabs at this time.  4.  RTC in 1 year for MD assessment and labs (CBC with diff, CMP, LDH, uric acid).   Lequita Asal, MD  06/01/2017, 3:25 PM

## 2017-06-02 DIAGNOSIS — H401132 Primary open-angle glaucoma, bilateral, moderate stage: Secondary | ICD-10-CM | POA: Diagnosis not present

## 2017-06-16 ENCOUNTER — Ambulatory Visit (INDEPENDENT_AMBULATORY_CARE_PROVIDER_SITE_OTHER): Payer: Medicare Other

## 2017-06-16 ENCOUNTER — Other Ambulatory Visit: Payer: Medicare Other

## 2017-06-16 ENCOUNTER — Other Ambulatory Visit: Payer: Self-pay | Admitting: Family Medicine

## 2017-06-16 VITALS — BP 108/70 | HR 68 | Temp 98.6°F | Resp 16 | Ht 65.0 in | Wt 158.6 lb

## 2017-06-16 DIAGNOSIS — Z0000 Encounter for general adult medical examination without abnormal findings: Secondary | ICD-10-CM

## 2017-06-16 DIAGNOSIS — R7309 Other abnormal glucose: Secondary | ICD-10-CM | POA: Diagnosis not present

## 2017-06-16 DIAGNOSIS — E034 Atrophy of thyroid (acquired): Secondary | ICD-10-CM | POA: Diagnosis not present

## 2017-06-16 DIAGNOSIS — E782 Mixed hyperlipidemia: Secondary | ICD-10-CM | POA: Diagnosis not present

## 2017-06-16 LAB — LIPID PANEL
Cholesterol: 153 mg/dL (ref <200)
HDL: 35 mg/dL — ABNORMAL LOW (ref >40)
LDL Cholesterol: 93 mg/dL (ref <100)
Total CHOL/HDL Ratio: 4.4 Ratio (ref <5.0)
Triglycerides: 124 mg/dL (ref <150)
VLDL: 25 mg/dL (ref <30)

## 2017-06-16 LAB — TSH: TSH: 2.74 mIU/L (ref 0.40–4.50)

## 2017-06-16 NOTE — Patient Instructions (Addendum)
Manuel Reynolds , Thank you for taking time to come for your Medicare Wellness Visit. I appreciate your ongoing commitment to your health goals. Please review the following plan we discussed and let me know if I can assist you in the future.   Screening recommendations/referrals: Colonoscopy: no longer required Recommended yearly ophthalmology/optometry visit for glaucoma screening and checkup Recommended yearly dental visit for hygiene and checkup  Vaccinations: Influenza vaccine: up to date, due 07/2017 Pneumococcal vaccine: up to date Tdap vaccine: up to date  Shingles vaccine: up to date   Advanced directives: Please bring a copy of your health care power of attorney and living will to the office at your convenience.  Conditions/risks identified: Recommend drinking at least 5-6 glasses of water a day   Next appointment: Follow up on 06/19/2017 at 10am with [facility]. Follow up in one year for your annual wellness exam.   Preventive Care 65 Years and Older, Male Preventive care refers to lifestyle choices and visits with your health care provider that can promote health and wellness. What does preventive care include?  A yearly physical exam. This is also called an annual well check.  Dental exams once or twice a year.  Routine eye exams. Ask your health care provider how often you should have your eyes checked.  Personal lifestyle choices, including:  Daily care of your teeth and gums.  Regular physical activity.  Eating a healthy diet.  Avoiding tobacco and drug use.  Limiting alcohol use.  Practicing safe sex.  Taking low doses of aspirin every day.  Taking vitamin and mineral supplements as recommended by your health care provider. What happens during an annual well check? The services and screenings done by your health care provider during your annual well check will depend on your age, overall health, lifestyle risk factors, and family history of  disease. Counseling  Your health care provider may ask you questions about your:  Alcohol use.  Tobacco use.  Drug use.  Emotional well-being.  Home and relationship well-being.  Sexual activity.  Eating habits.  History of falls.  Memory and ability to understand (cognition).  Work and work Statistician. Screening  You may have the following tests or measurements:  Height, weight, and BMI.  Blood pressure.  Lipid and cholesterol levels. These may be checked every 5 years, or more frequently if you are over 64 years old.  Skin check.  Lung cancer screening. You may have this screening every year starting at age 82 if you have a 30-pack-year history of smoking and currently smoke or have quit within the past 15 years.  Fecal occult blood test (FOBT) of the stool. You may have this test every year starting at age 38.  Flexible sigmoidoscopy or colonoscopy. You may have a sigmoidoscopy every 5 years or a colonoscopy every 10 years starting at age 86.  Prostate cancer screening. Recommendations will vary depending on your family history and other risks.  Hepatitis C blood test.  Hepatitis B blood test.  Sexually transmitted disease (STD) testing.  Diabetes screening. This is done by checking your blood sugar (glucose) after you have not eaten for a while (fasting). You may have this done every 1-3 years.  Abdominal aortic aneurysm (AAA) screening. You may need this if you are a current or former smoker.  Osteoporosis. You may be screened starting at age 17 if you are at high risk. Talk with your health care provider about your test results, treatment options, and if necessary, the need  for more tests. Vaccines  Your health care provider may recommend certain vaccines, such as:  Influenza vaccine. This is recommended every year.  Tetanus, diphtheria, and acellular pertussis (Tdap, Td) vaccine. You may need a Td booster every 10 years.  Zoster vaccine. You may  need this after age 56.  Pneumococcal 13-valent conjugate (PCV13) vaccine. One dose is recommended after age 61.  Pneumococcal polysaccharide (PPSV23) vaccine. One dose is recommended after age 67. Talk to your health care provider about which screenings and vaccines you need and how often you need them. This information is not intended to replace advice given to you by your health care provider. Make sure you discuss any questions you have with your health care provider. Document Released: 12/07/2015 Document Revised: 07/30/2016 Document Reviewed: 09/11/2015 Elsevier Interactive Patient Education  2017 Media Prevention in the Home Falls can cause injuries. They can happen to people of all ages. There are many things you can do to make your home safe and to help prevent falls. What can I do on the outside of my home?  Regularly fix the edges of walkways and driveways and fix any cracks.  Remove anything that might make you trip as you walk through a door, such as a raised step or threshold.  Trim any bushes or trees on the path to your home.  Use bright outdoor lighting.  Clear any walking paths of anything that might make someone trip, such as rocks or tools.  Regularly check to see if handrails are loose or broken. Make sure that both sides of any steps have handrails.  Any raised decks and porches should have guardrails on the edges.  Have any leaves, snow, or ice cleared regularly.  Use sand or salt on walking paths during winter.  Clean up any spills in your garage right away. This includes oil or grease spills. What can I do in the bathroom?  Use night lights.  Install grab bars by the toilet and in the tub and shower. Do not use towel bars as grab bars.  Use non-skid mats or decals in the tub or shower.  If you need to sit down in the shower, use a plastic, non-slip stool.  Keep the floor dry. Clean up any water that spills on the floor as soon as it  happens.  Remove soap buildup in the tub or shower regularly.  Attach bath mats securely with double-sided non-slip rug tape.  Do not have throw rugs and other things on the floor that can make you trip. What can I do in the bedroom?  Use night lights.  Make sure that you have a light by your bed that is easy to reach.  Do not use any sheets or blankets that are too big for your bed. They should not hang down onto the floor.  Have a firm chair that has side arms. You can use this for support while you get dressed.  Do not have throw rugs and other things on the floor that can make you trip. What can I do in the kitchen?  Clean up any spills right away.  Avoid walking on wet floors.  Keep items that you use a lot in easy-to-reach places.  If you need to reach something above you, use a strong step stool that has a grab bar.  Keep electrical cords out of the way.  Do not use floor polish or wax that makes floors slippery. If you must use wax, use  non-skid floor wax.  Do not have throw rugs and other things on the floor that can make you trip. What can I do with my stairs?  Do not leave any items on the stairs.  Make sure that there are handrails on both sides of the stairs and use them. Fix handrails that are broken or loose. Make sure that handrails are as long as the stairways.  Check any carpeting to make sure that it is firmly attached to the stairs. Fix any carpet that is loose or worn.  Avoid having throw rugs at the top or bottom of the stairs. If you do have throw rugs, attach them to the floor with carpet tape.  Make sure that you have a light switch at the top of the stairs and the bottom of the stairs. If you do not have them, ask someone to add them for you. What else can I do to help prevent falls?  Wear shoes that:  Do not have high heels.  Have rubber bottoms.  Are comfortable and fit you well.  Are closed at the toe. Do not wear sandals.  If you  use a stepladder:  Make sure that it is fully opened. Do not climb a closed stepladder.  Make sure that both sides of the stepladder are locked into place.  Ask someone to hold it for you, if possible.  Clearly mark and make sure that you can see:  Any grab bars or handrails.  First and last steps.  Where the edge of each step is.  Use tools that help you move around (mobility aids) if they are needed. These include:  Canes.  Walkers.  Scooters.  Crutches.  Turn on the lights when you go into a dark area. Replace any light bulbs as soon as they burn out.  Set up your furniture so you have a clear path. Avoid moving your furniture around.  If any of your floors are uneven, fix them.  If there are any pets around you, be aware of where they are.  Review your medicines with your doctor. Some medicines can make you feel dizzy. This can increase your chance of falling. Ask your doctor what other things that you can do to help prevent falls. This information is not intended to replace advice given to you by your health care provider. Make sure you discuss any questions you have with your health care provider. Document Released: 09/06/2009 Document Revised: 04/17/2016 Document Reviewed: 12/15/2014 Elsevier Interactive Patient Education  2017 [employer].

## 2017-06-16 NOTE — Progress Notes (Signed)
Subjective:   Yossi Hinchman is a 83 y.o. male who presents for Medicare Annual/Subsequent preventive examination.  Review of Systems:  Cardiac Risk Factors include: male gender;advanced age (>63men, >56 women);dyslipidemia;hypertension     Objective:    Vitals: BP 108/70 (BP Location: Right Arm, Patient Position: Sitting)   Pulse 68   Temp 98.6 F (37 C)   Resp 16   Ht 5\' 5"  (1.651 m)   Wt 158 lb 9.6 oz (71.9 kg)   BMI 26.39 kg/m   Body mass index is 26.39 kg/m.  Tobacco History  Smoking Status  . Never Smoker  Smokeless Tobacco  . Never Used     Counseling given: Not Answered   Past Medical History:  Diagnosis Date  . Anxiety   . Depression   . Hypertension   . 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 Thyroid disease    Past Surgical History:  Procedure Laterality Date  . EYE SURGERY  2010   cataract   Family History  Problem Relation Age of Onset  . Hypertension Father   . Hypertension Mother   . Lung cancer Sister   . Diabetes Maternal Aunt   . Diabetes Maternal Uncle   . Diabetes Maternal Aunt   . Diabetes Maternal Uncle    History  Sexual Activity  . Sexual activity: Not on file    Outpatient Encounter Prescriptions as of 06/16/2017  Medication Sig  . acebutolol (SECTRAL) 200 MG capsule TAKE 1 CAPSULE(200 MG) BY MOUTH DAILY  . amLODipine (NORVASC) 5 MG tablet TAKE 1 TABLET(5 MG) BY MOUTH DAILY  . COMBIGAN 0.2-0.5 % ophthalmic solution INT 1 GTT IN OU BID  . diazepam (VALIUM) 2 MG tablet Take 1 tablet (2 mg total) by mouth as needed.  Marland Kitchen ketoconazole (NIZORAL) 2 % shampoo   . latanoprost (XALATAN) 0.005 % ophthalmic solution 1 drop at bedtime.  Marland Kitchen levothyroxine (SYNTHROID, LEVOTHROID) 50 MCG tablet TAKE 1 TABLET(50 MCG) BY MOUTH DAILY BEFORE BREAKFAST  . losartan (COZAAR) 50 MG tablet TAKE 1 TABLET(50 MG) BY MOUTH DAILY  . sertraline (ZOLOFT) 25 MG tablet TAKE 1 TABLET(25 MG) BY MOUTH DAILY  . simvastatin (ZOCOR) 40 MG tablet  Take 40 mg by mouth daily.  Marland Kitchen spironolactone (ALDACTONE) 25 MG tablet TAKE 1 TABLET(25 MG) BY MOUTH DAILY  . VIAGRA 100 MG tablet TAKE 1/2 TABLET BY MOUTH AS NEEDED FOR ERECTILE DYSFUNCTION.  . baclofen (LIORESAL) 10 MG tablet TAKE 1/2 TABLET(5 MG) BY MOUTH TWICE DAILY (Patient not taking: Reported on 06/16/2017)  . [DISCONTINUED] fluorouracil (EFUDEX) 5 % cream Apply 1 application topically 2 (two) times daily after a meal.   No facility-administered encounter medications on file as of 06/16/2017.     Activities of Daily Living In your present state of health, do you have any difficulty performing the following activities: 06/16/2017 11/11/2016  Hearing? Y N  Vision? Y N  Difficulty concentrating or making decisions? Y N  Walking or climbing stairs? N Y  Dressing or bathing? N N  Doing errands, shopping? N N  Preparing Food and eating ? N -  Using the Toilet? N -  In the past six months, have you accidently leaked urine? N -  Do you have problems with loss of bowel control? N -  Managing your Medications? N -  Managing your Finances? N -  Housekeeping or managing your Housekeeping? N -  Some recent data might be hidden    Patient Care Team: [name], DO  as PCP - General (Family Medicine) Oneta Rack, MD (Dermatology) Lequita Asal, MD as Referring Physician (Hematology and Oncology) Edrick Kins, DPM as Consulting Physician (Podiatry)   Assessment:     Exercise Activities and Dietary recommendations Current Exercise Habits: The patient does not participate in regular exercise at present (yard work ), Exercise limited by: None identified  Goals    . Increase water intake          Recommend drinking at least 5-6 glasses of water a day       Fall Risk Fall Risk  06/16/2017 11/11/2016 03/17/2016 07/23/2015 05/11/2015  Falls in the past year? Yes No No No No  Number falls in past yr: 1 - - - -  Injury with Fall? No - - - -  Follow up Falls  prevention discussed - - - -   Depression Screen PHQ 2/9 Scores 06/16/2017 11/11/2016 03/17/2016 07/23/2015  PHQ - 2 Score 0 0 0 0    Cognitive Function     6CIT Screen 06/16/2017  What Year? 0 points  What month? 0 points  What time? 0 points  Count back from 20 0 points  Months in reverse 0 points  Repeat phrase 0 points  Total Score 0    Immunization History  Administered Date(s) Administered  . Influenza, High Dose Seasonal PF 08/17/2015  . Influenza-Unspecified 08/18/2016  . Pneumococcal Conjugate-13 10/08/2007  . Pneumococcal Polysaccharide-23 11/03/2013  . Zoster 11/03/2013   Screening Tests Health Maintenance  Topic Date Due  . INFLUENZA VACCINE  06/24/2017  . TETANUS/TDAP  09/16/2025  . PNA vac Low Risk Adult  Completed      Plan:    I have personally reviewed and addressed the Medicare Annual Wellness questionnaire and have noted the following in the patient's chart:  A. Medical and social history B. Use of alcohol, tobacco or illicit drugs  C. Current medications and supplements D. Functional ability and status E.  Nutritional status F.  Physical activity G. Advance directives H. List of other physicians I.  Hospitalizations, surgeries, and ER visits in previous 12 months J.  [place] such as hearing and vision if needed, cognitive and depression L. Referrals and appointments  In addition, I have reviewed and discussed with patient certain preventive protocols, quality metrics, and best practice recommendations. A written personalized care plan for preventive services as well as general preventive health recommendations were provided to patient.   Signed,  Tyler Aas, LPN Nurse Health Advisor   MD Recommendations: none

## 2017-06-17 LAB — HEMOGLOBIN A1C
Hgb A1c MFr Bld: 5.7 % — ABNORMAL HIGH (ref <5.7)
Mean Plasma Glucose: 117 mg/dL

## 2017-06-19 ENCOUNTER — Encounter: Payer: Self-pay | Admitting: Family Medicine

## 2017-06-19 ENCOUNTER — Ambulatory Visit (INDEPENDENT_AMBULATORY_CARE_PROVIDER_SITE_OTHER): Payer: Medicare Other | Admitting: Family Medicine

## 2017-06-19 ENCOUNTER — Other Ambulatory Visit: Payer: Self-pay | Admitting: Family Medicine

## 2017-06-19 VITALS — BP 132/79 | HR 72 | Temp 98.0°F | Resp 16 | Ht 66.0 in | Wt 159.0 lb

## 2017-06-19 DIAGNOSIS — I10 Essential (primary) hypertension: Secondary | ICD-10-CM | POA: Diagnosis not present

## 2017-06-19 DIAGNOSIS — N183 Chronic kidney disease, stage 3 unspecified: Secondary | ICD-10-CM

## 2017-06-19 DIAGNOSIS — E034 Atrophy of thyroid (acquired): Secondary | ICD-10-CM | POA: Diagnosis not present

## 2017-06-19 DIAGNOSIS — R442 Other hallucinations: Secondary | ICD-10-CM | POA: Insufficient documentation

## 2017-06-19 DIAGNOSIS — F419 Anxiety disorder, unspecified: Secondary | ICD-10-CM | POA: Diagnosis not present

## 2017-06-19 DIAGNOSIS — E782 Mixed hyperlipidemia: Secondary | ICD-10-CM

## 2017-06-19 DIAGNOSIS — R7309 Other abnormal glucose: Secondary | ICD-10-CM

## 2017-06-19 MED ORDER — SERTRALINE HCL 25 MG PO TABS: 25.0000 mg | ORAL_TABLET | Freq: Every day | ORAL | 11 refills | Status: DC

## 2017-06-19 NOTE — Assessment & Plan Note (Signed)
Mostly well controlled cholesterol on statin and limited lifestyle Last lipid panel 05/2017 ASCVD 10 yr risk score elevated  Plan: 1. Continue current meds - Simvastatin 40mg  2. May consider continue ASA 81mg  for primary ASCVD risk reduction 3. Encourage improved lifestyle - low carb/cholesterol, reduce portion size, continue improving regular exercise

## 2017-06-19 NOTE — Patient Instructions (Addendum)
Thank you for coming to the clinic today.  1. Keep up the good work 2. Try to stay active and eat balanced diet, drink plenty of water, limit sugary foods 3. Pre-diabetes, A1c 5.7, slightly higher, we will check again 4. Thyroid normal, no change 5. Sleep hallucinations are most likely normal. Let me know if change or other concerns. 6. Switch Sertraline 25mg  daily to MORNING, new rx sent to pharmacy  DUE for NON fasting BLOOD WORK eat or drink as normal  SCHEDULE "Lab Only" visit in the morning at the clinic for lab draw in 6 MONTHS   - Make sure Lab Only appointment is at about 1 week before your next appointment, so that results will be available  For Lab Results, once available within 2-3 days of blood draw, you can can log in to MyChart online to view your results and a brief explanation. Also, we can discuss results at next follow-up visit.   Please schedule a Follow-up Appointment to: Return in about 6 months (around 12/20/2017) for Pre-Diabetes, Hypothyroid.  If you have any other questions or concerns, please feel free to call the clinic or send a message through [place]. You may also schedule an earlier appointment if necessary.  Additionally, you may be receiving a survey about your experience at our clinic within a few days to 1 week by e-mail or mail. We value your feedback.  Nobie Putnam, DO [place]

## 2017-06-19 NOTE — Progress Notes (Signed)
Subjective:    Patient ID: Manuel Reynolds, male    DOB: 13-Nov-1930, 83 y.o.   MRN: [id number]  Manuel Reynolds is a 83 y.o. male presenting on 06/19/2017 for Annual Exam   HPI   Here for Annual Physical and lab review  Abnormal Glucose Recently had elevated non fasting glucose 120. Also labs with A1c 5.7, no prior for comparison CBGs: None Meds: Never on med Currently on ARB Denies hypoglycemia, polyuria, visual changes, numbness or tingling.  Hypothyroidism - Chronic problem since 2003, secondary to radiation therapy for lymphoma, then only had problem with low thyroid for past 2-3 years - Continues Levothyroxine 4mcg daily - Last TSH 2.74 (05/2017)  Anxiety, Chronic - Reports his chronic anxiety is stable to well controlled on medicine. Reviewed meds, and he still has some old left over pills of Diazepam 2mg  (again was only given #15 pills 0 refills in 2016) says he has incase he rarely needs for significant anxiety, such as if a "tornado" happened. - Since last visit he was recommended to take Sertraline 25mg  daily in evening (asking if related to hallucinations), instead of PRN, and is doing well on it without any new concerns, thinks it is helping to stabilize his symptoms - Denies significant generalized anxiety, panic, depression, insomnia  Hypnopompic Hallucinations - Reports recent concern with episodes of "hallucination" when he wakes up overnight or is waking up in morning, will feel a presence in the room and see a "figure", but his wife admits that he is often asleep or wakes up overnight still partially asleep when this happens, never has  Hallucinations during the day or while fully awake. Denies any auditory or other hallucinations - Admits to sleeping well  HYPERLIPIDEMIA: - Reports no concerns. Last lipid panel 05/2017, improved control, still low HDL, improved LDL, TG - Currently taking Simvastatin 40mg , tolerating well without side effects or myalgias  CHRONIC HTN,  complicated by CKD-III Reports no concerns today. Current Meds - Losartan 50mg  daily, Spironolasctone 25mg , Amlodapine 5mg , Acetabutolol 200mg  (due to history of tachycardia) Reports good compliance, took meds today. Tolerating well, w/o complaints. Additional concern recent blood work with slightly low Na Denies CP, dyspnea, HA, edema, dizziness / lightheadedness  Depression screen [facility] 2/9 06/16/2017 11/11/2016 03/17/2016  Decreased Interest 0 0 0  Down, Depressed, Hopeless 0 0 0  PHQ - 2 Score 0 0 0   GAD 7 : Generalized Anxiety Score 06/19/2017  Nervous, Anxious, on Edge 0  Control/stop worrying 0  Worry too much - different things 1  Trouble relaxing 0  Restless 0  Easily annoyed or irritable 0  Afraid - awful might happen 0  Total GAD 7 Score 1  Anxiety Difficulty Not difficult at all     Social History  Substance Use Topics  . Smoking status: Never Smoker  . Smokeless tobacco: Never Used  . Alcohol use No    Review of Systems Per HPI unless specifically indicated above     Objective:    BP 132/79   Pulse 72   Temp 98 F (36.7 C) (Oral)   Resp 16   Ht 5\' 6"  (1.676 m)   Wt 159 lb (72.1 kg)   BMI 25.66 kg/m   Wt Readings from Last 3 Encounters:  06/19/17 159 lb (72.1 kg)  06/16/17 158 lb 9.6 oz (71.9 kg)  06/01/17 159 lb 3 oz (72.2 kg)    Physical Exam  Constitutional: He is oriented to person, place, and time. He appears well-developed  and well-nourished. No distress.  Well-appearing, comfortable, cooperative  HENT:  Head: Normocephalic and atraumatic.  Mouth/Throat: Oropharynx is clear and moist.  Eyes: Conjunctivae are normal. Right eye exhibits no discharge. Left eye exhibits no discharge.  Neck: Normal range of motion. Neck supple. No thyromegaly present.  Cardiovascular: Normal rate, regular rhythm, normal heart sounds and intact distal pulses.   No murmur heard. Pulmonary/Chest: Effort normal and breath sounds normal. No respiratory distress. He  has no wheezes. He has no rales.  Musculoskeletal: He exhibits no edema.  Neurological: He is alert and oriented to person, place, and time.  Skin: Skin is warm and dry. No rash noted. He is not diaphoretic. No erythema.  Psychiatric: He has a normal mood and affect. His behavior is normal.  Well groomed, good eye contact, normal speech and thoughts, does not appear anxious. No abnormal behaviors or extra sensory perceptions  Nursing note and vitals reviewed.     Assessment & Plan:   Problem List Items Addressed This Visit    Hypothyroidism    Stable, chronic problem (since 2003) s/p radiation for lymphoma - Last TSH 2.74 (05/2017)  Plan: 1. Continue current dose Levothyroxine 69mcg daily for now, check yearly or sooner if indicated      Hypnopompic hallucination    Clinically consistent with normal hypnopompic hallucinations with waking up overnight or partially asleep. No other described extrasensory perceptions or hallucinations. - Reassurance given - May switch Sertraline from PM to AM dosing      Hypertension - Primary    Mildly elevated initial BP, repeat manual check improved. - Home BP readings none  Complication with CKD-III   Plan:  1. Continue current BP regimen Losartan 50mg  daily, Spironolasctone 25mg , Amlodapine 5mg , Acetabutolol 200mg  2. Encourage improved lifestyle - low sodium diet, regular exercise 3. Start monitor BP outside office, bring readings to next visit, if persistently >140/90 or new symptoms notify office sooner 4. Follow-up 6 months      Hyperlipidemia    Mostly well controlled cholesterol on statin and limited lifestyle Last lipid panel 05/2017 ASCVD 10 yr risk score elevated  Plan: 1. Continue current meds - Simvastatin 40mg  2. May consider continue ASA 81mg  for primary ASCVD risk reduction 3. Encourage improved lifestyle - low carb/cholesterol, reduce portion size, continue improving regular exercise      Anxiety    Stable chronic mild  anxiety, does not seem consistent with GAD or panic - No prior Psych / counseling  Plan: 1. Continue Sertraline 25mg  daily - switch from PM dosing to AM, now seems improved on daily dose instead of PRN 2. Advised to not use BDZ - has Diazepam on hand old rx saving for extreme circumstances      Relevant Medications   sertraline (ZOLOFT) 25 MG tablet    Other Visit Diagnoses    Abnormal glucose       A1c 5.7, no comparison, had prior non fasting [id number] Reassurance will re-check A1c in 6 months with other lab      Meds ordered this encounter  Medications  . sertraline (ZOLOFT) 25 MG tablet    Sig: Take 1 tablet (25 mg total) by mouth daily.    Dispense:  30 tablet    Refill:  11    Follow up plan: Return in about 6 months (around 12/20/2017) for Pre-Diabetes, Hypothyroid.  Nobie Putnam, [facility] 06/19/2017, 10:39 PM

## 2017-06-19 NOTE — Assessment & Plan Note (Signed)
Clinically consistent with normal hypnopompic hallucinations with waking up overnight or partially asleep. No other described extrasensory perceptions or hallucinations. - Reassurance given - May switch Sertraline from PM to AM dosing

## 2017-06-19 NOTE — Assessment & Plan Note (Signed)
Stable chronic mild anxiety, does not seem consistent with GAD or panic - No prior Psych / counseling  Plan: 1. Continue Sertraline 25mg  daily - switch from PM dosing to AM, now seems improved on daily dose instead of PRN 2. Advised to not use BDZ - has Diazepam on hand old rx saving for extreme circumstances

## 2017-06-19 NOTE — Assessment & Plan Note (Signed)
Mildly elevated initial BP, repeat manual check improved. - Home BP readings none  Complication with CKD-III   Plan:  1. Continue current BP regimen Losartan 50mg  daily, Spironolasctone 25mg , Amlodapine 5mg , Acetabutolol 200mg  2. Encourage improved lifestyle - low sodium diet, regular exercise 3. Start monitor BP outside office, bring readings to next visit, if persistently >140/90 or new symptoms notify office sooner 4. Follow-up 6 months

## 2017-06-19 NOTE — Assessment & Plan Note (Signed)
Stable, chronic problem (since 2003) s/p radiation for lymphoma - Last TSH 2.74 (05/2017)  Plan: 1. Continue current dose Levothyroxine 21mcg daily for now, check yearly or sooner if indicated

## 2017-07-06 ENCOUNTER — Other Ambulatory Visit: Payer: Self-pay

## 2017-07-06 DIAGNOSIS — F419 Anxiety disorder, unspecified: Secondary | ICD-10-CM

## 2017-07-17 ENCOUNTER — Other Ambulatory Visit: Payer: Self-pay

## 2017-07-17 DIAGNOSIS — I10 Essential (primary) hypertension: Secondary | ICD-10-CM

## 2017-07-17 MED ORDER — LOSARTAN POTASSIUM 50 MG PO TABS: ORAL_TABLET | ORAL | 11 refills | Status: DC

## 2017-07-22 DIAGNOSIS — L570 Actinic keratosis: Secondary | ICD-10-CM | POA: Diagnosis not present

## 2017-07-22 DIAGNOSIS — Z85828 Personal history of other malignant neoplasm of skin: Secondary | ICD-10-CM | POA: Diagnosis not present

## 2017-07-22 DIAGNOSIS — X32XXXA Exposure to sunlight, initial encounter: Secondary | ICD-10-CM | POA: Diagnosis not present

## 2017-07-28 ENCOUNTER — Encounter: Payer: Self-pay | Admitting: Family Medicine

## 2017-08-05 ENCOUNTER — Other Ambulatory Visit: Payer: Self-pay

## 2017-08-05 DIAGNOSIS — E785 Hyperlipidemia, unspecified: Secondary | ICD-10-CM

## 2017-08-05 DIAGNOSIS — I10 Essential (primary) hypertension: Secondary | ICD-10-CM

## 2017-08-05 MED ORDER — SIMVASTATIN 40 MG PO TABS: 40.0000 mg | ORAL_TABLET | Freq: Every day | ORAL | 11 refills | Status: DC

## 2017-08-05 MED ORDER — ACEBUTOLOL HCL 200 MG PO CAPS: ORAL_CAPSULE | ORAL | 11 refills | Status: DC

## 2017-08-08 ENCOUNTER — Other Ambulatory Visit: Payer: Self-pay | Admitting: Family Medicine

## 2017-08-08 DIAGNOSIS — N529 Male erectile dysfunction, unspecified: Secondary | ICD-10-CM

## 2017-08-21 ENCOUNTER — Other Ambulatory Visit: Payer: Self-pay

## 2017-08-21 DIAGNOSIS — I10 Essential (primary) hypertension: Secondary | ICD-10-CM

## 2017-08-21 DIAGNOSIS — R600 Localized edema: Secondary | ICD-10-CM

## 2017-08-21 MED ORDER — SPIRONOLACTONE 25 MG PO TABS: ORAL_TABLET | ORAL | 3 refills | Status: DC

## 2017-08-26 DIAGNOSIS — H1013 Acute atopic conjunctivitis, bilateral: Secondary | ICD-10-CM | POA: Diagnosis not present

## 2017-09-10 ENCOUNTER — Other Ambulatory Visit: Payer: Self-pay

## 2017-09-14 ENCOUNTER — Other Ambulatory Visit: Payer: Self-pay

## 2017-09-14 DIAGNOSIS — I10 Essential (primary) hypertension: Secondary | ICD-10-CM

## 2017-09-14 MED ORDER — AMLODIPINE BESYLATE 5 MG PO TABS: ORAL_TABLET | ORAL | 3 refills | Status: DC

## 2017-09-23 DIAGNOSIS — H401132 Primary open-angle glaucoma, bilateral, moderate stage: Secondary | ICD-10-CM | POA: Diagnosis not present

## 2017-10-08 ENCOUNTER — Other Ambulatory Visit: Payer: Self-pay

## 2017-10-08 DIAGNOSIS — E034 Atrophy of thyroid (acquired): Secondary | ICD-10-CM

## 2017-10-08 MED ORDER — LEVOTHYROXINE SODIUM 50 MCG PO TABS: 50.0000 ug | ORAL_TABLET | Freq: Every day | ORAL | 6 refills | Status: DC

## 2017-10-23 ENCOUNTER — Ambulatory Visit (INDEPENDENT_AMBULATORY_CARE_PROVIDER_SITE_OTHER): Payer: Medicare Other | Admitting: Podiatry

## 2017-10-23 ENCOUNTER — Encounter: Payer: Self-pay | Admitting: Podiatry

## 2017-10-23 DIAGNOSIS — B351 Tinea unguium: Secondary | ICD-10-CM | POA: Diagnosis not present

## 2017-10-23 DIAGNOSIS — M79676 Pain in unspecified toe(s): Secondary | ICD-10-CM

## 2017-10-26 NOTE — Progress Notes (Signed)
   SUBJECTIVE Patient presents to office today complaining of elongated, thickened nails. Pain while ambulating in shoes. Patient is unable to trim their own nails.   Past Medical History:  Diagnosis Date  . Anxiety   . Depression   . Hypertension   . 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 Thyroid disease     OBJECTIVE General Patient is awake, alert, and oriented x 3 and in no acute distress. Derm Skin is dry and supple bilateral. Negative open lesions or macerations. Remaining integument unremarkable. Nails are tender, long, thickened and dystrophic with subungual debris, consistent with onychomycosis, 1-5 bilateral. No signs of infection noted. Vasc  DP and PT pedal pulses palpable bilaterally. Temperature gradient within normal limits.  Neuro Epicritic and protective threshold sensation diminished bilaterally.  Musculoskeletal Exam No symptomatic pedal deformities noted bilateral. Muscular strength within normal limits.  ASSESSMENT 1. Onychodystrophic nails 1-5 bilateral with hyperkeratosis of nails.  2. Onychomycosis of nail due to dermatophyte bilateral 3. Pain in foot bilateral  PLAN OF CARE 1. Patient evaluated today.  2. Instructed to maintain good pedal hygiene and foot care.  3. Mechanical debridement of nails 1-5 bilaterally performed using a nail nipper. Filed with dremel without incident.  4. Return to clinic in 3 mos.    Edrick Kins, DPM [facility]  Dr. Edrick Kins, [address]                                        [place], [place] [address]                Office [phone]  Fax [phone]

## 2017-12-15 ENCOUNTER — Other Ambulatory Visit: Payer: Medicare Other

## 2017-12-15 ENCOUNTER — Other Ambulatory Visit: Payer: Self-pay | Admitting: Family Medicine

## 2017-12-15 DIAGNOSIS — R7309 Other abnormal glucose: Secondary | ICD-10-CM

## 2017-12-15 DIAGNOSIS — N183 Chronic kidney disease, stage 3 unspecified: Secondary | ICD-10-CM

## 2017-12-15 DIAGNOSIS — E034 Atrophy of thyroid (acquired): Secondary | ICD-10-CM

## 2017-12-15 DIAGNOSIS — C8299 Follicular lymphoma, unspecified, extranodal and solid organ sites: Secondary | ICD-10-CM

## 2017-12-16 LAB — BASIC METABOLIC PANEL WITH GFR
BUN/Creatinine Ratio: 18 (calc) (ref 6–22)
BUN: 28 mg/dL — ABNORMAL HIGH (ref 7–25)
CO2: 25 mmol/L (ref 20–32)
Calcium: 9.6 mg/dL (ref 8.6–10.3)
Chloride: 103 mmol/L (ref 98–110)
Creat: 1.57 mg/dL — ABNORMAL HIGH (ref 0.70–1.11)
GFR, Est African American: 46 mL/min/{1.73_m2} — ABNORMAL LOW (ref >=60)
GFR, Est Non African American: 40 mL/min/{1.73_m2} — ABNORMAL LOW (ref >=60)
Glucose, Bld: 88 mg/dL (ref 65–99)
Potassium: 4.6 mmol/L (ref 3.5–5.3)
Sodium: 136 mmol/L (ref 135–146)

## 2017-12-16 LAB — HEMOGLOBIN A1C
Hgb A1c MFr Bld: 5.8 % of total Hgb — ABNORMAL HIGH (ref <5.7)
Mean Plasma Glucose: 120 (calc)
eAG (mmol/L): 6.6 (calc)

## 2017-12-16 LAB — TSH: TSH: 4.09 mIU/L (ref 0.40–4.50)

## 2017-12-17 ENCOUNTER — Encounter: Payer: Self-pay | Admitting: Family Medicine

## 2017-12-17 DIAGNOSIS — R7303 Prediabetes: Secondary | ICD-10-CM | POA: Insufficient documentation

## 2017-12-23 ENCOUNTER — Ambulatory Visit: Payer: Medicare Other | Admitting: Family Medicine

## 2017-12-25 ENCOUNTER — Encounter: Payer: Self-pay | Admitting: Family Medicine

## 2017-12-25 ENCOUNTER — Ambulatory Visit (INDEPENDENT_AMBULATORY_CARE_PROVIDER_SITE_OTHER): Payer: Medicare Other | Admitting: Family Medicine

## 2017-12-25 VITALS — BP 136/75 | HR 77 | Temp 98.3°F | Resp 16 | Ht 66.0 in | Wt 163.0 lb

## 2017-12-25 DIAGNOSIS — S80812A Abrasion, left lower leg, initial encounter: Secondary | ICD-10-CM

## 2017-12-25 DIAGNOSIS — L739 Follicular disorder, unspecified: Secondary | ICD-10-CM | POA: Diagnosis not present

## 2017-12-25 DIAGNOSIS — C8299 Follicular lymphoma, unspecified, extranodal and solid organ sites: Secondary | ICD-10-CM | POA: Diagnosis not present

## 2017-12-25 DIAGNOSIS — R7303 Prediabetes: Secondary | ICD-10-CM | POA: Diagnosis not present

## 2017-12-25 DIAGNOSIS — N183 Chronic kidney disease, stage 3 unspecified: Secondary | ICD-10-CM

## 2017-12-25 DIAGNOSIS — E034 Atrophy of thyroid (acquired): Secondary | ICD-10-CM

## 2017-12-25 MED ORDER — TRIAMCINOLONE ACETONIDE 0.5 % EX CREA: 1.0000 "application " | TOPICAL_CREAM | Freq: Two times a day (BID) | CUTANEOUS | 0 refills | Status: DC

## 2017-12-25 MED ORDER — AMOXICILLIN-POT CLAVULANATE 875-125 MG PO TABS: 1.0000 | ORAL_TABLET | Freq: Two times a day (BID) | ORAL | 0 refills | Status: DC

## 2017-12-25 NOTE — Assessment & Plan Note (Signed)
Gradual inc Cr but relatively stable, now 1.57 from 1.47, likely secondary to HTN, also possibly with lymphoma s/p chemotherapy Cr baseline 1.4 to 1.5  Plan: 1. Monitor Cr regularly, labs q 3-6 months - also followed by Heme/Onc [facility] 2. Remain off NSAIDs. Encouraged Tylenol use PRN pain 3. Improve hydration 4. Control BP 5. Follow-up as needed

## 2017-12-25 NOTE — Assessment & Plan Note (Signed)
Stable, chronic problem (since 2003) s/p radiation for lymphoma - Trend TSH 2.74 (05/2017) > 4.09 (11/2017)  Plan: 1. Continue current dose Levothyroxine 65mcg daily for now, check yearly or sooner if indicated

## 2017-12-25 NOTE — Assessment & Plan Note (Addendum)
Stable, without known recurrence Followed by [facility] yearly Last seen 05/2017 - next due 05/2018, prior labs unremarkable and stable Re-check labs recently, CBC was not drawn as requested in 11/2017, repeat lab draw today for CBC especially given concern with lower extremity and some petechial symptoms, will check plt - follow-up result will forward Heme/Onc

## 2017-12-25 NOTE — Patient Instructions (Addendum)
Thank you for coming to the office today.  1. I don't think this is a blood clot as discussed, it looks like scratches on leg that may be infected  Also you have some itching it could be related to other contact dermatitis or skin problem  Try topical Triamcinolone cream twice daily as needed for itching and irritation of skin  Take Augmentin twice daily for 10 days for possible skin infection  Checked blood count today - sorry about repeat lab - will notify you about blood counts as soon as it is available early next week  If worsening swelling in leg or worsening redness pain or other new changes then go to hospital ED for immediate evaluation  Call me next week to give an update on how leg is doing  Please schedule a Follow-up Appointment to: Return in about 3 months (around 03/24/2018) for 3-6 months for follow-up Pre-DM A1c, Thyroid.    If you have any other questions or concerns, please feel free to call the office or send a message through [facility]. You may also schedule an earlier appointment if necessary.  Additionally, you may be receiving a survey about your experience at our office within a few days to 1 week by e-mail or mail. We value your feedback.  Nobie Putnam, DO [place]

## 2017-12-25 NOTE — Assessment & Plan Note (Signed)
New diagnosis, prior elevated A1c - Well-controlled Pre-DM with A1c 5.8 up from 5.7  Plan:  1. Not on any therapy currently  2. Encourage improved lifestyle - low carb, low sugar diet, reduce portion size, continue improving regular exercise as tolerated 3. Follow-up q 6 months future POC A1c or can do lab draw if needed

## 2017-12-25 NOTE — Progress Notes (Signed)
Subjective:    Patient ID: Manuel Reynolds, male    DOB: 28-Oct-1930, 84 y.o.   MRN: [id number]  Manuel Reynolds is a 84 y.o. male presenting on 12/25/2017 for Rash (left leg)  Patient provides most of history, also accompanied by his wife, Claud Gowan, who provides portion of history as well.  HPI   CHRONIC HTN, complicated by CKD-III Reports no concerns today BP is normal. Prior had mild low Na, this was improved on re-check labs 11/2017. Also his Creatinine was slightly higher 1.57 prior 1.47 similar to prior CKD trend. Current Meds - Losartan 50mg  daily, Spironolasctone 25mg , Amlodapine 5mg , Acetabutolol 200mg  (due to history of tachycardia) Reports good compliance, took meds today. Tolerating well, w/o complaints. Denies CP, dyspnea, HA, edema, dizziness / lightheadedness  Pre-Diabetes / Elevated A1c Recent trend A1c 5.7 in past, now re-check at 5.8 with last labs 11/2017. No prior dx of Pre-DM or DM CBGs: None Meds: Never on med Currently on ARB Lifestyle: No specific dietary changes, eats balanced mostly Limited regular exercise Denies hypoglycemia, polyuria, visual changes, numbness or tingling.  Hypothyroidism - Chronic problem since 2003, secondary to radiation therapy for lymphoma, then only had problem with low thyroid for past 2-3 years. - Last lab TSH up from 2.7 to 4.09 - He does not endorse any new symptoms related to thyroid - Continues Levothyroxine 75mcg daily - Denies energy or fatigue change, hair changes, temperature instability ------------------------  Additional acute complaint today:  Left Lower Extremity Itching / Scratches / Possible Infection - Reports new complaint x 3-4 day onset with LLE had initial symptoms of itching, he started scratching it, and also around that time he had a scrape injury on upper outer knee / leg, he has felt itchy still and noticed some redness of leg, also some increased swelling. He has history of chronic pedal or lower extremity  edema in past. Now seems worse over few days - he was concerned because he has history of prior DVT LLE 2014, no further DVT or recurrence since that time - he has a dermatologist, Dr Kellie Moor at [facility], has been on some topicals before - Does not seem to be worsening, seems relatively stable - Denies calf pain, immobility of leg, trauma or injury, spreading redness, fever chills, drainage of pus  History of Lymphoma, NHL Followed by Dr Mike Gip [facility]) for yearly labs including CBC CMP, LDH, they are monitoring for any changes, he has been stable recently and now only following up yearly, last 05/2017, next is 05/2018. He has had normal platelets since. In past this was a problem with thrombocytopenia that they noticed a rash similar to this one that was at onset of his symptoms - he wanted to get it checked out   Depression screen [facility] 2/9 06/16/2017 11/11/2016 03/17/2016  Decreased Interest 0 0 0  Down, Depressed, Hopeless 0 0 0  PHQ - 2 Score 0 0 0    Social History   Tobacco Use  . Smoking status: Never Smoker  . Smokeless tobacco: Never Used  Substance Use Topics  . Alcohol use: No  . Drug use: No    Review of Systems Per HPI unless specifically indicated above     Objective:    BP 136/75   Pulse 77   Temp 98.3 F (36.8 C) (Oral)   Resp 16   Ht 5\' 6"  (1.676 m)   Wt 163 lb (73.9 kg)   BMI 26.31 kg/m   Wt Readings from  Last 3 Encounters:  12/25/17 163 lb (73.9 kg)  06/19/17 159 lb (72.1 kg)  06/16/17 158 lb 9.6 oz (71.9 kg)    Physical Exam  Constitutional: He is oriented to person, place, and time. He appears well-developed and well-nourished. No distress.  Well-appearing elderly 84 year old male, comfortable, cooperative  HENT:  Head: Normocephalic and atraumatic.  Mouth/Throat: Oropharynx is clear and moist.  Eyes: Conjunctivae are normal. Right eye exhibits no discharge. Left eye exhibits no discharge.  Cardiovascular: Normal rate, regular  rhythm, normal heart sounds and intact distal pulses.  No murmur heard. Pulmonary/Chest: Effort normal and breath sounds normal. No respiratory distress. He has no wheezes. He has no rales.  Musculoskeletal: Normal range of motion. He exhibits edema (Left lower ankle and lower leg +1 pitting edema, non tender).  Calf asymmetry mild L 35.5 cm compared to R calf 34  Neurological: He is alert and oriented to person, place, and time.  Skin: Skin is warm and dry. Rash (Left lower extremity has some petechial rash non blanching and some scattered areas of excoriation with scabs various stages of healing) noted. He is not diaphoretic. There is erythema (mild general erythema associated with these areas of left lower leg, significant extensive or extending erythema only seems localized).  Non tender  Some mild dry skin appearance  Psychiatric: He has a normal mood and affect. His behavior is normal.  Well groomed, good eye contact, normal speech and thoughts  Nursing note and vitals reviewed.           Results for orders placed or performed in visit on 12/15/17  Hemoglobin A1c  Result Value Ref Range   Hgb A1c MFr Bld 5.8 (H) <5.7 % of total Hgb   Mean Plasma Glucose 120 (calc)   eAG (mmol/L) 6.6 (calc)  TSH  Result Value Ref Range   TSH 4.09 0.40 - 4.50 mIU/L  BASIC METABOLIC PANEL WITH GFR  Result Value Ref Range   Glucose, Bld 88 65 - 99 mg/dL   BUN 28 (H) 7 - 25 mg/dL   Creat 1.57 (H) 0.70 - 1.11 mg/dL   GFR, Est Non African American 40 (L) > OR = 60 mL/min/1.24m2   GFR, Est African American 46 (L) > OR = 60 mL/min/1.67m2   BUN/Creatinine Ratio 18 6 - 22 (calc)   Sodium 136 135 - 146 mmol/L   Potassium 4.6 3.5 - 5.3 mmol/L   Chloride 103 98 - 110 mmol/L   CO2 25 20 - 32 mmol/L   Calcium 9.6 8.6 - 10.3 mg/dL      Assessment & Plan:   Problem List Items Addressed This Visit    CKD (chronic kidney disease), stage III (HCC)    Gradual inc Cr but relatively stable, now 1.57  from 1.47, likely secondary to HTN, also possibly with lymphoma s/p chemotherapy Cr baseline 1.4 to 1.5  Plan: 1. Monitor Cr regularly, labs q 3-6 months - also followed by Heme/Onc [facility] 2. Remain off NSAIDs. Encouraged Tylenol use PRN pain 3. Improve hydration 4. Control BP 5. Follow-up as needed      Hypothyroidism - Primary    Stable, chronic problem (since 2003) s/p radiation for lymphoma - Trend TSH 2.74 (05/2017) > 4.09 (11/2017)  Plan: 1. Continue current dose Levothyroxine 54mcg daily for now, check yearly or sooner if indicated      Lymphoma malignant, nodular, lymphocytic ([place])    Stable, without known recurrence Followed by [facility] yearly Last  seen 05/2017 - next due 05/2018, prior labs unremarkable and stable Re-check labs recently, CBC was not drawn as requested in 11/2017, repeat lab draw today for CBC especially given concern with lower extremity and some petechial symptoms, will check plt - follow-up result will forward Heme/Onc      Relevant Medications   amoxicillin-clavulanate (AUGMENTIN) 875-125 MG tablet   Other Relevant Orders   CBC with Differential/Platelet   Pre-diabetes    New diagnosis, prior elevated A1c - Well-controlled Pre-DM with A1c 5.8 up from 5.7  Plan:  1. Not on any therapy currently  2. Encourage improved lifestyle - low carb, low sugar diet, reduce portion size, continue improving regular exercise as tolerated 3. Follow-up q 6 months future POC A1c or can do lab draw if needed       Other Visit Diagnoses    Folliculitis     Concern for superficial skin infection may be secondary to scratching Not consistent with cellulitis at this time, but may be early Trial on oral antibiotics Augmentin BID x 10 days    Relevant Medications   amoxicillin-clavulanate (AUGMENTIN) 875-125 MG tablet   Excoriation of left lower leg, initial encounter Suspect some chronic atopic derm among other etiology maybe contact dermatitis for  initial itching response Trial on topical steroid as advised Follow-up with Dermatology if not improved      Relevant Medications   triamcinolone cream (KENALOG) 0.5 %      # Left leg swelling Considered DVT as discussed, seems less likely given clinical appearance and history, cannot rule this out due to his history of lymphoma, prior DVT, Well's Score is elevated 2-3 higher risk. Given atypical symptoms itching excoriation will treat for infection first, and then prompt follow-up or close follow-up if not improved within next few days to week, may return or go to hospital, would likely need Venous Doppler US for left lower leg to rule out DVT if not improved - Try compression, elevation as well for symptom relief swelling   Meds ordered this encounter  Medications  . amoxicillin-clavulanate (AUGMENTIN) 875-125 MG tablet    Sig: Take 1 tablet by mouth 2 (two) times daily. For to 10 days    Dispense:  20 tablet    Refill:  0  . triamcinolone cream (KENALOG) 0.5 %    Sig: Apply 1 application topically 2 (two) times daily. To affected areas, for up to 2 weeks.    Dispense:  30 g    Refill:  0     Follow up plan: Return in about 3 months (around 03/24/2018) for 3-6 months for follow-up Pre-DM A1c, Thyroid.  Nobie Putnam, [facility] 12/25/2017, 1:37 PM

## 2017-12-26 LAB — CBC WITH DIFFERENTIAL/PLATELET
Basophils Absolute: 31 cells/uL (ref 0–200)
Basophils Relative: 0.3 %
Eosinophils Absolute: 229 cells/uL (ref 15–500)
Eosinophils Relative: 2.2 %
HCT: 40.8 % (ref 38.5–50.0)
Hemoglobin: 14.1 g/dL (ref 13.2–17.1)
Lymphs Abs: 4202 cells/uL — ABNORMAL HIGH (ref 850–3900)
MCH: 29.9 pg (ref 27.0–33.0)
MCHC: 34.6 g/dL (ref 32.0–36.0)
MCV: 86.4 fL (ref 80.0–100.0)
MPV: 12.4 fL (ref 7.5–12.5)
Monocytes Relative: 10.9 %
Neutro Abs: 4805 cells/uL (ref 1500–7800)
Neutrophils Relative %: 46.2 %
Platelets: 228 10*3/uL (ref 140–400)
RBC: 4.72 10*6/uL (ref 4.20–5.80)
RDW: 13.6 % (ref 11.0–15.0)
Total Lymphocyte: 40.4 %
WBC mixed population: 1134 cells/uL — ABNORMAL HIGH (ref 200–950)
WBC: 10.4 10*3/uL (ref 3.8–10.8)

## 2017-12-28 ENCOUNTER — Ambulatory Visit: Payer: Medicare Other | Admitting: Family Medicine

## 2017-12-28 ENCOUNTER — Telehealth: Payer: Self-pay | Admitting: Family Medicine

## 2017-12-28 NOTE — Telephone Encounter (Signed)
Last seen Friday 12/25/17. See note and pictures. I checked CBC, reviewed results, see result note.  Called patient, spoke with wife, [name]. he is improved. Itching has resolved, redness and swelling improved, and no worsening or new symptoms.  I have forwarded lab to Dr Nolon Stalls, and she requested an update. I sent her message copied below and gave her his recent clinical updates:  He had complaints of left lower leg itching and rash also involved with scratching hard to tell which came first, then some swelling and redness. Given his Oncology history and also history of DVT, these were considered, we checked CBC that seemed to be unremarkable. I covered him for atopic derm process with topical triamcinolone for itching and oral antibiotics for the redness concern for early folliculitis vs less likely cellulitis.  Nobie Putnam, [facility] 12/28/2017, 12:19 PM

## 2018-01-21 DIAGNOSIS — H401132 Primary open-angle glaucoma, bilateral, moderate stage: Secondary | ICD-10-CM | POA: Diagnosis not present

## 2018-04-28 DIAGNOSIS — D3615 Benign neoplasm of peripheral nerves and autonomic nervous system of abdomen: Secondary | ICD-10-CM | POA: Diagnosis not present

## 2018-04-28 DIAGNOSIS — Z08 Encounter for follow-up examination after completed treatment for malignant neoplasm: Secondary | ICD-10-CM | POA: Diagnosis not present

## 2018-04-28 DIAGNOSIS — Z85828 Personal history of other malignant neoplasm of skin: Secondary | ICD-10-CM | POA: Diagnosis not present

## 2018-04-28 DIAGNOSIS — L218 Other seborrheic dermatitis: Secondary | ICD-10-CM | POA: Diagnosis not present

## 2018-05-19 ENCOUNTER — Other Ambulatory Visit: Payer: Self-pay | Admitting: Family Medicine

## 2018-05-19 DIAGNOSIS — E034 Atrophy of thyroid (acquired): Secondary | ICD-10-CM

## 2018-06-02 NOTE — Progress Notes (Signed)
[facility] day:  06/03/18  Chief Complaint: Manuel Reynolds is a 84 y.o. male with small cleaved nodular lymphocytic non-Hodgkin's lymphoma who is seen for yearly assessment.  HPI:  The patient was last seen in the medical oncology clinic on 06/01/2017.  At that time, patient was feeling "fine".  He denied any acute concerns.  Patient had Efudex related skin irritation to his bilateral cheeks.  He was being followed by dermatology. Exam revealed no adenopathy or hepatosplenomegaly.  Sodium was low at 132.  Routine labs performed by PCP on 12/25/2017 that demonstrated a WBC of 10.4 ([place] 4805, ALC 4202. Hemoglobin 14.1, hematocrit 40.8, MCV 86.4, and platelets 228,000.   In the interim, patient has been doing well. He cannot verbalize any recollection of significant issues that have occurred over the last year. He notes that he is "feeling fine". Patient denies bleeding; no hematochezia, melena, or gross hematuria.   He has not appreciated any new areas of palpable adenopathy. Patient denies that he has experienced any B symptoms. He denies any interval infections. He continues to have chronic swelling in his lower extremities. Previous skin issues to patient's face resolved with the use of the Efudex cream. He continues to be followed by dermatology.   Patient advises that he maintains an adequate appetite. He is eating well. Weight today is 163 lb 8 oz (74.2 kg), which compared to his last visit to the clinic, represents a 4 pound increase.   Patient denies pain in the clinic today.  Past Medical History:  Diagnosis Date  . Anxiety   . Depression   . Hypertension   . Lymphoma malignant, nodular, lymphocytic ([place]) 05/25/2015  . Thyroid disease     Past Surgical History:  Procedure Laterality Date  . EYE SURGERY  2010   cataract    Family History  Problem Relation Age of Onset  . Hypertension Father   . Hypertension Mother   . Lung cancer  Sister   . Diabetes Maternal Aunt   . Diabetes Maternal Uncle   . Diabetes Maternal Aunt   . Diabetes Maternal Uncle     Social History:  reports that he has never smoked. He has never used smokeless tobacco. He reports that he does not drink alcohol or use drugs.  The patient is accompanied by his wife,  [name], today.  Allergies:  Allergies  Allergen Reactions  . Sulfa Antibiotics Rash    Current Medications: Current Outpatient Medications  Medication Sig Dispense Refill  . acebutolol (SECTRAL) 200 MG capsule TAKE 1 CAPSULE(200 MG) BY MOUTH DAILY 30 capsule 11  . amLODipine (NORVASC) 5 MG tablet TAKE 1 TABLET(5 MG) BY MOUTH DAILY 90 tablet 3  . amoxicillin-clavulanate (AUGMENTIN) 875-125 MG tablet Take 1 tablet by mouth 2 (two) times daily. For to 10 days 20 tablet 0  . baclofen (LIORESAL) 10 MG tablet TAKE 1/2 TABLET(5 MG) BY MOUTH TWICE DAILY 30 tablet 0  . COMBIGAN 0.2-0.5 % ophthalmic solution INT 1 GTT IN OU BID  5  . diazepam (VALIUM) 2 MG tablet Take 1 tablet (2 mg total) by mouth as needed. 15 tablet 0  . ketoconazole (NIZORAL) 2 % shampoo   6  . latanoprost (XALATAN) 0.005 % ophthalmic solution 1 drop at bedtime.    Marland Kitchen levothyroxine (SYNTHROID, LEVOTHROID) 50 MCG tablet TAKE 1 TABLET(50 MCG) BY MOUTH DAILY BEFORE BREAKFAST 30 tablet 5  . losartan (COZAAR) 50 MG tablet TAKE 1 TABLET(50 MG) BY MOUTH  DAILY 30 tablet 11  . sertraline (ZOLOFT) 25 MG tablet Take 1 tablet (25 mg total) by mouth daily. 30 tablet 11  . simvastatin (ZOCOR) 40 MG tablet Take 1 tablet (40 mg total) by mouth daily. 30 tablet 11  . spironolactone (ALDACTONE) 25 MG tablet TAKE 1 TABLET(25 MG) BY MOUTH DAILY 90 tablet 3  . triamcinolone cream (KENALOG) 0.5 % Apply 1 application topically 2 (two) times daily. To affected areas, for up to 2 weeks. 30 g 0  . VIAGRA 100 MG tablet TAKE 1/2 TABLET BY MOUTH AS NEEDED FOR ERECTILE DYSFUNCTION 6 tablet 2   No current facility-administered medications for this visit.      Review of Systems  Constitutional: Negative for diaphoresis, fever, malaise/fatigue and weight loss (weight up 4 pounds).       "I am feeling good. I dont have any problems, doc".   HENT: Negative.   Eyes: Negative.   Respiratory: Negative for cough, hemoptysis, sputum production and shortness of breath.   Cardiovascular: Positive for leg swelling (Chronic). Negative for chest pain, palpitations, orthopnea and PND.  Gastrointestinal: Negative for abdominal pain, blood in stool, constipation, diarrhea, melena, nausea and vomiting.  Genitourinary: Negative for dysuria, frequency, hematuria and urgency.  Musculoskeletal: Negative for back pain, falls, joint pain and myalgias.  Skin: Negative for itching and rash.  Neurological: Negative for dizziness, tremors, weakness and headaches.  Endo/Heme/Allergies: Does not bruise/bleed easily.  Psychiatric/Behavioral: Negative for depression, memory loss and suicidal ideas. The patient is not nervous/anxious and does not have insomnia.   All other systems reviewed and are negative.  Performance status (ECOG): 0 - Asymptomatic   Vital signs BP 139/77 (BP Location: Left Arm, Patient Position: Sitting)   Pulse 91   Resp 18   Wt 163 lb 8 oz (74.2 kg)   BMI 26.39 kg/m   Physical Exam  Constitutional: He is oriented to person, place, and time and well-developed, well-nourished, and in no distress.  HENT:  Head: Normocephalic and atraumatic.  Teauna Dubach hair  Eyes: Pupils are equal, round, and reactive to light. EOM are normal. No scleral icterus.  Glasses.  Blue eyes.  Neck: Normal range of motion. Neck supple. No tracheal deviation present. No thyromegaly present.  Cardiovascular: Normal rate, regular rhythm and normal heart sounds. Exam reveals no gallop and no friction rub.  No murmur heard. Pulmonary/Chest: Effort normal and breath sounds normal. No respiratory distress. He has no wheezes. He has no rales.  Abdominal: Soft. Bowel sounds are  normal. He exhibits no distension. There is no tenderness.  Musculoskeletal: Normal range of motion. He exhibits edema (Chronic 1+ edema to bilateral lower extremities). He exhibits no tenderness.  Lymphadenopathy:    He has no cervical adenopathy.    He has no axillary adenopathy.       Right: No inguinal and no supraclavicular adenopathy present.       Left: No inguinal and no supraclavicular adenopathy present.  Neurological: He is alert and oriented to person, place, and time.  Skin: Skin is warm and dry. No rash noted. No erythema.  Psychiatric: Mood, affect and judgment normal.  Nursing note and vitals reviewed.   Orders Only on 06/03/2018  Component Date Value Ref Range Status  . Uric Acid, Serum 06/03/2018 6.0  3.7 - 8.6 mg/dL Final   Comment: Please note change in reference range. Performed at [facility], [address], [place], [place] [address]   . LDH 06/03/2018 181  98 - 192 U/L  Final   Performed at [facility], [address], [place], [place] [address]  . Sodium 06/03/2018 137  135 - 145 mmol/L Final  . Potassium 06/03/2018 3.9  3.5 - 5.1 mmol/L Final  . Chloride 06/03/2018 108  98 - 111 mmol/L Final   Please note change in reference range.  . CO2 06/03/2018 23  22 - 32 mmol/L Final  . Glucose, Bld 06/03/2018 101* 70 - 99 mg/dL Final   Please note change in reference range.  . BUN 06/03/2018 31* 8 - 23 mg/dL Final   Please note change in reference range.  . Creatinine, Ser 06/03/2018 1.32* 0.61 - 1.24 mg/dL Final  . Calcium 06/03/2018 8.8* 8.9 - 10.3 mg/dL Final  . Total Protein 06/03/2018 6.8  6.5 - 8.1 g/dL Final  . Albumin 06/03/2018 4.0  3.5 - 5.0 g/dL Final  . AST 06/03/2018 23  15 - 41 U/L Final  . ALT 06/03/2018 16  0 - 44 U/L Final   Please note change in reference range.  . Alkaline Phosphatase 06/03/2018 67  38 - 126 U/L Final  . Total Bilirubin 06/03/2018 0.8  0.3 - 1.2 mg/dL Final  . GFR calc non Af Amer 06/03/2018 47* >60  mL/min Final  . GFR calc Af Amer 06/03/2018 55* >60 mL/min Final   Comment: (NOTE) The eGFR has been calculated using the CKD EPI equation. This calculation has not been validated in all clinical situations. eGFR's persistently <60 mL/min signify possible Chronic Kidney Disease.   Georgiann Hahn gap 06/03/2018 6  5 - 15 Final   Performed at [facility], [address] [place], [place] [address]  . WBC 06/03/2018 8.9  3.8 - 10.6 K/uL Final  . RBC 06/03/2018 4.57  4.40 - 5.90 MIL/uL Final  . Hemoglobin 06/03/2018 13.8  13.0 - 18.0 g/dL Final  . HCT 06/03/2018 39.7* 40.0 - 52.0 % Final  . MCV 06/03/2018 86.8  80.0 - 100.0 fL Final  . MCH 06/03/2018 30.2  26.0 - 34.0 pg Final  . MCHC 06/03/2018 34.8  32.0 - 36.0 g/dL Final  . RDW 06/03/2018 15.1* 11.5 - 14.5 % Final  . Platelets 06/03/2018 215  150 - 440 K/uL Final  . Neutrophils Relative % 06/03/2018 55  % Final  . Neutro Abs 06/03/2018 4.8  1.4 - 6.5 K/uL Final  . Lymphocytes Relative 06/03/2018 32  % Final  . Lymphs Abs 06/03/2018 2.8  1.0 - 3.6 K/uL Final  . Monocytes Relative 06/03/2018 12  % Final  . Monocytes Absolute 06/03/2018 1.1* 0.2 - 1.0 K/uL Final  . Eosinophils Relative 06/03/2018 1  % Final  . Eosinophils Absolute 06/03/2018 0.1  0 - 0.7 K/uL Final  . Basophils Relative 06/03/2018 0  % Final  . Basophils Absolute 06/03/2018 0.0  0 - 0.1 K/uL Final   Performed at [facility], [address] [place],  [address]    Assessment:  Wah Sabic is a 84 y.o. male with nodular lymphocytic non-Hodgkin's lymphoma diagnosed in 1998 with abdominal adenopathy.  He had B symptoms.  Original stage is unknown.  He was treated with CHOP followed by interferon x 1 year.  His disease recurred.  He was treated with fludarabine, Rituxan, and CNOP.  His disease recurred in 2003.  He was treated with Bexxar (tositumomab I-131 radioconjugate) in 2003 at [facility].  PET scan in 2005 was normal.  Symptomatically, patient is  doing well. He denies any acute symptoms. No new areas  of palpable adenopathy noted. Exam reveals no adenopathy or hepatosplenomegaly. WBC 8900 ([place] 4800). Creatinine 1.32 (CrCl 36.3 mL/min).   Plan: 1. Labs today:  CBC with diff, CMP, LDH, uric acid. 2.  Discuss plan for yearly follow-up unless any concerns; agrees to annual follow ups.  3.  Discuss renal function. Creatinine is 1.32. Encouraged to increase fluid intake.  4.  RTC in 1 year for MD assessment and labs (CBC with diff, CMP, LDH, uric acid).   Honor Loh, NP  06/03/2018, 4:19 PM

## 2018-06-03 ENCOUNTER — Other Ambulatory Visit: Payer: Self-pay

## 2018-06-03 ENCOUNTER — Inpatient Hospital Stay: Payer: Medicare Other | Attending: Hematology and Oncology

## 2018-06-03 ENCOUNTER — Inpatient Hospital Stay (HOSPITAL_BASED_OUTPATIENT_CLINIC_OR_DEPARTMENT_OTHER): Payer: Medicare Other | Admitting: Urgent Care

## 2018-06-03 VITALS — BP 139/77 | HR 91 | Resp 18 | Wt 163.5 lb

## 2018-06-03 DIAGNOSIS — C8200 Follicular lymphoma grade I, unspecified site: Secondary | ICD-10-CM

## 2018-06-03 DIAGNOSIS — C8299 Follicular lymphoma, unspecified, extranodal and solid organ sites: Secondary | ICD-10-CM

## 2018-06-03 DIAGNOSIS — I10 Essential (primary) hypertension: Secondary | ICD-10-CM | POA: Insufficient documentation

## 2018-06-03 DIAGNOSIS — Z9221 Personal history of antineoplastic chemotherapy: Secondary | ICD-10-CM | POA: Diagnosis not present

## 2018-06-03 DIAGNOSIS — Z8572 Personal history of non-Hodgkin lymphomas: Secondary | ICD-10-CM

## 2018-06-03 LAB — COMPREHENSIVE METABOLIC PANEL
ALT: 16 U/L (ref 0–44)
AST: 23 U/L (ref 15–41)
Albumin: 4 g/dL (ref 3.5–5.0)
Alkaline Phosphatase: 67 U/L (ref 38–126)
Anion gap: 6 (ref 5–15)
BUN: 31 mg/dL — ABNORMAL HIGH (ref 8–23)
CO2: 23 mmol/L (ref 22–32)
Calcium: 8.8 mg/dL — ABNORMAL LOW (ref 8.9–10.3)
Chloride: 108 mmol/L (ref 98–111)
Creatinine, Ser: 1.32 mg/dL — ABNORMAL HIGH (ref 0.61–1.24)
GFR calc Af Amer: 55 mL/min — ABNORMAL LOW (ref >60)
GFR calc non Af Amer: 47 mL/min — ABNORMAL LOW (ref >60)
Glucose, Bld: 101 mg/dL — ABNORMAL HIGH (ref 70–99)
Potassium: 3.9 mmol/L (ref 3.5–5.1)
Sodium: 137 mmol/L (ref 135–145)
Total Bilirubin: 0.8 mg/dL (ref 0.3–1.2)
Total Protein: 6.8 g/dL (ref 6.5–8.1)

## 2018-06-03 LAB — LACTATE DEHYDROGENASE: LDH: 181 U/L (ref 98–192)

## 2018-06-03 LAB — URIC ACID: Uric Acid, Serum: 6 mg/dL (ref 3.7–8.6)

## 2018-06-03 NOTE — Progress Notes (Signed)
Patient offers no complaints. 

## 2018-06-04 LAB — CBC WITH DIFFERENTIAL/PLATELET
Basophils Absolute: 0 10*3/uL (ref 0–0.1)
Basophils Relative: 0 %
Eosinophils Absolute: 0.1 10*3/uL (ref 0–0.7)
Eosinophils Relative: 1 %
HCT: 39.7 % — ABNORMAL LOW (ref 40.0–52.0)
Hemoglobin: 13.8 g/dL (ref 13.0–18.0)
Lymphocytes Relative: 32 %
Lymphs Abs: 2.8 10*3/uL (ref 1.0–3.6)
MCH: 30.2 pg (ref 26.0–34.0)
MCHC: 34.8 g/dL (ref 32.0–36.0)
MCV: 86.8 fL (ref 80.0–100.0)
Monocytes Absolute: 1.1 10*3/uL — ABNORMAL HIGH (ref 0.2–1.0)
Monocytes Relative: 12 %
Neutro Abs: 4.8 10*3/uL (ref 1.4–6.5)
Neutrophils Relative %: 55 %
Platelets: 215 10*3/uL (ref 150–440)
RBC: 4.57 MIL/uL (ref 4.40–5.90)
RDW: 15.1 % — ABNORMAL HIGH (ref 11.5–14.5)
WBC: 8.9 10*3/uL (ref 3.8–10.6)

## 2018-06-29 ENCOUNTER — Encounter: Payer: Self-pay | Admitting: Podiatry

## 2018-06-29 ENCOUNTER — Ambulatory Visit (INDEPENDENT_AMBULATORY_CARE_PROVIDER_SITE_OTHER): Payer: Medicare Other | Admitting: Podiatry

## 2018-06-29 DIAGNOSIS — B351 Tinea unguium: Secondary | ICD-10-CM | POA: Diagnosis not present

## 2018-06-29 DIAGNOSIS — M79676 Pain in unspecified toe(s): Secondary | ICD-10-CM | POA: Diagnosis not present

## 2018-07-02 NOTE — Progress Notes (Signed)
   SUBJECTIVE Patient presents to office today complaining of elongated, thickened nails that cause pain while ambulating in shoes. He is unable to trim his own nails. Patient is here for further evaluation and treatment.  Past Medical History:  Diagnosis Date  . Anxiety   . Depression   . Hypertension   . Lymphoma malignant, nodular, lymphocytic ([place]) 05/25/2015  . Thyroid disease     OBJECTIVE General Patient is awake, alert, and oriented x 3 and in no acute distress. Derm Skin is dry and supple bilateral. Negative open lesions or macerations. Remaining integument unremarkable. Nails are tender, long, thickened and dystrophic with subungual debris, consistent with onychomycosis, 1-5 bilateral. No signs of infection noted. Vasc  DP and PT pedal pulses palpable bilaterally. Temperature gradient within normal limits.  Neuro Epicritic and protective threshold sensation grossly intact bilaterally.  Musculoskeletal Exam No symptomatic pedal deformities noted bilateral. Muscular strength within normal limits.  ASSESSMENT 1. Onychodystrophic nails 1-5 bilateral with hyperkeratosis of nails.  2. Onychomycosis of nail due to dermatophyte bilateral 3. Pain in foot bilateral  PLAN OF CARE 1. Patient evaluated today.  2. Instructed to maintain good pedal hygiene and foot care.  3. Mechanical debridement of nails 1-5 bilaterally performed using a nail nipper. Filed with dremel without incident.  4. Return to clinic in 3 mos.    Edrick Kins, DPM [facility]  Dr. Edrick Kins, [address]                                        [place], [place] [address]                Office [phone]  Fax [phone]

## 2018-07-05 ENCOUNTER — Ambulatory Visit: Payer: Medicare Other | Admitting: Family Medicine

## 2018-07-13 ENCOUNTER — Ambulatory Visit
Admission: RE | Admit: 2018-07-13 | Discharge: 2018-07-13 | Disposition: A | Discharge status: Home / Self Care | Payer: Medicare Other | Source: Ambulatory Visit | Attending: Family Medicine | Admitting: Family Medicine

## 2018-07-13 ENCOUNTER — Ambulatory Visit (INDEPENDENT_AMBULATORY_CARE_PROVIDER_SITE_OTHER): Payer: Medicare Other

## 2018-07-13 ENCOUNTER — Other Ambulatory Visit: Payer: Self-pay | Admitting: Family Medicine

## 2018-07-13 ENCOUNTER — Ambulatory Visit (INDEPENDENT_AMBULATORY_CARE_PROVIDER_SITE_OTHER): Payer: Medicare Other | Admitting: Family Medicine

## 2018-07-13 ENCOUNTER — Encounter: Payer: Self-pay | Admitting: Family Medicine

## 2018-07-13 VITALS — BP 132/78 | HR 62 | Temp 97.8°F | Resp 17 | Ht 66.0 in | Wt 158.8 lb

## 2018-07-13 DIAGNOSIS — E034 Atrophy of thyroid (acquired): Secondary | ICD-10-CM | POA: Diagnosis not present

## 2018-07-13 DIAGNOSIS — R7303 Prediabetes: Secondary | ICD-10-CM | POA: Diagnosis not present

## 2018-07-13 DIAGNOSIS — M25532 Pain in left wrist: Principal | ICD-10-CM

## 2018-07-13 DIAGNOSIS — S6992XA Unspecified injury of left wrist, hand and finger(s), initial encounter: Secondary | ICD-10-CM | POA: Diagnosis not present

## 2018-07-13 DIAGNOSIS — E782 Mixed hyperlipidemia: Secondary | ICD-10-CM

## 2018-07-13 DIAGNOSIS — M25432 Effusion, left wrist: Secondary | ICD-10-CM

## 2018-07-13 DIAGNOSIS — N5201 Erectile dysfunction due to arterial insufficiency: Secondary | ICD-10-CM

## 2018-07-13 DIAGNOSIS — Z0000 Encounter for general adult medical examination without abnormal findings: Secondary | ICD-10-CM

## 2018-07-13 DIAGNOSIS — I10 Essential (primary) hypertension: Secondary | ICD-10-CM

## 2018-07-13 DIAGNOSIS — N183 Chronic kidney disease, stage 3 unspecified: Secondary | ICD-10-CM

## 2018-07-13 DIAGNOSIS — C8200 Follicular lymphoma grade I, unspecified site: Secondary | ICD-10-CM

## 2018-07-13 DIAGNOSIS — F419 Anxiety disorder, unspecified: Secondary | ICD-10-CM

## 2018-07-13 DIAGNOSIS — R600 Localized edema: Secondary | ICD-10-CM

## 2018-07-13 MED ORDER — AMLODIPINE BESYLATE 5 MG PO TABS: ORAL_TABLET | ORAL | 3 refills | Status: DC

## 2018-07-13 MED ORDER — SPIRONOLACTONE 25 MG PO TABS: ORAL_TABLET | ORAL | 3 refills | Status: DC

## 2018-07-13 MED ORDER — LEVOTHYROXINE SODIUM 50 MCG PO TABS: 50.0000 ug | ORAL_TABLET | Freq: Every day | ORAL | 3 refills | Status: DC

## 2018-07-13 MED ORDER — LOSARTAN POTASSIUM 50 MG PO TABS: ORAL_TABLET | ORAL | 3 refills | Status: DC

## 2018-07-13 MED ORDER — SIMVASTATIN 40 MG PO TABS: 40.0000 mg | ORAL_TABLET | Freq: Every day | ORAL | 3 refills | Status: DC

## 2018-07-13 NOTE — Progress Notes (Signed)
Subjective:    Patient ID: Manuel Reynolds, male    DOB: 06-07-1930, 84 y.o.   MRN: [id number]  Demian Maisel is a 84 y.o. male presenting on 07/13/2018 for Hypertension  Patient was seen by Tyler Aas LPN earlier today for Annual Medicare Wellness, he is accompanied by wife, Baraa Tubbs, who provides additional history.  HPI   Left Wrist New acute injury 2 days ago, accidental fall Sunday after he was in yard watching neighbor pull a log with a tractor and it swung over and hit his feet knocking him over, he fell forward on outstretched hand. He felt acute pain initially Left wrist, he took some advil and used ice packs, pain was worse next day with some swelling  CHRONIC HTN, complicated by CKD-III No new concerns. Recent lab 05/2018 showed improvement in reduced Cr Current Meds - Losartan 50mg  daily, Spironolasctone 25mg , Amlodapine 5mg , Acetabutolol 200mg  (due to history of tachycardia) Reports good compliance, took meds today. Tolerating well, w/o complaints. Denies CP, dyspnea, HA, edema, dizziness / lightheadedness  Pre-Diabetes / Elevated A1c Last lab 11/2017 showed A1c 5.8 slight inc trend. Pending lab draw A1c today now. CBGs:None Meds:Never on med Currently on ARB Lifestyle: No specific dietary changes, eats balanced mostly Limited regular exercise Denies hypoglycemia, polyuria, visual changes, numbness or tingling.  Hypothyroidism - Chronic problem since 2003, secondary to radiation therapy for lymphoma, then only had problem with low thyroid for past 2-3 years. - Last lab 11/2017, normal - He does not endorse any new symptoms related to thyroid - Continues Levothyroxine 69mcg daily - Denies energy or fatigue change, hair changes, temperature instability  Additional updates Stopped taking Sertraline while ago, he is only doing PRN now.  Health Maintenance: Due flu vaccine high dose, will return when in stock  Depression screen [facility] 2/9 07/13/2018 06/16/2017  11/11/2016  Decreased Interest 0 0 0  Down, Depressed, Hopeless 0 0 0  PHQ - 2 Score 0 0 0    Social History   Tobacco Use  . Smoking status: Never Smoker  . Smokeless tobacco: Never Used  Substance Use Topics  . Alcohol use: No  . Drug use: No    Review of Systems Per HPI unless specifically indicated above     Objective:    BP 132/78   Pulse 62   Temp 97.8 F (36.6 C) (Oral)   Resp 17   Ht 5\' 6"  (1.676 m)   Wt 158 lb 12.8 oz (72 kg)   BMI 25.63 kg/m   Wt Readings from Last 3 Encounters:  07/13/18 158 lb 12.8 oz (72 kg)  07/13/18 158 lb 12.8 oz (72 kg)  06/03/18 163 lb 8 oz (74.2 kg)    Physical Exam  Constitutional: He is oriented to person, place, and time. He appears well-developed and well-nourished. No distress.  Well-appearing, comfortable, cooperative  HENT:  Head: Normocephalic and atraumatic.  Mouth/Throat: Oropharynx is clear and moist.  Eyes: Conjunctivae are normal. Right eye exhibits no discharge. Left eye exhibits no discharge.  Neck: Normal range of motion. Neck supple. No thyromegaly present.  Cardiovascular: Normal rate, regular rhythm, normal heart sounds and intact distal pulses.  No murmur heard. Pulmonary/Chest: Effort normal and breath sounds normal. No respiratory distress. He has no wheezes. He has no rales.  Musculoskeletal:  Left Hand/Wrist Inspection: Mild edema and swelling over dorsal hand and wrist, symmetrical, no bulky MCP joints, no edema or erythema. Palpation: Non tender hand / wrist, carpal bones, including MCP, base of  thumb. No distinct anatomical snuff box or scaphoid tenderness.  ROM: reduced range of motion, limited by wrist extension due to pain Strength: cautious due to some pain but intact 5/5 grip, thumb opposition, wrist flex/ext Neurovascular: distally intact   Lymphadenopathy:    He has no cervical adenopathy.  Neurological: He is alert and oriented to person, place, and time.  Skin: Skin is warm and dry. No  rash noted. He is not diaphoretic. No erythema.  Psychiatric: He has a normal mood and affect. His behavior is normal.  Well groomed, good eye contact, normal speech and thoughts  Nursing note and vitals reviewed.    I have personally reviewed the radiology report from 07/13/18 STAT L Wrist X-ray.  CLINICAL DATA:  Pain after fall.  EXAM: LEFT WRIST - COMPLETE 3+ VIEW  COMPARISON:  No recent prior.  FINDINGS: No acute bony or joint abnormality identified. No evidence of acute fracture or dislocation. Corticated bony density noted adjacent to the ulnar styloid most consistent old fracture fragment. Degenerative change noted about the left wrist. Corticated lucency noted about the base of the left first metacarpal most likely degenerative or related to a simple cyst.  IMPRESSION: 1.  No acute abnormality.  Old ulnar styloid fracture.  2. Diffuse degenerative change. Subchondral cystic change in the first metacarpal most likely degenerative or related to a simple cyst.   Electronically Signed   By: [place]   On: 07/13/2018 13:01  Results for orders placed or performed in visit on 06/03/18  Uric acid  Result Value Ref Range   Uric Acid, Serum 6.0 3.7 - 8.6 mg/dL  Lactate dehydrogenase  Result Value Ref Range   LDH 181 98 - 192 U/L  Comprehensive metabolic panel  Result Value Ref Range   Sodium 137 135 - 145 mmol/L   Potassium 3.9 3.5 - 5.1 mmol/L   Chloride 108 98 - 111 mmol/L   CO2 23 22 - 32 mmol/L   Glucose, Bld 101 (H) 70 - 99 mg/dL   BUN 31 (H) 8 - 23 mg/dL   Creatinine, Ser 1.32 (H) 0.61 - 1.24 mg/dL   Calcium 8.8 (L) 8.9 - 10.3 mg/dL   Total Protein 6.8 6.5 - 8.1 g/dL   Albumin 4.0 3.5 - 5.0 g/dL   AST 23 15 - 41 U/L   ALT 16 0 - 44 U/L   Alkaline Phosphatase 67 38 - 126 U/L   Total Bilirubin 0.8 0.3 - 1.2 mg/dL   GFR calc non Af Amer 47 (L) >60 mL/min   GFR calc Af Amer 55 (L) >60 mL/min   Anion gap 6 5 - 15  CBC with  Differential/Platelet  Result Value Ref Range   WBC 8.9 3.8 - 10.6 K/uL   RBC 4.57 4.40 - 5.90 MIL/uL   Hemoglobin 13.8 13.0 - 18.0 g/dL   HCT 39.7 (L) 40.0 - 52.0 %   MCV 86.8 80.0 - 100.0 fL   MCH 30.2 26.0 - 34.0 pg   MCHC 34.8 32.0 - 36.0 g/dL   RDW 15.1 (H) 11.5 - 14.5 %   Platelets 215 150 - 440 K/uL   Neutrophils Relative % 55 %   Neutro Abs 4.8 1.4 - 6.5 K/uL   Lymphocytes Relative 32 %   Lymphs Abs 2.8 1.0 - 3.6 K/uL   Monocytes Relative 12 %   Monocytes Absolute 1.1 (H) 0.2 - 1.0 K/uL   Eosinophils Relative 1 %   Eosinophils Absolute 0.1 0 - 0.7 K/uL  Basophils Relative 0 %   Basophils Absolute 0.0 0 - 0.1 K/uL      Assessment & Plan:   Problem List Items Addressed This Visit    Hyperlipidemia    Mostly well controlled cholesterol on statin and limited lifestyle Last lipid panel 05/2017 ASCVD 10 yr risk score elevated  Plan: 1. Continue current meds - Simvastatin 40mg  - refill today 2. May consider continue ASA 81mg  for primary ASCVD risk reduction 3. Encourage improved lifestyle - low carb/cholesterol, reduce portion size, continue improving regular exercise  Next due for lipids in 6 months      Relevant Medications   spironolactone (ALDACTONE) 25 MG tablet   simvastatin (ZOCOR) 40 MG tablet   losartan (COZAAR) 50 MG tablet   amLODipine (NORVASC) 5 MG tablet   Hypertension    Mildly elevated initial BP, repeat manual check improved. - Home BP readings none  Complication with CKD-III   Plan:  1. Continue current BP regimen Losartan 50mg  daily, Spironolasctone 25mg , Amlodapine 5mg , Acetabutolol 200mg  2. Encourage improved lifestyle - low sodium diet, regular exercise 3. Continue monitor BP outside office, bring readings to next visit, if persistently >140/90 or new symptoms notify office sooner 4. Follow-up 6 months      Relevant Medications   spironolactone (ALDACTONE) 25 MG tablet   simvastatin (ZOCOR) 40 MG tablet   losartan (COZAAR) 50 MG tablet    amLODipine (NORVASC) 5 MG tablet   Hypothyroidism    Stable, chronic problem (since 2003) s/p radiation for lymphoma - Trend TSH 2.74 (05/2017) > 4.09 (11/2017)  Plan: 1. Continue current dose Levothyroxine 43mcg daily for now, - next testing interval 6 months for lab      Relevant Medications   levothyroxine (SYNTHROID, LEVOTHROID) 50 MCG tablet   Pedal edema   Relevant Medications   spironolactone (ALDACTONE) 25 MG tablet   Pre-diabetes    Well-controlled Pre-DM prior - now due for A1c today  Plan:  Check serum A1c today - f/u result 1. Not on any therapy currently  2. Encourage improved lifestyle - low carb, low sugar diet, reduce portion size, continue improving regular exercise as tolerated 3. Follow-up q 6 months lab      Relevant Orders   Hemoglobin A1c    Other Visit Diagnoses    Pain and swelling of left wrist    -  Primary   Relevant Orders   DG Wrist Complete Left (Completed)      Meds ordered this encounter  Medications  . spironolactone (ALDACTONE) 25 MG tablet    Sig: TAKE 1 TABLET(25 MG) BY MOUTH DAILY    Dispense:  90 tablet    Refill:  3  . simvastatin (ZOCOR) 40 MG tablet    Sig: Take 1 tablet (40 mg total) by mouth daily.    Dispense:  90 tablet    Refill:  3  . losartan (COZAAR) 50 MG tablet    Sig: TAKE 1 TABLET(50 MG) BY MOUTH DAILY    Dispense:  90 tablet    Refill:  3  . levothyroxine (SYNTHROID, LEVOTHROID) 50 MCG tablet    Sig: Take 1 tablet (50 mcg total) by mouth daily before breakfast.    Dispense:  90 tablet    Refill:  3  . amLODipine (NORVASC) 5 MG tablet    Sig: TAKE 1 TABLET(5 MG) BY MOUTH DAILY    Dispense:  90 tablet    Refill:  3    Follow up plan: Return in about  6 months (around 01/13/2019) for [facility].  Future labs ordered for 01/10/19  Nobie Putnam, [facility] 07/14/2018, 12:02 AM

## 2018-07-13 NOTE — Patient Instructions (Addendum)
Thank you for coming to the office today.  Keep up the good work overall  We will check A1c sugar test today - stay tuned for result - may need to reduce some sweets in diet.  Also checked X-ray left wrist - will call with results later today or tomorrow.  Please schedule and return for a NURSE ONLY VISIT for VACCINE - Approximately around October 2019 - Need High Dose Flu Vaccine  DUE for FASTING BLOOD WORK (no food or drink after midnight before the lab appointment, only water or coffee without cream/sugar on the morning of)  SCHEDULE "Lab Only" visit in the morning at the clinic for lab draw in 6 MONTHS   - Make sure Lab Only appointment is at about 1 week before your next appointment, so that results will be available  For Lab Results, once available within 2-3 days of blood draw, you can can log in to MyChart online to view your results and a brief explanation. Also, we can discuss results at next follow-up visit.   Please schedule a Follow-up Appointment to: Return in about 6 months (around 01/13/2019) for Yearly Medicare Checkup.  If you have any other questions or concerns, please feel free to call the office or send a message through [place]. You may also schedule an earlier appointment if necessary.  Additionally, you may be receiving a survey about your experience at our office within a few days to 1 week by e-mail or mail. We value your feedback.  Nobie Putnam, DO [place]

## 2018-07-13 NOTE — Assessment & Plan Note (Signed)
Mildly elevated initial BP, repeat manual check improved. - Home BP readings none  Complication with CKD-III   Plan:  1. Continue current BP regimen Losartan 50mg  daily, Spironolasctone 25mg , Amlodapine 5mg , Acetabutolol 200mg  2. Encourage improved lifestyle - low sodium diet, regular exercise 3. Continue monitor BP outside office, bring readings to next visit, if persistently >140/90 or new symptoms notify office sooner 4. Follow-up 6 months

## 2018-07-13 NOTE — Progress Notes (Signed)
Subjective:   Manuel Reynolds is a 84 y.o. male who presents for Medicare Annual/Subsequent preventive examination.  Review of Systems:  Cardiac Risk Factors include: advanced age (>43men, >30 women);hypertension;male gender;dyslipidemia     Objective:    Vitals: BP 132/78 (BP Location: Right Arm, Patient Position: Sitting)   Pulse 62   Temp 97.8 F (36.6 C) (Oral)   Resp 17   Ht 5\' 6"  (1.676 m)   Wt 158 lb 12.8 oz (72 kg)   BMI 25.63 kg/m   Body mass index is 25.63 kg/m.  Advanced Directives 07/13/2018 06/03/2018 06/16/2017 06/01/2017 05/26/2016 05/24/2015  Does Patient Have a Medical Advance Directive? Yes Yes Yes Yes Yes Yes  Type of Advance Directive Living will;Healthcare Power of [place];Living will - - -  Copy of [place] in Chart? No - copy requested - No - copy requested - - -    Tobacco Social History   Tobacco Use  Smoking Status Never Smoker  Smokeless Tobacco Never Used     Counseling given: Not Answered   Clinical Intake:  Pre-visit preparation completed: Yes  Pain : 0-10 Pain Score: 2  Pain Type: Acute pain Pain Location: Wrist Pain Orientation: Left Pain Descriptors / Indicators: Aching Pain Onset: In the past 7 days Pain Frequency: Intermittent     Nutritional Status: BMI 25 -29 Overweight Nutritional Risks: None Diabetes: No  How often do you need to have someone help you when you read instructions, pamphlets, or other written materials from your doctor or pharmacy?: 1 - Never What is the last grade level you completed in school?: masters degree   Interpreter Needed?: No  Information entered by :: Roshan Roback,LPN   Past Medical History:  Diagnosis Date  . Anxiety   . Depression   . Hypertension   . 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 Thyroid disease    Past Surgical History:  Procedure Laterality Date  . EYE SURGERY  2010   cataract   Family History    Problem Relation Age of Onset  . Hypertension Father   . Hypertension Mother   . Lung cancer Sister   . Diabetes Maternal Aunt   . Diabetes Maternal Uncle   . Diabetes Maternal Aunt   . Diabetes Maternal Uncle    Social History   Socioeconomic History  . Marital status: Married    Spouse name: Not on file  . Number of children: Not on file  . Years of education: Not on file  . Highest education level: Master's degree (e.g., MA, MS, MEng, MEd, MSW, MBA)  Occupational History  . Not on file  Social Needs  . Financial resource strain: Not hard at all  . Food insecurity:    Worry: Never true    Inability: Never true  . Transportation needs:    Medical: No    Non-medical: No  Tobacco Use  . Smoking status: Never Smoker  . Smokeless tobacco: Never Used  Substance and Sexual Activity  . Alcohol use: No  . Drug use: No  . Sexual activity: Not on file  Lifestyle  . Physical activity:    Days per week: 0 days    Minutes per session: 0 min  . Stress: Not at all  Relationships  . Social connections:    Talks on phone: More than three times a week    Gets together: Once a week    Attends religious service: 1 to 4  times per year    Active member of club or organization: Yes    Attends meetings of clubs or organizations: 1 to 4 times per year    Relationship status: Married  Other Topics Concern  . Not on file  Social History Narrative  . Not on file    Outpatient Encounter Medications as of 07/13/2018  Medication Sig  . amLODipine (NORVASC) 5 MG tablet TAKE 1 TABLET(5 MG) BY MOUTH DAILY  . baclofen (LIORESAL) 10 MG tablet TAKE 1/2 TABLET(5 MG) BY MOUTH TWICE DAILY  . diazepam (VALIUM) 2 MG tablet Take 1 tablet (2 mg total) by mouth as needed.  Marland Kitchen ketoconazole (NIZORAL) 2 % shampoo   . latanoprost (XALATAN) 0.005 % ophthalmic solution 1 drop at bedtime.  Marland Kitchen levothyroxine (SYNTHROID, LEVOTHROID) 50 MCG tablet TAKE 1 TABLET(50 MCG) BY MOUTH DAILY BEFORE BREAKFAST  .  losartan (COZAAR) 50 MG tablet TAKE 1 TABLET(50 MG) BY MOUTH DAILY  . simvastatin (ZOCOR) 40 MG tablet Take 1 tablet (40 mg total) by mouth daily.  Marland Kitchen spironolactone (ALDACTONE) 25 MG tablet TAKE 1 TABLET(25 MG) BY MOUTH DAILY  . VIAGRA 100 MG tablet TAKE 1/2 TABLET BY MOUTH AS NEEDED FOR ERECTILE DYSFUNCTION  . acebutolol (SECTRAL) 200 MG capsule TAKE 1 CAPSULE(200 MG) BY MOUTH DAILY  . amoxicillin-clavulanate (AUGMENTIN) 875-125 MG tablet Take 1 tablet by mouth 2 (two) times daily. For to 10 days  . COMBIGAN 0.2-0.5 % ophthalmic solution INT 1 GTT IN OU BID  . sertraline (ZOLOFT) 25 MG tablet Take 1 tablet (25 mg total) by mouth daily.  Marland Kitchen triamcinolone cream (KENALOG) 0.5 % Apply 1 application topically 2 (two) times daily. To affected areas, for up to 2 weeks.   No facility-administered encounter medications on file as of 07/13/2018.     Activities of Daily Living In your present state of health, do you have any difficulty performing the following activities: 07/13/2018  Hearing? Y  Comment has hearing aids   Vision? N  Difficulty concentrating or making decisions? Y  Walking or climbing stairs? N  Dressing or bathing? N  Doing errands, shopping? N  Preparing Food and eating ? N  Using the Toilet? N  In the past six months, have you accidently leaked urine? N  Do you have problems with loss of bowel control? N  Managing your Medications? N  Managing your Finances? N  Housekeeping or managing your Housekeeping? N  Some recent data might be hidden    Patient Care Team: [name], DO as PCP - General (Family Medicine) Oneta Rack, MD (Dermatology) Lequita Asal, MD as Referring Physician (Hematology and Oncology) Edrick Kins, DPM as Consulting Physician (Podiatry) Leandrew Koyanagi, MD as Referring Physician (Ophthalmology)   Assessment:   This is a routine wellness examination for Manuel Reynolds.  Exercise Activities and Dietary recommendations Current  Exercise Habits: The patient does not participate in regular exercise at present, Exercise limited by: None identified  Goals    . Increase water intake     Recommend drinking at least 6-8 glasses of water a day        Fall Risk Fall Risk  07/13/2018 06/16/2017 11/11/2016 03/17/2016 07/23/2015  Falls in the past year? No Yes No No No  Number falls in past yr: - 1 - - -  Injury with Fall? - No - - -  Follow up - Falls prevention discussed - - -   Is the patient's home free of loose throw rugs in  walkways, pet beds, electrical cords, etc?   yes      Grab bars in the bathroom? yes      Handrails on the stairs?   yes      Adequate lighting?   yes  Timed Get Up and Go Performed: Completed in 8 seconds with no use of assistive devices, steady gait. No intervention needed at this time.   Depression Screen PHQ 2/9 Scores 07/13/2018 06/16/2017 11/11/2016 03/17/2016  PHQ - 2 Score 0 0 0 0    Cognitive Function     6CIT Screen 07/13/2018 06/16/2017  What Year? 0 points 0 points  What month? 0 points 0 points  What time? 0 points 0 points  Count back from 20 0 points 0 points  Months in reverse 0 points 0 points  Repeat phrase 0 points 0 points  Total Score 0 0    Immunization History  Administered Date(s) Administered  . Influenza, High Dose Seasonal PF 08/17/2015  . Influenza-Unspecified 08/18/2016  . Pneumococcal Conjugate-13 10/08/2007  . Pneumococcal Polysaccharide-23 11/03/2013  . Zoster 11/03/2013    Qualifies for Shingles Vaccine?  Yes, discussed shingrix vaccine   Screening Tests Health Maintenance  Topic Date Due  . INFLUENZA VACCINE  06/24/2018  . TETANUS/TDAP  09/16/2025  . PNA vac Low Risk Adult  Completed   Cancer Screenings: Lung: Low Dose CT Chest recommended if Age 50-80 years, 30 pack-year currently smoking OR have quit w/in 15years. Patient does not qualify. Colorectal: completed, no longer required  Additional Screenings:  Hepatitis C Screening: not  indicated       Plan:    I have personally reviewed and addressed the Medicare Annual Wellness questionnaire and have noted the following in the patient's chart:  A. Medical and social history B. Use of alcohol, tobacco or illicit drugs  C. Current medications and supplements D. Functional ability and status E.  Nutritional status F.  Physical activity G. Advance directives H. List of other physicians I.  Hospitalizations, surgeries, and ER visits in previous 12 months J.  [place] such as hearing and vision if needed, cognitive and depression L. Referrals and appointments   In addition, I have reviewed and discussed with patient certain preventive protocols, quality metrics, and best practice recommendations. A written personalized care plan for preventive services as well as general preventive health recommendations were provided to patient.   Signed,  Tyler Aas, LPN Nurse Health Advisor   Nurse Notes:none

## 2018-07-13 NOTE — Patient Instructions (Signed)
[name] , Thank you for taking time to come for your Medicare Wellness Visit. I appreciate your ongoing commitment to your health goals. Please review the following plan we discussed and let me know if I can assist you in the future.   Screening recommendations/referrals: Colonoscopy: no longer required Recommended yearly ophthalmology/optometry visit for glaucoma screening and checkup Recommended yearly dental visit for hygiene and checkup  Vaccinations: Influenza vaccine: due 07/2018 Pneumococcal vaccine: completed series Tdap vaccine: up to date Shingles vaccine: shingrix eligible, check with your insurance company for coverage     Advanced directives: Please bring a copy of your health care power of attorney and living will to the office at your convenience.  Conditions/risks identified: recommend drinking at least 6-8 glasses of water a day.  Next appointment: Follow up in one year for your annual wellness exam.   Preventive Care 65 Years and Older, Male Preventive care refers to lifestyle choices and visits with your health care provider that can promote health and wellness. What does preventive care include?  A yearly physical exam. This is also called an annual well check.  Dental exams once or twice a year.  Routine eye exams. Ask your health care provider how often you should have your eyes checked.  Personal lifestyle choices, including:  Daily care of your teeth and gums.  Regular physical activity.  Eating a healthy diet.  Avoiding tobacco and drug use.  Limiting alcohol use.  Practicing safe sex.  Taking low doses of aspirin every day.  Taking vitamin and mineral supplements as recommended by your health care provider. What happens during an annual well check? The services and screenings done by your health care provider during your annual well check will depend on your age, overall health, lifestyle risk factors, and family history of  disease. Counseling  Your health care provider may ask you questions about your:  Alcohol use.  Tobacco use.  Drug use.  Emotional well-being.  Home and relationship well-being.  Sexual activity.  Eating habits.  History of falls.  Memory and ability to understand (cognition).  Work and work Statistician. Screening  You may have the following tests or measurements:  Height, weight, and BMI.  Blood pressure.  Lipid and cholesterol levels. These may be checked every 5 years, or more frequently if you are over 68 years old.  Skin check.  Lung cancer screening. You may have this screening every year starting at age 20 if you have a 30-pack-year history of smoking and currently smoke or have quit within the past 15 years.  Fecal occult blood test (FOBT) of the stool. You may have this test every year starting at age 25.  Flexible sigmoidoscopy or colonoscopy. You may have a sigmoidoscopy every 5 years or a colonoscopy every 10 years starting at age 26.  Prostate cancer screening. Recommendations will vary depending on your family history and other risks.  Hepatitis C blood test.  Hepatitis B blood test.  Sexually transmitted disease (STD) testing.  Diabetes screening. This is done by checking your blood sugar (glucose) after you have not eaten for a while (fasting). You may have this done every 1-3 years.  Abdominal aortic aneurysm (AAA) screening. You may need this if you are a current or former smoker.  Osteoporosis. You may be screened starting at age 43 if you are at high risk. Talk with your health care provider about your test results, treatment options, and if necessary, the need for more tests. Vaccines  Your  health care provider may recommend certain vaccines, such as:  Influenza vaccine. This is recommended every year.  Tetanus, diphtheria, and acellular pertussis (Tdap, Td) vaccine. You may need a Td booster every 10 years.  Zoster vaccine. You may  need this after age 61.  Pneumococcal 13-valent conjugate (PCV13) vaccine. One dose is recommended after age 47.  Pneumococcal polysaccharide (PPSV23) vaccine. One dose is recommended after age 73. Talk to your health care provider about which screenings and vaccines you need and how often you need them. This information is not intended to replace advice given to you by your health care provider. Make sure you discuss any questions you have with your health care provider. Document Released: 12/07/2015 Document Revised: 07/30/2016 Document Reviewed: 09/11/2015 Elsevier Interactive Patient Education  2017 [place] Prevention in the Home Falls can cause injuries. They can happen to people of all ages. There are many things you can do to make your home safe and to help prevent falls. What can I do on the outside of my home?  Regularly fix the edges of walkways and driveways and fix any cracks.  Remove anything that might make you trip as you walk through a door, such as a raised step or threshold.  Trim any bushes or trees on the path to your home.  Use bright outdoor lighting.  Clear any walking paths of anything that might make someone trip, such as rocks or tools.  Regularly check to see if handrails are loose or broken. Make sure that both sides of any steps have handrails.  Any raised decks and porches should have guardrails on the edges.  Have any leaves, snow, or ice cleared regularly.  Use sand or salt on walking paths during winter.  Clean up any spills in your garage right away. This includes oil or grease spills. What can I do in the bathroom?  Use night lights.  Install grab bars by the toilet and in the tub and shower. Do not use towel bars as grab bars.  Use non-skid mats or decals in the tub or shower.  If you need to sit down in the shower, use a plastic, non-slip stool.  Keep the floor dry. Clean up any water that spills on the floor as soon as it  happens.  Remove soap buildup in the tub or shower regularly.  Attach bath mats securely with double-sided non-slip rug tape.  Do not have throw rugs and other things on the floor that can make you trip. What can I do in the bedroom?  Use night lights.  Make sure that you have a light by your bed that is easy to reach.  Do not use any sheets or blankets that are too big for your bed. They should not hang down onto the floor.  Have a firm chair that has side arms. You can use this for support while you get dressed.  Do not have throw rugs and other things on the floor that can make you trip. What can I do in the kitchen?  Clean up any spills right away.  Avoid walking on wet floors.  Keep items that you use a lot in easy-to-reach places.  If you need to reach something above you, use a strong step stool that has a grab bar.  Keep electrical cords out of the way.  Do not use floor polish or wax that makes floors slippery. If you must use wax, use non-skid floor wax.  Do not  have throw rugs and other things on the floor that can make you trip. What can I do with my stairs?  Do not leave any items on the stairs.  Make sure that there are handrails on both sides of the stairs and use them. Fix handrails that are broken or loose. Make sure that handrails are as long as the stairways.  Check any carpeting to make sure that it is firmly attached to the stairs. Fix any carpet that is loose or worn.  Avoid having throw rugs at the top or bottom of the stairs. If you do have throw rugs, attach them to the floor with carpet tape.  Make sure that you have a light switch at the top of the stairs and the bottom of the stairs. If you do not have them, ask someone to add them for you. What else can I do to help prevent falls?  Wear shoes that:  Do not have high heels.  Have rubber bottoms.  Are comfortable and fit you well.  Are closed at the toe. Do not wear sandals.  If you  use a stepladder:  Make sure that it is fully opened. Do not climb a closed stepladder.  Make sure that both sides of the stepladder are locked into place.  Ask someone to hold it for you, if possible.  Clearly mark and make sure that you can see:  Any grab bars or handrails.  First and last steps.  Where the edge of each step is.  Use tools that help you move around (mobility aids) if they are needed. These include:  Canes.  Walkers.  Scooters.  Crutches.  Turn on the lights when you go into a dark area. Replace any light bulbs as soon as they burn out.  Set up your furniture so you have a clear path. Avoid moving your furniture around.  If any of your floors are uneven, fix them.  If there are any pets around you, be aware of where they are.  Review your medicines with your doctor. Some medicines can make you feel dizzy. This can increase your chance of falling. Ask your doctor what other things that you can do to help prevent falls. This information is not intended to replace advice given to you by your health care provider. Make sure you discuss any questions you have with your health care provider. Document Released: 09/06/2009 Document Revised: 04/17/2016 Document Reviewed: 12/15/2014 Elsevier Interactive Patient Education  2017 [employer].

## 2018-07-13 NOTE — Assessment & Plan Note (Signed)
Stable, chronic problem (since 2003) s/p radiation for lymphoma - Trend TSH 2.74 (05/2017) > 4.09 (11/2017)  Plan: 1. Continue current dose Levothyroxine 32mcg daily for now, - next testing interval 6 months for lab

## 2018-07-14 ENCOUNTER — Encounter: Payer: Self-pay | Admitting: Family Medicine

## 2018-07-14 LAB — HEMOGLOBIN A1C
Hgb A1c MFr Bld: 5.7 % of total Hgb — ABNORMAL HIGH (ref <5.7)
Mean Plasma Glucose: 117 (calc)
eAG (mmol/L): 6.5 (calc)

## 2018-07-14 NOTE — Assessment & Plan Note (Signed)
Well-controlled Pre-DM prior - now due for A1c today  Plan:  Check serum A1c today - f/u result 1. Not on any therapy currently  2. Encourage improved lifestyle - low carb, low sugar diet, reduce portion size, continue improving regular exercise as tolerated 3. Follow-up q 6 months lab

## 2018-07-14 NOTE — Assessment & Plan Note (Signed)
Mostly well controlled cholesterol on statin and limited lifestyle Last lipid panel 05/2017 ASCVD 10 yr risk score elevated  Plan: 1. Continue current meds - Simvastatin 40mg  - refill today 2. May consider continue ASA 81mg  for primary ASCVD risk reduction 3. Encourage improved lifestyle - low carb/cholesterol, reduce portion size, continue improving regular exercise  Next due for lipids in 6 months

## 2018-07-27 DIAGNOSIS — H401132 Primary open-angle glaucoma, bilateral, moderate stage: Secondary | ICD-10-CM | POA: Diagnosis not present

## 2018-08-03 DIAGNOSIS — H401132 Primary open-angle glaucoma, bilateral, moderate stage: Secondary | ICD-10-CM | POA: Diagnosis not present

## 2018-08-10 ENCOUNTER — Other Ambulatory Visit: Payer: Self-pay | Admitting: Family Medicine

## 2018-08-10 DIAGNOSIS — I10 Essential (primary) hypertension: Secondary | ICD-10-CM

## 2018-08-23 ENCOUNTER — Other Ambulatory Visit: Payer: Self-pay | Admitting: Family Medicine

## 2018-08-23 DIAGNOSIS — R600 Localized edema: Secondary | ICD-10-CM

## 2018-08-23 DIAGNOSIS — I10 Essential (primary) hypertension: Secondary | ICD-10-CM

## 2018-09-02 ENCOUNTER — Ambulatory Visit (INDEPENDENT_AMBULATORY_CARE_PROVIDER_SITE_OTHER): Payer: Medicare Other

## 2018-09-02 DIAGNOSIS — Z23 Encounter for immunization: Secondary | ICD-10-CM

## 2018-09-10 DIAGNOSIS — H401132 Primary open-angle glaucoma, bilateral, moderate stage: Secondary | ICD-10-CM | POA: Diagnosis not present

## 2018-09-16 ENCOUNTER — Other Ambulatory Visit: Payer: Self-pay | Admitting: Family Medicine

## 2018-09-16 DIAGNOSIS — N529 Male erectile dysfunction, unspecified: Secondary | ICD-10-CM

## 2018-10-04 ENCOUNTER — Other Ambulatory Visit: Payer: Self-pay | Admitting: Family Medicine

## 2018-10-04 DIAGNOSIS — I10 Essential (primary) hypertension: Secondary | ICD-10-CM

## 2018-10-14 DIAGNOSIS — H401132 Primary open-angle glaucoma, bilateral, moderate stage: Secondary | ICD-10-CM | POA: Diagnosis not present

## 2018-12-03 ENCOUNTER — Other Ambulatory Visit: Payer: Self-pay | Admitting: Nurse Practitioner

## 2018-12-03 DIAGNOSIS — R600 Localized edema: Secondary | ICD-10-CM

## 2018-12-03 DIAGNOSIS — I10 Essential (primary) hypertension: Secondary | ICD-10-CM

## 2019-01-07 ENCOUNTER — Other Ambulatory Visit: Payer: Self-pay

## 2019-01-07 DIAGNOSIS — N183 Chronic kidney disease, stage 3 unspecified: Secondary | ICD-10-CM

## 2019-01-07 DIAGNOSIS — E034 Atrophy of thyroid (acquired): Secondary | ICD-10-CM

## 2019-01-07 DIAGNOSIS — I10 Essential (primary) hypertension: Secondary | ICD-10-CM

## 2019-01-07 DIAGNOSIS — E782 Mixed hyperlipidemia: Secondary | ICD-10-CM

## 2019-01-07 DIAGNOSIS — C8200 Follicular lymphoma grade I, unspecified site: Secondary | ICD-10-CM

## 2019-01-07 DIAGNOSIS — R7303 Prediabetes: Secondary | ICD-10-CM

## 2019-01-10 ENCOUNTER — Other Ambulatory Visit: Payer: Medicare Other

## 2019-01-10 DIAGNOSIS — E782 Mixed hyperlipidemia: Secondary | ICD-10-CM | POA: Diagnosis not present

## 2019-01-10 DIAGNOSIS — I10 Essential (primary) hypertension: Secondary | ICD-10-CM | POA: Diagnosis not present

## 2019-01-10 DIAGNOSIS — N183 Chronic kidney disease, stage 3 (moderate): Secondary | ICD-10-CM | POA: Diagnosis not present

## 2019-01-10 DIAGNOSIS — R7303 Prediabetes: Secondary | ICD-10-CM | POA: Diagnosis not present

## 2019-01-10 DIAGNOSIS — C8200 Follicular lymphoma grade I, unspecified site: Secondary | ICD-10-CM | POA: Diagnosis not present

## 2019-01-10 DIAGNOSIS — E034 Atrophy of thyroid (acquired): Secondary | ICD-10-CM | POA: Diagnosis not present

## 2019-01-11 LAB — CBC WITH DIFFERENTIAL/PLATELET
Absolute Monocytes: 821 cells/uL (ref 200–950)
Basophils Absolute: 43 cells/uL (ref 0–200)
Basophils Relative: 0.6 %
Eosinophils Absolute: 79 cells/uL (ref 15–500)
Eosinophils Relative: 1.1 %
HCT: 42.2 % (ref 38.5–50.0)
Hemoglobin: 14.8 g/dL (ref 13.2–17.1)
Lymphs Abs: 3067 cells/uL (ref 850–3900)
MCH: 30.8 pg (ref 27.0–33.0)
MCHC: 35.1 g/dL (ref 32.0–36.0)
MCV: 87.7 fL (ref 80.0–100.0)
MPV: 12.4 fL (ref 7.5–12.5)
Monocytes Relative: 11.4 %
Neutro Abs: 3190 cells/uL (ref 1500–7800)
Neutrophils Relative %: 44.3 %
Platelets: 228 10*3/uL (ref 140–400)
RBC: 4.81 10*6/uL (ref 4.20–5.80)
RDW: 14.9 % (ref 11.0–15.0)
Total Lymphocyte: 42.6 %
WBC: 7.2 10*3/uL (ref 3.8–10.8)

## 2019-01-11 LAB — T4, FREE: Free T4: 1.2 ng/dL (ref 0.8–1.8)

## 2019-01-11 LAB — COMPLETE METABOLIC PANEL WITH GFR
AG Ratio: 1.7 (calc) (ref 1.0–2.5)
ALT: 16 U/L (ref 9–46)
AST: 21 U/L (ref 10–35)
Albumin: 4 g/dL (ref 3.6–5.1)
Alkaline phosphatase (APISO): 59 U/L (ref 35–144)
BUN/Creatinine Ratio: 21 (calc) (ref 6–22)
BUN: 30 mg/dL — ABNORMAL HIGH (ref 7–25)
CO2: 23 mmol/L (ref 20–32)
Calcium: 9.4 mg/dL (ref 8.6–10.3)
Chloride: 106 mmol/L (ref 98–110)
Creat: 1.45 mg/dL — ABNORMAL HIGH (ref 0.70–1.11)
GFR, Est African American: 50 mL/min/{1.73_m2} — ABNORMAL LOW (ref >=60)
GFR, Est Non African American: 43 mL/min/{1.73_m2} — ABNORMAL LOW (ref >=60)
Globulin: 2.4 g/dL (calc) (ref 1.9–3.7)
Glucose, Bld: 90 mg/dL (ref 65–99)
Potassium: 4.4 mmol/L (ref 3.5–5.3)
Sodium: 138 mmol/L (ref 135–146)
Total Bilirubin: 0.7 mg/dL (ref 0.2–1.2)
Total Protein: 6.4 g/dL (ref 6.1–8.1)

## 2019-01-11 LAB — LIPID PANEL
Cholesterol: 157 mg/dL (ref <200)
HDL: 33 mg/dL — ABNORMAL LOW (ref >=40)
LDL Cholesterol (Calc): 103 mg/dL (calc) — ABNORMAL HIGH
Non-HDL Cholesterol (Calc): 124 mg/dL (calc) (ref <130)
Total CHOL/HDL Ratio: 4.8 (calc) (ref <5.0)
Triglycerides: 109 mg/dL (ref <150)

## 2019-01-11 LAB — HEMOGLOBIN A1C
Hgb A1c MFr Bld: 5.9 % of total Hgb — ABNORMAL HIGH (ref <5.7)
Mean Plasma Glucose: 123 (calc)
eAG (mmol/L): 6.8 (calc)

## 2019-01-11 LAB — TSH: TSH: 4.87 mIU/L — ABNORMAL HIGH (ref 0.40–4.50)

## 2019-01-17 ENCOUNTER — Other Ambulatory Visit: Payer: Self-pay

## 2019-01-17 ENCOUNTER — Encounter: Payer: Self-pay | Admitting: Family Medicine

## 2019-01-17 ENCOUNTER — Ambulatory Visit (INDEPENDENT_AMBULATORY_CARE_PROVIDER_SITE_OTHER): Payer: Medicare Other | Admitting: Family Medicine

## 2019-01-17 VITALS — BP 126/71 | HR 75 | Resp 16 | Ht 66.0 in | Wt 160.0 lb

## 2019-01-17 DIAGNOSIS — C8200 Follicular lymphoma grade I, unspecified site: Secondary | ICD-10-CM | POA: Diagnosis not present

## 2019-01-17 DIAGNOSIS — E782 Mixed hyperlipidemia: Secondary | ICD-10-CM | POA: Diagnosis not present

## 2019-01-17 DIAGNOSIS — I129 Hypertensive chronic kidney disease with stage 1 through stage 4 chronic kidney disease, or unspecified chronic kidney disease: Secondary | ICD-10-CM

## 2019-01-17 DIAGNOSIS — N183 Chronic kidney disease, stage 3 unspecified: Secondary | ICD-10-CM

## 2019-01-17 DIAGNOSIS — R7303 Prediabetes: Secondary | ICD-10-CM | POA: Diagnosis not present

## 2019-01-17 DIAGNOSIS — E034 Atrophy of thyroid (acquired): Secondary | ICD-10-CM

## 2019-01-17 NOTE — Assessment & Plan Note (Signed)
Mostly well controlled cholesterol on statin and limited lifestyle Last lipid panel 12/2018 ASCVD 10 yr risk score elevated  Plan: 1. Continue current meds - Simvastatin 40mg  2. Encourage improved lifestyle - low carb/cholesterol, reduce portion size, continue improving regular exercise  Check lipids yearly

## 2019-01-17 NOTE — Progress Notes (Signed)
Subjective:    Patient ID: Manuel Reynolds, male    DOB: Aug 12, 1930, 85 y.o.   MRN: [id number]  Manuel Reynolds is a 85 y.o. male presenting on 01/17/2019 for Chronic Kidney Disease  Accompanied by wife, Manuel Reynolds, for additional history and information.  HPI   CHRONIC HTN, complicated by CKD-III No new concerns. Recent lab 12/2018 stable Creatinine. Current Meds - Losartan 50mg  daily, Spironolasctone 25mg , Amlodapine 5mg , Acetabutolol 200mg  (due to history of tachycardia) Reports good compliance, took meds today. Tolerating well, w/o complaints. Denies CP, dyspnea, HA, edema, dizziness / lightheadedness  Pre-Diabetes / Elevated A1c Last lab 12/2018 showed A1c 5.9 slight inc trend. CBGs:None Meds:Never on med Currently on ARB Lifestyle: No specific dietary changes, eats balanced mostly Limited regular exercise Denies hypoglycemia, polyuria, visual changes, numbness or tingling.  Hypothyroidism - Chronic problem since 2003, secondary to radiation therapy for lymphoma, then only had problem with low thyroid for past 2-3 years. - Last lab 12/2018, slightly elevated TSH still in 4 range, otherwise normal free T4 - He does not endorse any new symptoms related to thyroid - Continues Levothyroxine 52mcg daily - Denies energy or fatigue change, hair changes, temperature instability  HYPERLIPIDEMIA: - Reports no concerns. Last lipid panel 12/2018, controlled  - Currently taking Simvastatin 40mg , tolerating well without side effects or myalgias  Lymphoma, nodular lymphocytic - remission Followed by [facility] Oncology Dr Manuel Reynolds, has a remote history NHL in 1993, s/p chemotherapy and has been in remission for >15 years. - No new concerns - Next apt with Oncology is July 2020  Health Maintenance: Up to date vaccines, Flu, Pneumonia series, TDap  Depression screen [facility] 2/9 01/17/2019 01/17/2019 07/13/2018  Decreased Interest 0 0 0  Down, Depressed, Hopeless 0 0 0  PHQ - 2 Score 0 0 0    Altered sleeping - 0 -  Tired, decreased energy - 0 -  Change in appetite - 0 -  Feeling bad or failure about yourself  - 0 -  Trouble concentrating - 0 -  Moving slowly or fidgety/restless - 0 -  Suicidal thoughts - 0 -  PHQ-9 Score - 0 -    Past Medical History:  Diagnosis Date  . Anxiety   . Hypertension   . Lymphoma malignant, nodular, lymphocytic ([place]) 05/25/2015  . Thyroid disease    Past Surgical History:  Procedure Laterality Date  . EYE SURGERY  2010   cataract   Social History   Socioeconomic History  . Marital status: Married    Spouse name: Not on file  . Number of children: Not on file  . Years of education: Not on file  . Highest education level: Master's degree (e.g., MA, MS, MEng, MEd, MSW, MBA)  Occupational History  . Not on file  Social Needs  . Financial resource strain: Not hard at all  . Food insecurity:    Worry: Never true    Inability: Never true  . Transportation needs:    Medical: No    Non-medical: No  Tobacco Use  . Smoking status: Never Smoker  . Smokeless tobacco: Never Used  Substance and Sexual Activity  . Alcohol use: No  . Drug use: No  . Sexual activity: Not on file  Lifestyle  . Physical activity:    Days per week: 0 days    Minutes per session: 0 min  . Stress: Not at all  Relationships  . Social connections:    Talks on phone: More than three times a week  Gets together: Once a week    Attends religious service: 1 to 4 times per year    Active member of club or organization: Yes    Attends meetings of clubs or organizations: 1 to 4 times per year    Relationship status: Married  . Intimate partner violence:    Fear of current or ex partner: No    Emotionally abused: No    Physically abused: No    Forced sexual activity: No  Other Topics Concern  . Not on file  Social History Narrative  . Not on file   Family History  Problem Relation Age of Onset  . Hypertension Father   . Hypertension Mother   . Lung  cancer Sister   . Diabetes Maternal Aunt   . Diabetes Maternal Uncle   . Diabetes Maternal Aunt   . Diabetes Maternal Uncle    Current Outpatient Medications on File Prior to Visit  Medication Sig  . acebutolol (SECTRAL) 200 MG capsule TAKE 1 CAPSULE(200 MG) BY MOUTH DAILY  . amLODipine (NORVASC) 5 MG tablet TAKE 1 TABLET(5 MG) BY MOUTH DAILY  . latanoprost (XALATAN) 0.005 % ophthalmic solution 1 drop at bedtime.  Marland Kitchen levothyroxine (SYNTHROID, LEVOTHROID) 50 MCG tablet Take 1 tablet (50 mcg total) by mouth daily before breakfast.  . losartan (COZAAR) 50 MG tablet TAKE 1 TABLET(50 MG) BY MOUTH DAILY  . simvastatin (ZOCOR) 40 MG tablet Take 1 tablet (40 mg total) by mouth daily.  Marland Kitchen spironolactone (ALDACTONE) 25 MG tablet TAKE 1 TABLET(25 MG) BY MOUTH DAILY  . VIAGRA 100 MG tablet TAKE 1/2 TABLET BY MOUTH AS NEEDED FOR ERECTILE DYSFUNCTION.   No current facility-administered medications on file prior to visit.     Review of Systems  Constitutional: Negative for activity change, appetite change, chills, diaphoresis, fatigue and fever.  HENT: Negative for congestion and hearing loss.   Eyes: Negative for visual disturbance.  Respiratory: Negative for apnea, cough, choking, chest tightness, shortness of breath and wheezing.   Cardiovascular: Negative for chest pain, palpitations and leg swelling.  Gastrointestinal: Negative for abdominal pain, anal bleeding, blood in stool, constipation, diarrhea, nausea and vomiting.  Endocrine: Negative for cold intolerance.  Genitourinary: Negative for decreased urine volume, difficulty urinating, dysuria, frequency and hematuria.  Musculoskeletal: Negative for arthralgias, back pain and neck pain.  Skin: Negative for rash.  Allergic/Immunologic: Negative for environmental allergies.  Neurological: Negative for dizziness, weakness, light-headedness, numbness and headaches.  Hematological: Negative for adenopathy.  Psychiatric/Behavioral: Negative for  behavioral problems, dysphoric mood and sleep disturbance. The patient is not nervous/anxious.    Per HPI unless specifically indicated above     Objective:    BP 126/71   Pulse 75   Resp 16   Ht 5\' 6"  (1.676 m)   Wt 160 lb (72.6 kg)   BMI 25.82 kg/m   Wt Readings from Last 3 Encounters:  01/17/19 160 lb (72.6 kg)  07/13/18 158 lb 12.8 oz (72 kg)  07/13/18 158 lb 12.8 oz (72 kg)    Physical Exam Vitals signs and nursing note reviewed.  Constitutional:      General: He is not in acute distress.    Appearance: He is well-developed. He is not diaphoretic.  HENT:     Head: Normocephalic and atraumatic.  Eyes:     Conjunctiva/sclera: Conjunctivae normal.     Pupils: Pupils are equal, round, and reactive to light.  Neck:     Musculoskeletal: Normal range of motion and neck supple.  Thyroid: No thyromegaly.  Cardiovascular:     Rate and Rhythm: Normal rate and regular rhythm.     Heart sounds: Normal heart sounds. No murmur.  Pulmonary:     Effort: Pulmonary effort is normal. No respiratory distress.     Breath sounds: Normal breath sounds. No wheezing or rales.  Abdominal:     General: Bowel sounds are normal. There is no distension.     Palpations: Abdomen is soft. There is no mass.     Tenderness: There is no abdominal tenderness.  Musculoskeletal: Normal range of motion.        General: No tenderness.     Right lower leg: Edema (trace) present.     Left lower leg: Edema (trace) present.  Lymphadenopathy:     Cervical: No cervical adenopathy.  Skin:    General: Skin is warm and dry.     Findings: No rash.  Neurological:     Mental Status: He is alert and oriented to person, place, and time.  Psychiatric:        Behavior: Behavior normal.     Recent Labs    07/13/18 1144 01/10/19 0907  HGBA1C 5.7* 5.9*     Results for orders placed or performed in visit on 01/07/19  T4, free  Result Value Ref Range   Free T4 1.2 0.8 - 1.8 ng/dL  TSH  Result Value  Ref Range   TSH 4.87 (H) 0.40 - 4.50 mIU/L  Lipid panel  Result Value Ref Range   Cholesterol 157 <200 mg/dL   HDL 33 (L) > OR = 40 mg/dL   Triglycerides 109 <150 mg/dL   LDL Cholesterol (Calc) 103 (H) mg/dL (calc)   Total CHOL/HDL Ratio 4.8 <5.0 (calc)   Non-HDL Cholesterol (Calc) 124 <130 mg/dL (calc)  COMPLETE METABOLIC PANEL WITH GFR  Result Value Ref Range   Glucose, Bld 90 65 - 99 mg/dL   BUN 30 (H) 7 - 25 mg/dL   Creat 1.45 (H) 0.70 - 1.11 mg/dL   GFR, Est Non African American 43 (L) > OR = 60 mL/min/1.59m2   GFR, Est African American 50 (L) > OR = 60 mL/min/1.34m2   BUN/Creatinine Ratio 21 6 - 22 (calc)   Sodium 138 135 - 146 mmol/L   Potassium 4.4 3.5 - 5.3 mmol/L   Chloride 106 98 - 110 mmol/L   CO2 23 20 - 32 mmol/L   Calcium 9.4 8.6 - 10.3 mg/dL   Total Protein 6.4 6.1 - 8.1 g/dL   Albumin 4.0 3.6 - 5.1 g/dL   Globulin 2.4 1.9 - 3.7 g/dL (calc)   AG Ratio 1.7 1.0 - 2.5 (calc)   Total Bilirubin 0.7 0.2 - 1.2 mg/dL   Alkaline phosphatase (APISO) 59 35 - 144 U/L   AST 21 10 - 35 U/L   ALT 16 9 - 46 U/L  CBC with Differential/Platelet  Result Value Ref Range   WBC 7.2 3.8 - 10.8 Thousand/uL   RBC 4.81 4.20 - 5.80 Million/uL   Hemoglobin 14.8 13.2 - 17.1 g/dL   HCT 42.2 38.5 - 50.0 %   MCV 87.7 80.0 - 100.0 fL   MCH 30.8 27.0 - 33.0 pg   MCHC 35.1 32.0 - 36.0 g/dL   RDW 14.9 11.0 - 15.0 %   Platelets 228 140 - 400 Thousand/uL   MPV 12.4 7.5 - 12.5 fL   Neutro Abs 3,190 1,500 - 7,800 cells/uL   Lymphs Abs 3,067 850 - 3,900 cells/uL  Absolute Monocytes 821 200 - 950 cells/uL   Eosinophils Absolute 79 15 - 500 cells/uL   Basophils Absolute 43 0 - 200 cells/uL   Neutrophils Relative % 44.3 %   Total Lymphocyte 42.6 %   Monocytes Relative 11.4 %   Eosinophils Relative 1.1 %   Basophils Relative 0.6 %  Hemoglobin A1c  Result Value Ref Range   Hgb A1c MFr Bld 5.9 (H) <5.7 % of total Hgb   Mean Plasma Glucose 123 (calc)   eAG (mmol/L) 6.8 (calc)        Assessment & Plan:   Problem List Items Addressed This Visit    Benign hypertension with CKD (chronic kidney disease) stage III (HCC) - Primary    Normal BP currently controlled - Home BP readings none  Complication with CKD-III   Plan:  1. Continue current BP regimen Losartan 50mg  daily, Spironolasctone 25mg , Amlodapine 5mg , Acetabutolol 200mg  2. Encourage improved lifestyle - low sodium diet, regular exercise 3. Continue monitor BP outside office, bring readings to next visit, if persistently >140/90 or new symptoms notify office sooner 4. Follow-up 6 months      CKD (chronic kidney disease), stage III (HCC)    Stable CKD with Cr 1.45 likely secondary to HTN, also possibly with lymphoma s/p chemotherapy Cr baseline 1.4 to 1.5  Plan: 1. Monitor Cr regularly q 6 to 12 mo- also followed by Heme/Onc [facility] 2. Remain off NSAIDs. Encouraged Tylenol use PRN pain 3. Improve hydration 4. Control BP      Hyperlipidemia    Mostly well controlled cholesterol on statin and limited lifestyle Last lipid panel 12/2018 ASCVD 10 yr risk score elevated  Plan: 1. Continue current meds - Simvastatin 40mg  2. Encourage improved lifestyle - low carb/cholesterol, reduce portion size, continue improving regular exercise  Check lipids yearly      Hypothyroidism    Stable, chronic problem (since 2003) s/p radiation for lymphoma  Plan: 1. Continue current dose Levothyroxine 20mcg daily - Future TSH q 6-12 mo, may consider draw at next follow-up if needed      Lymphoma malignant, nodular, lymphocytic ([place])    Stable, without known recurrence Followed by [facility] Oncology yearly Next follow-up scheduled 05/2019 - Sent notification to Oncology about recent lab results for their review for CBC surveillance      Pre-diabetes    Well-controlled Pre-DM, slightly elevated to A1c 5.9 now  Plan:  1. Not on any therapy currently  2. Encourage improved lifestyle - low carb, low sugar  diet, reduce portion size, continue improving regular exercise as tolerated 3. Follow-up q 6 months A1c         No orders of the defined types were placed in this encounter.   Follow up plan: Return in about 6 months (around 07/18/2019) for 6 month follow-up HTN, PreDM A1c.  Nobie Putnam, DO [facility] 01/17/2019, 10:30 AM

## 2019-01-17 NOTE — Assessment & Plan Note (Signed)
Stable, without known recurrence Followed by [facility] Oncology yearly Next follow-up scheduled 05/2019 - Sent notification to Oncology about recent lab results for their review for CBC surveillance

## 2019-01-17 NOTE — Assessment & Plan Note (Addendum)
Stable CKD with Cr 1.45 likely secondary to HTN, also possibly with lymphoma s/p chemotherapy Cr baseline 1.4 to 1.5  Plan: 1. Monitor Cr regularly q 6 to 12 mo- also followed by Heme/Onc [facility] 2. Remain off NSAIDs. Encouraged Tylenol use PRN pain 3. Improve hydration 4. Control BP

## 2019-01-17 NOTE — Patient Instructions (Addendum)
Thank you for coming to the office today.  Keep up the good work  Stay active  No changes to meds.  Will send lab results to Dr Mike Gip for Oncology, next apt July 2020  No vaccines due.   Please schedule a Follow-up Appointment to: Return in about 6 months (around 07/18/2019) for 6 month follow-up HTN, PreDM A1c.  If you have any other questions or concerns, please feel free to call the office or send a message through [facility]. You may also schedule an earlier appointment if necessary.  Additionally, you may be receiving a survey about your experience at our office within a few days to 1 week by e-mail or mail. We value your feedback.  Nobie Putnam, DO [facility]

## 2019-01-17 NOTE — Assessment & Plan Note (Signed)
Stable, chronic problem (since 2003) s/p radiation for lymphoma  Plan: 1. Continue current dose Levothyroxine 43mcg daily - Future TSH q 6-12 mo, may consider draw at next follow-up if needed

## 2019-01-17 NOTE — Assessment & Plan Note (Signed)
Well-controlled Pre-DM, slightly elevated to A1c 5.9 now  Plan:  1. Not on any therapy currently  2. Encourage improved lifestyle - low carb, low sugar diet, reduce portion size, continue improving regular exercise as tolerated 3. Follow-up q 6 months A1c

## 2019-01-17 NOTE — Assessment & Plan Note (Signed)
Normal BP currently controlled - Home BP readings none  Complication with CKD-III   Plan:  1. Continue current BP regimen Losartan 50mg  daily, Spironolasctone 25mg , Amlodapine 5mg , Acetabutolol 200mg  2. Encourage improved lifestyle - low sodium diet, regular exercise 3. Continue monitor BP outside office, bring readings to next visit, if persistently >140/90 or new symptoms notify office sooner 4. Follow-up 6 months

## 2019-04-06 ENCOUNTER — Other Ambulatory Visit: Payer: Self-pay | Admitting: Family Medicine

## 2019-04-06 DIAGNOSIS — N529 Male erectile dysfunction, unspecified: Secondary | ICD-10-CM

## 2019-04-29 DIAGNOSIS — H35343 Macular cyst, hole, or pseudohole, bilateral: Secondary | ICD-10-CM | POA: Diagnosis not present

## 2019-06-03 ENCOUNTER — Other Ambulatory Visit: Payer: Medicare Other

## 2019-06-03 ENCOUNTER — Ambulatory Visit: Payer: Medicare Other | Admitting: Hematology and Oncology

## 2019-07-19 ENCOUNTER — Ambulatory Visit: Payer: Medicare Other

## 2019-07-20 ENCOUNTER — Ambulatory Visit: Payer: Medicare Other | Admitting: Family Medicine

## 2019-07-20 ENCOUNTER — Other Ambulatory Visit: Payer: Self-pay | Admitting: Family Medicine

## 2019-07-20 DIAGNOSIS — E782 Mixed hyperlipidemia: Secondary | ICD-10-CM

## 2019-07-20 DIAGNOSIS — E034 Atrophy of thyroid (acquired): Secondary | ICD-10-CM

## 2019-07-20 DIAGNOSIS — I10 Essential (primary) hypertension: Secondary | ICD-10-CM

## 2019-07-25 ENCOUNTER — Other Ambulatory Visit: Payer: Self-pay | Admitting: *Deleted

## 2019-07-25 DIAGNOSIS — C8299 Follicular lymphoma, unspecified, extranodal and solid organ sites: Secondary | ICD-10-CM

## 2019-07-28 ENCOUNTER — Other Ambulatory Visit: Payer: Self-pay

## 2019-07-28 ENCOUNTER — Encounter: Payer: Self-pay | Admitting: Family Medicine

## 2019-07-28 ENCOUNTER — Ambulatory Visit (INDEPENDENT_AMBULATORY_CARE_PROVIDER_SITE_OTHER): Payer: Medicare Other | Admitting: Family Medicine

## 2019-07-28 VITALS — BP 126/68 | HR 87 | Temp 98.6°F | Resp 16 | Ht 66.0 in | Wt 159.0 lb

## 2019-07-28 DIAGNOSIS — I129 Hypertensive chronic kidney disease with stage 1 through stage 4 chronic kidney disease, or unspecified chronic kidney disease: Secondary | ICD-10-CM | POA: Diagnosis not present

## 2019-07-28 DIAGNOSIS — Z23 Encounter for immunization: Secondary | ICD-10-CM

## 2019-07-28 DIAGNOSIS — R7303 Prediabetes: Secondary | ICD-10-CM | POA: Diagnosis not present

## 2019-07-28 DIAGNOSIS — E034 Atrophy of thyroid (acquired): Secondary | ICD-10-CM

## 2019-07-28 DIAGNOSIS — C8200 Follicular lymphoma grade I, unspecified site: Secondary | ICD-10-CM | POA: Diagnosis not present

## 2019-07-28 DIAGNOSIS — N183 Chronic kidney disease, stage 3 unspecified: Secondary | ICD-10-CM

## 2019-07-28 NOTE — Assessment & Plan Note (Signed)
Stable CKD likely secondary to HTN, also possibly with lymphoma s/p chemotherapy Cr baseline 1.4 to 1.5  Plan: 1. Monitor Cr as planned, defer lab today 2. Remain off NSAIDs. Encouraged Tylenol use PRN pain 3. Improve hydration 4. Control BP

## 2019-07-28 NOTE — Assessment & Plan Note (Signed)
Normal BP currently controlled - Home BP readings none  Complication with CKD-III    Plan:  1. Continue current BP regimen Losartan 50mg  daily, Spironolasctone 25mg , Amlodapine 5mg , Acetabutolol 200mg  2. Encourage improved lifestyle - low sodium diet, regular exercise 3. Continue monitor BP outside office, bring readings to next visit, if persistently >140/90 or new symptoms notify office sooner 4. Follow-up 6 months

## 2019-07-28 NOTE — Assessment & Plan Note (Signed)
Stable, without known recurrence Followed by [facility] Oncology yearly Next follow-up scheduled 08/04/19 - however patient says he wishes to cancel apt, gave him their phone number and asked him to update Oncology and make an informed decision, we can check labs here if need

## 2019-07-28 NOTE — Assessment & Plan Note (Signed)
Stable, will defer A1c today  Plan:  1. Not on any therapy currently  2. Encourage improved lifestyle - low carb, low sugar diet, reduce portion size, continue improving regular exercise as tolerated 3. Follow-up q 6 months

## 2019-07-28 NOTE — Progress Notes (Signed)
Subjective:    Patient ID: Manuel Reynolds, male    DOB: 11-05-1930, 85 y.o.   MRN: [id number]  Manuel Reynolds is a 85 y.o. male presenting on 07/28/2019 for Hypothyroidism (6 month follow up)   HPI   CHRONIC HTN, complicated by CKD-III No new concerns. Recent lab 12/2018 stable Creatinine. Current Meds - Losartan 50mg  daily, Spironolasctone 25mg , Amlodapine 5mg , Acetabutolol 200mg  (due to history of tachycardia) Reports good compliance, took meds today. Tolerating well, w/o complaints. Denies CP, dyspnea, HA, edema, dizziness / lightheadedness  Pre-Diabetes / Elevated A1c Last A1c 5.9. CBGs:None Meds:Never on med Currently on ARB Lifestyle: No specific dietary changes, eats balanced mostly but admits some inc cookies / sweets Limited regular exercise Denies hypoglycemia, polyuria, visual changes, numbness or tingling.  Hypothyroidism - Chronic problem since 2003, secondary to radiation therapy for lymphoma, then only had problem with low thyroid for past 2-3 years. - Last lab 12/2018, slightly elevated TSH still in 4 range, otherwise normal free T4 - He does not endorse any new symptoms related to thyroid - Continues Levothyroxine 43mcg daily - Denies energy or fatigue change, hair changes, temperature instability  Lymphoma, nodular lymphocytic - remission Followed by [facility] Oncology Dr Mike Gip, has a remote history NHL in 1993, s/p chemotherapy and has been in remission for >15 years. - No new concerns - Next apt with Oncology is 08/04/19 - however he says he may cancel apt  Additional complaint Left Knee Pain - says he bumped his knee other day on car and it was sore.   Health Maintenance: Due for Flu Shot, will receive today    Depression screen [facility] 2/9 07/28/2019 01/17/2019 01/17/2019  Decreased Interest 0 0 0  Down, Depressed, Hopeless 0 0 0  PHQ - 2 Score 0 0 0  Altered sleeping - - 0  Tired, decreased energy - - 0  Change in appetite - - 0  Feeling bad or failure  about yourself  - - 0  Trouble concentrating - - 0  Moving slowly or fidgety/restless - - 0  Suicidal thoughts - - 0  PHQ-9 Score - - 0    Social History   Tobacco Use  . Smoking status: Never Smoker  . Smokeless tobacco: Never Used  Substance Use Topics  . Alcohol use: No  . Drug use: No    Review of Systems Per HPI unless specifically indicated above     Objective:    BP 126/68   Pulse 87   Temp 98.6 F (37 C) (Oral)   Resp 16   Ht 5\' 6"  (1.676 m)   Wt 159 lb (72.1 kg)   BMI 25.66 kg/m   Wt Readings from Last 3 Encounters:  07/28/19 159 lb (72.1 kg)  01/17/19 160 lb (72.6 kg)  07/13/18 158 lb 12.8 oz (72 kg)    Physical Exam Vitals signs and nursing note reviewed.  Constitutional:      General: He is not in acute distress.    Appearance: He is well-developed. He is not diaphoretic.     Comments: Well-appearing, comfortable, cooperative  HENT:     Head: Normocephalic and atraumatic.  Eyes:     General:        Right eye: No discharge.        Left eye: No discharge.     Conjunctiva/sclera: Conjunctivae normal.  Neck:     Musculoskeletal: Normal range of motion and neck supple.     Thyroid: No thyromegaly.  Cardiovascular:  Rate and Rhythm: Normal rate and regular rhythm.     Heart sounds: Murmur (2/6 systolic) present.  Pulmonary:     Effort: Pulmonary effort is normal. No respiratory distress.     Breath sounds: Normal breath sounds. No wheezing or rales.  Musculoskeletal: Normal range of motion.     Comments: Left knee non tender, no erythema, no ecchymosis, no edema  Lymphadenopathy:     Cervical: No cervical adenopathy.  Skin:    General: Skin is warm and dry.     Findings: No erythema or rash.  Neurological:     Mental Status: He is alert and oriented to person, place, and time.  Psychiatric:        Behavior: Behavior normal.     Comments: Well groomed, good eye contact, normal speech and thoughts      Recent Labs    01/10/19 0907   HGBA1C 5.9*    Results for orders placed or performed in visit on 01/07/19  T4, free  Result Value Ref Range   Free T4 1.2 0.8 - 1.8 ng/dL  TSH  Result Value Ref Range   TSH 4.87 (H) 0.40 - 4.50 mIU/L  Lipid panel  Result Value Ref Range   Cholesterol 157 <200 mg/dL   HDL 33 (L) > OR = 40 mg/dL   Triglycerides 109 <150 mg/dL   LDL Cholesterol (Calc) 103 (H) mg/dL (calc)   Total CHOL/HDL Ratio 4.8 <5.0 (calc)   Non-HDL Cholesterol (Calc) 124 <130 mg/dL (calc)  COMPLETE METABOLIC PANEL WITH GFR  Result Value Ref Range   Glucose, Bld 90 65 - 99 mg/dL   BUN 30 (H) 7 - 25 mg/dL   Creat 1.45 (H) 0.70 - 1.11 mg/dL   GFR, Est Non African American 43 (L) > OR = 60 mL/min/1.79m2   GFR, Est African American 50 (L) > OR = 60 mL/min/1.73m2   BUN/Creatinine Ratio 21 6 - 22 (calc)   Sodium 138 135 - 146 mmol/L   Potassium 4.4 3.5 - 5.3 mmol/L   Chloride 106 98 - 110 mmol/L   CO2 23 20 - 32 mmol/L   Calcium 9.4 8.6 - 10.3 mg/dL   Total Protein 6.4 6.1 - 8.1 g/dL   Albumin 4.0 3.6 - 5.1 g/dL   Globulin 2.4 1.9 - 3.7 g/dL (calc)   AG Ratio 1.7 1.0 - 2.5 (calc)   Total Bilirubin 0.7 0.2 - 1.2 mg/dL   Alkaline phosphatase (APISO) 59 35 - 144 U/L   AST 21 10 - 35 U/L   ALT 16 9 - 46 U/L  CBC with Differential/Platelet  Result Value Ref Range   WBC 7.2 3.8 - 10.8 Thousand/uL   RBC 4.81 4.20 - 5.80 Million/uL   Hemoglobin 14.8 13.2 - 17.1 g/dL   HCT 42.2 38.5 - 50.0 %   MCV 87.7 80.0 - 100.0 fL   MCH 30.8 27.0 - 33.0 pg   MCHC 35.1 32.0 - 36.0 g/dL   RDW 14.9 11.0 - 15.0 %   Platelets 228 140 - 400 Thousand/uL   MPV 12.4 7.5 - 12.5 fL   Neutro Abs 3,190 1,500 - 7,800 cells/uL   Lymphs Abs 3,067 850 - 3,900 cells/uL   Absolute Monocytes 821 200 - 950 cells/uL   Eosinophils Absolute 79 15 - 500 cells/uL   Basophils Absolute 43 0 - 200 cells/uL   Neutrophils Relative % 44.3 %   Total Lymphocyte 42.6 %   Monocytes Relative 11.4 %   Eosinophils Relative 1.1 %  Basophils Relative 0.6  %  Hemoglobin A1c  Result Value Ref Range   Hgb A1c MFr Bld 5.9 (H) <5.7 % of total Hgb   Mean Plasma Glucose 123 (calc)   eAG (mmol/L) 6.8 (calc)      Assessment & Plan:   Problem List Items Addressed This Visit    Benign hypertension with CKD (chronic kidney disease) stage III (HCC) - Primary    Normal BP currently controlled - Home BP readings none  Complication with CKD-III    Plan:  1. Continue current BP regimen Losartan 50mg  daily, Spironolasctone 25mg , Amlodapine 5mg , Acetabutolol 200mg  2. Encourage improved lifestyle - low sodium diet, regular exercise 3. Continue monitor BP outside office, bring readings to next visit, if persistently >140/90 or new symptoms notify office sooner 4. Follow-up 6 months      CKD (chronic kidney disease), stage III (HCC)    Stable CKD likely secondary to HTN, also possibly with lymphoma s/p chemotherapy Cr baseline 1.4 to 1.5  Plan: 1. Monitor Cr as planned, defer lab today 2. Remain off NSAIDs. Encouraged Tylenol use PRN pain 3. Improve hydration 4. Control BP      Hypothyroidism    Stable, chronic problem (since 2003) s/p radiation for lymphoma  Plan: 1. Continue current dose Levothyroxine 40mcg daily - Future TSH in 6 months      Lymphoma malignant, nodular, lymphocytic (HCC)    Stable, without known recurrence Followed by [facility] Oncology yearly Next follow-up scheduled 08/04/19 - however patient says he wishes to cancel apt, gave him their phone number and asked him to update Oncology and make an informed decision, we can check labs here if need      Pre-diabetes    Stable, will defer A1c today  Plan:  1. Not on any therapy currently  2. Encourage improved lifestyle - low carb, low sugar diet, reduce portion size, continue improving regular exercise as tolerated 3. Follow-up q 6 months       Other Visit Diagnoses    Needs flu shot       Relevant Orders   Flu Vaccine QUAD High Dose(Fluad) (Completed)       No orders of the defined types were placed in this encounter.   Follow up plan: Return in about 6 months (around 01/25/2020) for Yearly Medicare Check up.  Future labs to be ordered for 01/2020 for yearly - including TSH   Nobie Putnam, [facility] 07/28/2019, 11:14 AM

## 2019-07-28 NOTE — Assessment & Plan Note (Signed)
Stable, chronic problem (since 2003) s/p radiation for lymphoma  Plan: 1. Continue current dose Levothyroxine 51mcg daily - Future TSH in 6 months

## 2019-07-28 NOTE — Patient Instructions (Addendum)
Thank you for coming to the office today.  Flu shot today  You may be due for new rx renewal of Acetabutolol and Amlodipine blood pressure pills -check with pharmacy, let me know if you need a new rx.  Call Dr Mike Gip to cancel apt if you prefer  [place] [facility] (Hematology/Oncology) [address] [place], [place] [address] Phone: [phone]  ---------------  Use RICE therapy: - R - Rest / relative rest with activity modification avoid overuse of joint - I - Ice packs (make sure you use a towel or sock / something to protect skin) - C - Compression with flexible Knee Sleeve or ACE wrap to apply pressure and reduce swelling allowing more support - E - Elevation - if significant swelling, lift leg above heart level (toes above your nose) to help reduce swelling, most helpful at night after day of being on your feet   DUE for FASTING BLOOD WORK (no food or drink after midnight before the lab appointment, only water or coffee without cream/sugar on the morning of)  SCHEDULE "Lab Only" visit in the morning at the clinic for lab draw in 6 MONTHS   - Make sure Lab Only appointment is at about 1 week before your next appointment, so that results will be available  For Lab Results, once available within 2-3 days of blood draw, you can can log in to MyChart online to view your results and a brief explanation. Also, we can discuss results at next follow-up visit.   Please schedule a Follow-up Appointment to: Return in about 6 months (around 01/25/2020) for Yearly Medicare Check up.  If you have any other questions or concerns, please feel free to call the office or send a message through [place]. You may also schedule an earlier appointment if necessary.  Additionally, you may be receiving a survey about your experience at our office within a few days to 1 week by e-mail or mail. We value your feedback.  Nobie Putnam, DO [place]

## 2019-08-04 ENCOUNTER — Ambulatory Visit: Payer: Medicare Other | Admitting: Hematology and Oncology

## 2019-08-04 ENCOUNTER — Other Ambulatory Visit: Payer: Medicare Other

## 2019-08-21 ENCOUNTER — Other Ambulatory Visit: Payer: Self-pay | Admitting: Family Medicine

## 2019-08-21 DIAGNOSIS — I10 Essential (primary) hypertension: Secondary | ICD-10-CM

## 2019-08-29 DIAGNOSIS — H401132 Primary open-angle glaucoma, bilateral, moderate stage: Secondary | ICD-10-CM | POA: Diagnosis not present

## 2019-09-07 ENCOUNTER — Other Ambulatory Visit: Payer: Self-pay | Admitting: Family Medicine

## 2019-09-07 DIAGNOSIS — R600 Localized edema: Secondary | ICD-10-CM

## 2019-09-07 DIAGNOSIS — I10 Essential (primary) hypertension: Secondary | ICD-10-CM

## 2019-09-13 ENCOUNTER — Ambulatory Visit (INDEPENDENT_AMBULATORY_CARE_PROVIDER_SITE_OTHER): Payer: Medicare Other | Admitting: Family Medicine

## 2019-09-13 ENCOUNTER — Ambulatory Visit (INDEPENDENT_AMBULATORY_CARE_PROVIDER_SITE_OTHER): Payer: Medicare Other

## 2019-09-13 ENCOUNTER — Other Ambulatory Visit: Payer: Self-pay

## 2019-09-13 ENCOUNTER — Other Ambulatory Visit: Payer: Self-pay | Admitting: Family Medicine

## 2019-09-13 ENCOUNTER — Encounter: Payer: Self-pay | Admitting: Family Medicine

## 2019-09-13 VITALS — BP 103/64 | HR 87 | Temp 98.2°F | Resp 16 | Ht 66.0 in | Wt 154.2 lb

## 2019-09-13 VITALS — BP 103/64 | HR 87 | Temp 98.2°F | Resp 16 | Ht 66.0 in | Wt 154.3 lb

## 2019-09-13 DIAGNOSIS — R42 Dizziness and giddiness: Secondary | ICD-10-CM

## 2019-09-13 DIAGNOSIS — R351 Nocturia: Secondary | ICD-10-CM

## 2019-09-13 DIAGNOSIS — E034 Atrophy of thyroid (acquired): Secondary | ICD-10-CM

## 2019-09-13 DIAGNOSIS — Z0000 Encounter for general adult medical examination without abnormal findings: Secondary | ICD-10-CM

## 2019-09-13 DIAGNOSIS — I129 Hypertensive chronic kidney disease with stage 1 through stage 4 chronic kidney disease, or unspecified chronic kidney disease: Secondary | ICD-10-CM | POA: Diagnosis not present

## 2019-09-13 DIAGNOSIS — R7303 Prediabetes: Secondary | ICD-10-CM

## 2019-09-13 DIAGNOSIS — C8299 Follicular lymphoma, unspecified, extranodal and solid organ sites: Secondary | ICD-10-CM

## 2019-09-13 DIAGNOSIS — N183 Chronic kidney disease, stage 3 unspecified: Secondary | ICD-10-CM

## 2019-09-13 DIAGNOSIS — N1831 Chronic kidney disease, stage 3a: Secondary | ICD-10-CM

## 2019-09-13 DIAGNOSIS — E782 Mixed hyperlipidemia: Secondary | ICD-10-CM

## 2019-09-13 NOTE — Patient Instructions (Addendum)
Thank you for coming to the office today.  Likely low blood pressure when standing temporarily - called Orthostatic Hypotension.  HOLD the Amlodipine 5mg  - stop taking this one for now, after 2-4 weeks if checking BP and we are improved then can remain OFF. If you notice elevated BP >140 then we can consider adding back HALF dose Amlodipine 2.5 (take half a pill of the 5 or let me know and I can order the 2.5mg  dose)  Continue other medications. Including fluid pill.  Keep improving hydration. Caution when standing up quickly before moving. Still can get a little dizzy this is normal.  Notify us or seek care sooner if you pass out or new concerns.  We can do a Virtual Telephone Visit if needed sooner for BP   Please schedule a Follow-up Appointment to: Return in about 6 months (around 03/13/2020) for 6 month follow-up Yearly Medicare Checkup.  If you have any other questions or concerns, please feel free to call the office or send a message through [place]. You may also schedule an earlier appointment if necessary.  Additionally, you may be receiving a survey about your experience at our office within a few days to 1 week by e-mail or mail. We value your feedback.  Nobie Putnam, DO [place]

## 2019-09-13 NOTE — Progress Notes (Signed)
Subjective:   Manuel Reynolds is a 85 y.o. male who presents for Medicare Annual/Subsequent preventive examination.  Review of Systems:   Cardiac Risk Factors include: advanced age (>56men, >69 women);male gender;hypertension;dyslipidemia     Objective:    Vitals: BP 103/64 (BP Location: Right Arm, Cuff Size: Normal)   Pulse 87   Temp 98.2 F (36.8 C) (Oral)   Resp 16   Ht 5\' 6"  (1.676 m)   Wt 154 lb 3.2 oz (69.9 kg)   SpO2 100%   BMI 24.89 kg/m   Body mass index is 24.89 kg/m.  Advanced Directives 09/13/2019 07/13/2018 06/03/2018 06/16/2017 06/01/2017 05/26/2016 05/24/2015  Does Patient Have a Medical Advance Directive? Yes Yes Yes Yes Yes Yes Yes  Type of Advance Directive Living will;Healthcare Power of Attorney Living will;Healthcare Power of [place];Living will - - -  Copy of [place] in Chart? No - copy requested No - copy requested - No - copy requested - - -    Tobacco Social History   Tobacco Use  Smoking Status Never Smoker  Smokeless Tobacco Never Used     Counseling given: Not Answered   Clinical Intake:  Pre-visit preparation completed: Yes  Pain : 0-10 Pain Score: 1  Pain Type: Acute pain Pain Location: Head Pain Descriptors / Indicators: Aching     Nutritional Risks: None Diabetes: No  How often do you need to have someone help you when you read instructions, pamphlets, or other written materials from your doctor or pharmacy?: 1 - Never  Interpreter Needed?: No  Information entered by :: Tiffany Hill,LPN  Past Medical History:  Diagnosis Date  . Anxiety   . Hypertension   . 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 Thyroid disease    Past Surgical History:  Procedure Laterality Date  . EYE SURGERY  2010   cataract   Family History  Problem Relation Age of Onset  . Hypertension Father   . Hypertension Mother   . Lung cancer Sister   . Diabetes Maternal Aunt   .  Diabetes Maternal Uncle   . Diabetes Maternal Aunt   . Diabetes Maternal Uncle    Social History   Socioeconomic History  . Marital status: Married    Spouse name: Not on file  . Number of children: Not on file  . Years of education: Not on file  . Highest education level: Master's degree (e.g., MA, MS, MEng, MEd, MSW, MBA)  Occupational History  . Occupation: retired  Scientific laboratory technician  . Financial resource strain: Not hard at all  . Food insecurity    Worry: Never true    Inability: Never true  . Transportation needs    Medical: No    Non-medical: No  Tobacco Use  . Smoking status: Never Smoker  . Smokeless tobacco: Never Used  Substance and Sexual Activity  . Alcohol use: No  . Drug use: No  . Sexual activity: Not on file  Lifestyle  . Physical activity    Days per week: 2 days    Minutes per session: 20 min  . Stress: Not at all  Relationships  . Social connections    Talks on phone: More than three times a week    Gets together: Once a week    Attends religious service: 1 to 4 times per year    Active member of club or organization: Yes    Attends meetings of clubs or organizations: 1  to 4 times per year    Relationship status: Married  Other Topics Concern  . Not on file  Social History Narrative  . Not on file    Outpatient Encounter Medications as of 09/13/2019  Medication Sig  . acebutolol (SECTRAL) 200 MG capsule TAKE 1 CAPSULE(200 MG) BY MOUTH DAILY  . amLODipine (NORVASC) 5 MG tablet TAKE 1 TABLET(5 MG) BY MOUTH DAILY  . latanoprost (XALATAN) 0.005 % ophthalmic solution 1 drop at bedtime.  Marland Kitchen levothyroxine (SYNTHROID) 50 MCG tablet TAKE 1 TABLET(50 MCG) BY MOUTH DAILY BEFORE BREAKFAST  . losartan (COZAAR) 50 MG tablet TAKE 1 TABLET(50 MG) BY MOUTH DAILY  . Probiotic Product (PROBIOTIC-10 PO) Take by mouth.  . simvastatin (ZOCOR) 40 MG tablet TAKE 1 TABLET(40 MG) BY MOUTH DAILY  . spironolactone (ALDACTONE) 25 MG tablet TAKE 1 TABLET(25 MG) BY MOUTH DAILY   . VIAGRA 100 MG tablet TAKE 1/2 TABLET BY MOUTH AS NEEDED FOR ERECTILE DYSFUNCTION  . VITAMIN D PO Take by mouth.   No facility-administered encounter medications on file as of 09/13/2019.     Activities of Daily Living In your present state of health, do you have any difficulty performing the following activities: 09/13/2019  Hearing? Y  Comment hearing aids  Vision? N  Comment eyeglasses, [facility]  Difficulty concentrating or making decisions? Y  Comment comes back  Walking or climbing stairs? N  Dressing or bathing? N  Doing errands, shopping? Y  Comment wife drives  Conservation officer, nature and eating ? N  Using the Toilet? N  In the past six months, have you accidently leaked urine? N  Do you have problems with loss of bowel control? N  Managing your Medications? N  Managing your Finances? N  Housekeeping or managing your Housekeeping? N  Some recent data might be hidden    Patient Care Team: [name], DO as PCP - General (Family Medicine) Oneta Rack, MD (Dermatology) Lequita Asal, MD as Referring Physician (Hematology and Oncology) Edrick Kins, DPM as Consulting Physician (Podiatry) Leandrew Koyanagi, MD as Referring Physician (Ophthalmology)   Assessment:   This is a routine wellness examination for Manuel Reynolds.  Exercise Activities and Dietary recommendations Current Exercise Habits: Home exercise routine, Type of exercise: walking, Time (Minutes): 15, Frequency (Times/Week): 2, Weekly Exercise (Minutes/Week): 30, Intensity: Mild, Exercise limited by: None identified  Goals    . Increase water intake     Recommend drinking at least 6-8 glasses of water a day        Fall Risk: Fall Risk  09/13/2019 01/17/2019 07/13/2018 06/16/2017 11/11/2016  Falls in the past year? 0 0 No Yes No  Number falls in past yr: 0 0 - 1 -  Injury with Fall? 0 0 - No -  Follow up - - - Falls prevention discussed -    FALL RISK PREVENTION PERTAINING  TO THE HOME:  Any stairs in or around the home? Yes  If so, are there any without handrails? No   Home free of loose throw rugs in walkways, pet beds, electrical cords, etc? Yes  Adequate lighting in your home to reduce risk of falls? Yes   ASSISTIVE DEVICES UTILIZED TO PREVENT FALLS:  Life alert? No  Use of a cane, walker or w/c? No  Grab bars in the bathroom? No  Shower chair or bench in shower? No  Elevated toilet seat or a handicapped toilet? Yes   TIMED UP AND GO:  Was the test performed?  Yes .  Length of time to ambulate 10 feet: 11 sec.   GAIT:  Appearance of gait: Gait steady and fast without the use of an assistive device.  Education: Fall risk prevention has been discussed.  Intervention(s) required? No  DME/home health order needed?  No   Depression Screen PHQ 2/9 Scores 09/13/2019 07/28/2019 01/17/2019 01/17/2019  PHQ - 2 Score 0 0 0 0  PHQ- 9 Score - - - 0    Cognitive Function     6CIT Screen 09/13/2019 07/13/2018 06/16/2017  What Year? 0 points 0 points 0 points  What month? 0 points 0 points 0 points  What time? 0 points 0 points 0 points  Count back from 20 0 points 0 points 0 points  Months in reverse 0 points 0 points 0 points  Repeat phrase 0 points 0 points 0 points  Total Score 0 0 0    Immunization History  Administered Date(s) Administered  . Fluad Quad(high Dose 65+) 07/28/2019  . Influenza, High Dose Seasonal PF 08/17/2015, 09/02/2018  . Influenza-Unspecified 08/18/2016  . Pneumococcal Conjugate-13 10/08/2007  . Pneumococcal Polysaccharide-23 11/03/2013  . Zoster 11/03/2013    Qualifies for Shingles Vaccine? Yes  Zostavax completed 2014. Due for Shingrix. Education has been provided regarding the importance of this vaccine. Pt has been advised to call insurance company to determine out of pocket expense. Advised may also receive vaccine at local pharmacy or Health Dept. Verbalized acceptance and understanding.  Tdap:up to date   Flu  Vaccine: up to date   Pneumococcal Vaccine: up to date   Screening Tests Health Maintenance  Topic Date Due  . TETANUS/TDAP  09/16/2025  . INFLUENZA VACCINE  Completed  . PNA vac Low Risk Adult  Completed   Cancer Screenings:  Colorectal Screening: no longer required  Lung Cancer Screening: (Low Dose CT Chest recommended if Age 66-80 years, 30 pack-year currently smoking OR have quit w/in 15years.) does not qualify.     Additional Screening:  Hepatitis C Screening: does not qualify  Vision Screening: Recommended annual ophthalmology exams for early detection of glaucoma and other disorders of the eye. Is the patient up to date with their annual eye exam?  Yes  Who is the provider or what is the name of the office in which the pt attends annual eye exams? [facility]   Dental Screening: Recommended annual dental exams for proper oral hygiene  Community Resource Referral:  CRR required this visit?  No        Plan:  I have personally reviewed and addressed the Medicare Annual Wellness questionnaire and have noted the following in the patient's chart:  A. Medical and social history B. Use of alcohol, tobacco or illicit drugs  C. Current medications and supplements D. Functional ability and status E.  Nutritional status F.  Physical activity G. Advance directives H. List of other physicians I.  Hospitalizations, surgeries, and ER visits in previous 12 months J.  [place] such as hearing and vision if needed, cognitive and depression L. Referrals and appointments   In addition, I have reviewed and discussed with patient certain preventive protocols, quality metrics, and best practice recommendations. A written personalized care plan for preventive services as well as general preventive health recommendations were provided to patient.   Signed,   Bevelyn Ngo, LPN  [id number] Nurse Health Advisor   Nurse Notes:  Patient complaining of some dizziness  and queasiness when standing, states it does not happen every time. He  does complain of a headache today as well. Checked BP sitting: 121/70, standing: 103/64. Patient did not complain of dizziness at the time of standing for BP today. Discussed with Dr.Karamalegos,  Patient going to be seen by PCP today while in office.

## 2019-09-13 NOTE — Assessment & Plan Note (Signed)
Lower BP today, with episodic orthostatic dizziness without significant orthostatic hypotension at this time or any syncopal event. Poor hydration lately. - Home BP readings Complication with CKD-III    Plan:  1. HOLD / DC - Amlodipine 5mg  daily for now - trial on monitor BP 1-2 weeks, notify us if elevated >140 consistently or if symptoms are concerning, if need to restart can do HALF dose for Amlodipine 2.5mg  daily trial (cut tab in half or we can order new pill) - Otherwise we will continue rest of current BP regimen Losartan 50mg  daily, Spironolasctone 25mg , Acetabutolol 200mg  2. Encourage improved lifestyle - low sodium diet, regular exercise 3. Continue monitor BP outside office, bring readings to next visit, if persistently >140/90 or new symptoms notify office sooner  F/u 6 months as planned, sooner if need

## 2019-09-13 NOTE — Patient Instructions (Addendum)
[name] , Thank you for taking time to come for your Medicare Wellness Visit. I appreciate your ongoing commitment to your health goals. Please review the following plan we discussed and let me know if I can assist you in the future.   Screening recommendations/referrals: Colonoscopy: no longer required Recommended yearly ophthalmology/optometry visit for glaucoma screening and checkup Recommended yearly dental visit for hygiene and checkup  Vaccinations: Influenza vaccine: up to date Pneumococcal vaccine: up to date Tdap vaccine: up to date Shingles vaccine: shingrix eligible     Advanced directives: Please bring a copy of your health care power of attorney and living will to the office at your convenience.  Conditions/risks identified:  Low blood pressures, please keep an eye on your bp readings at home.   Next appointment: follow up in one year for your annual wellness visit   Preventive Care 83 Years and Older, Male Preventive care refers to lifestyle choices and visits with your health care provider that can promote health and wellness. What does preventive care include?  A yearly physical exam. This is also called an annual well check.  Dental exams once or twice a year.  Routine eye exams. Ask your health care provider how often you should have your eyes checked.  Personal lifestyle choices, including:  Daily care of your teeth and gums.  Regular physical activity.  Eating a healthy diet.  Avoiding tobacco and drug use.  Limiting alcohol use.  Practicing safe sex.  Taking low doses of aspirin every day.  Taking vitamin and mineral supplements as recommended by your health care provider. What happens during an annual well check? The services and screenings done by your health care provider during your annual well check will depend on your age, overall health, lifestyle risk factors, and family history of disease. Counseling  Your health care provider may  ask you questions about your:  Alcohol use.  Tobacco use.  Drug use.  Emotional well-being.  Home and relationship well-being.  Sexual activity.  Eating habits.  History of falls.  Memory and ability to understand (cognition).  Work and work Statistician. Screening  You may have the following tests or measurements:  Height, weight, and BMI.  Blood pressure.  Lipid and cholesterol levels. These may be checked every 5 years, or more frequently if you are over 56 years old.  Skin check.  Lung cancer screening. You may have this screening every year starting at age 57 if you have a 30-pack-year history of smoking and currently smoke or have quit within the past 15 years.  Fecal occult blood test (FOBT) of the stool. You may have this test every year starting at age 67.  Flexible sigmoidoscopy or colonoscopy. You may have a sigmoidoscopy every 5 years or a colonoscopy every 10 years starting at age 24.  Prostate cancer screening. Recommendations will vary depending on your family history and other risks.  Hepatitis C blood test.  Hepatitis B blood test.  Sexually transmitted disease (STD) testing.  Diabetes screening. This is done by checking your blood sugar (glucose) after you have not eaten for a while (fasting). You may have this done every 1-3 years.  Abdominal aortic aneurysm (AAA) screening. You may need this if you are a current or former smoker.  Osteoporosis. You may be screened starting at age 79 if you are at high risk. Talk with your health care provider about your test results, treatment options, and if necessary, the need for more tests. Vaccines  Your  health care provider may recommend certain vaccines, such as:  Influenza vaccine. This is recommended every year.  Tetanus, diphtheria, and acellular pertussis (Tdap, Td) vaccine. You may need a Td booster every 10 years.  Zoster vaccine. You may need this after age 40.  Pneumococcal 13-valent  conjugate (PCV13) vaccine. One dose is recommended after age 41.  Pneumococcal polysaccharide (PPSV23) vaccine. One dose is recommended after age 13. Talk to your health care provider about which screenings and vaccines you need and how often you need them. This information is not intended to replace advice given to you by your health care provider. Make sure you discuss any questions you have with your health care provider. Document Released: 12/07/2015 Document Revised: 07/30/2016 Document Reviewed: 09/11/2015 Elsevier Interactive Patient Education  2017 [place] Prevention in the Home Falls can cause injuries. They can happen to people of all ages. There are many things you can do to make your home safe and to help prevent falls. What can I do on the outside of my home?  Regularly fix the edges of walkways and driveways and fix any cracks.  Remove anything that might make you trip as you walk through a door, such as a raised step or threshold.  Trim any bushes or trees on the path to your home.  Use bright outdoor lighting.  Clear any walking paths of anything that might make someone trip, such as rocks or tools.  Regularly check to see if handrails are loose or broken. Make sure that both sides of any steps have handrails.  Any raised decks and porches should have guardrails on the edges.  Have any leaves, snow, or ice cleared regularly.  Use sand or salt on walking paths during winter.  Clean up any spills in your garage right away. This includes oil or grease spills. What can I do in the bathroom?  Use night lights.  Install grab bars by the toilet and in the tub and shower. Do not use towel bars as grab bars.  Use non-skid mats or decals in the tub or shower.  If you need to sit down in the shower, use a plastic, non-slip stool.  Keep the floor dry. Clean up any water that spills on the floor as soon as it happens.  Remove soap buildup in the tub or  shower regularly.  Attach bath mats securely with double-sided non-slip rug tape.  Do not have throw rugs and other things on the floor that can make you trip. What can I do in the bedroom?  Use night lights.  Make sure that you have a light by your bed that is easy to reach.  Do not use any sheets or blankets that are too big for your bed. They should not hang down onto the floor.  Have a firm chair that has side arms. You can use this for support while you get dressed.  Do not have throw rugs and other things on the floor that can make you trip. What can I do in the kitchen?  Clean up any spills right away.  Avoid walking on wet floors.  Keep items that you use a lot in easy-to-reach places.  If you need to reach something above you, use a strong step stool that has a grab bar.  Keep electrical cords out of the way.  Do not use floor polish or wax that makes floors slippery. If you must use wax, use non-skid floor wax.  Do not  have throw rugs and other things on the floor that can make you trip. What can I do with my stairs?  Do not leave any items on the stairs.  Make sure that there are handrails on both sides of the stairs and use them. Fix handrails that are broken or loose. Make sure that handrails are as long as the stairways.  Check any carpeting to make sure that it is firmly attached to the stairs. Fix any carpet that is loose or worn.  Avoid having throw rugs at the top or bottom of the stairs. If you do have throw rugs, attach them to the floor with carpet tape.  Make sure that you have a light switch at the top of the stairs and the bottom of the stairs. If you do not have them, ask someone to add them for you. What else can I do to help prevent falls?  Wear shoes that:  Do not have high heels.  Have rubber bottoms.  Are comfortable and fit you well.  Are closed at the toe. Do not wear sandals.  If you use a stepladder:  Make sure that it is fully  opened. Do not climb a closed stepladder.  Make sure that both sides of the stepladder are locked into place.  Ask someone to hold it for you, if possible.  Clearly mark and make sure that you can see:  Any grab bars or handrails.  First and last steps.  Where the edge of each step is.  Use tools that help you move around (mobility aids) if they are needed. These include:  Canes.  Walkers.  Scooters.  Crutches.  Turn on the lights when you go into a dark area. Replace any light bulbs as soon as they burn out.  Set up your furniture so you have a clear path. Avoid moving your furniture around.  If any of your floors are uneven, fix them.  If there are any pets around you, be aware of where they are.  Review your medicines with your doctor. Some medicines can make you feel dizzy. This can increase your chance of falling. Ask your doctor what other things that you can do to help prevent falls. This information is not intended to replace advice given to you by your health care provider. Make sure you discuss any questions you have with your health care provider. Document Released: 09/06/2009 Document Revised: 04/17/2016 Document Reviewed: 12/15/2014 Elsevier Interactive Patient Education  2017 [employer].

## 2019-09-13 NOTE — Progress Notes (Addendum)
Subjective:    Patient ID: Manuel Reynolds, male    DOB: 1930-06-04, 85 y.o.   MRN: [id number]  Manuel Reynolds is a 85 y.o. male presenting on 09/13/2019 for Dizziness   HPI   Patient has already seen [facility] LPN for AMW today.  Orthostatic Dizziness vs Hypotension / Chronic HTN w/ CKD-III Today patient was added on to be seen for a persistent dizziness complaint, and he does get dizzy when standing suddenly. - he describes problem onset 1-2 month ago, with some increased dizziness or wooziness when stand up, usually if pause or wait a few second or minute it resolves then he is okay to ambulate. He has not had any significant fall or injury from this lately. - Previous history of chronic dizziness. Lately he has not complained about it as much lately - He does not drink as much water lately - he is on multiple BP medications - Acebutol 200mg , Losartan 50mg  daily, Amlodipine 5mg  daily, Spironolactone 25mg  daily - Admits edema occasionally Denies any persistent or chronic lightheaded or dizziness, or room spinning vertigo, headache, loss of vision, near or syncopal event, chest pain, dyspnea, cough fever   Health Maintenance: Already UTD Flu vaccine.  Depression screen [facility] 2/9 09/13/2019 07/28/2019 01/17/2019  Decreased Interest 0 0 0  Down, Depressed, Hopeless 0 0 0  PHQ - 2 Score 0 0 0  Altered sleeping - - -  Tired, decreased energy - - -  Change in appetite - - -  Feeling bad or failure about yourself  - - -  Trouble concentrating - - -  Moving slowly or fidgety/restless - - -  Suicidal thoughts - - -  PHQ-9 Score - - -    Social History   Tobacco Use  . Smoking status: Never Smoker  . Smokeless tobacco: Never Used  Substance Use Topics  . Alcohol use: No  . Drug use: No    Review of Systems Per HPI unless specifically indicated above     Objective:      No data found.  BP 103/64   Pulse 87   Temp 98.2 F (36.8 C) (Oral)   Resp 16   Ht 5\' 6"  (1.676 m)    Wt 154 lb 4.8 oz (70 kg)   SpO2 100%   BMI 24.90 kg/m   Wt Readings from Last 3 Encounters:  09/13/19 154 lb 4.8 oz (70 kg)  09/13/19 154 lb 3.2 oz (69.9 kg)  07/28/19 159 lb (72.1 kg)    Physical Exam Vitals signs and nursing note reviewed.  Constitutional:      General: He is not in acute distress.    Appearance: He is well-developed. He is not diaphoretic.     Comments: Well-appearing, comfortable, cooperative  HENT:     Head: Normocephalic and atraumatic.  Eyes:     General:        Right eye: No discharge.        Left eye: No discharge.     Conjunctiva/sclera: Conjunctivae normal.  Neck:     Musculoskeletal: Normal range of motion and neck supple.     Thyroid: No thyromegaly.  Cardiovascular:     Rate and Rhythm: Normal rate and regular rhythm.     Heart sounds: Normal heart sounds. No murmur.  Pulmonary:     Effort: Pulmonary effort is normal. No respiratory distress.     Breath sounds: Normal breath sounds. No wheezing or rales.  Musculoskeletal: Normal range of motion.  Lymphadenopathy:  Cervical: No cervical adenopathy.  Skin:    General: Skin is warm and dry.     Findings: No erythema or rash.  Neurological:     Mental Status: He is alert and oriented to person, place, and time.  Psychiatric:        Behavior: Behavior normal.     Comments: Well groomed, good eye contact, normal speech and thoughts        Results for orders placed or performed in visit on 01/07/19  T4, free  Result Value Ref Range   Free T4 1.2 0.8 - 1.8 ng/dL  TSH  Result Value Ref Range   TSH 4.87 (H) 0.40 - 4.50 mIU/L  Lipid panel  Result Value Ref Range   Cholesterol 157 <200 mg/dL   HDL 33 (L) > OR = 40 mg/dL   Triglycerides 109 <150 mg/dL   LDL Cholesterol (Calc) 103 (H) mg/dL (calc)   Total CHOL/HDL Ratio 4.8 <5.0 (calc)   Non-HDL Cholesterol (Calc) 124 <130 mg/dL (calc)  COMPLETE METABOLIC PANEL WITH GFR  Result Value Ref Range   Glucose, Bld 90 65 - 99 mg/dL   BUN  30 (H) 7 - 25 mg/dL   Creat 1.45 (H) 0.70 - 1.11 mg/dL   GFR, Est Non African American 43 (L) > OR = 60 mL/min/1.69m2   GFR, Est African American 50 (L) > OR = 60 mL/min/1.29m2   BUN/Creatinine Ratio 21 6 - 22 (calc)   Sodium 138 135 - 146 mmol/L   Potassium 4.4 3.5 - 5.3 mmol/L   Chloride 106 98 - 110 mmol/L   CO2 23 20 - 32 mmol/L   Calcium 9.4 8.6 - 10.3 mg/dL   Total Protein 6.4 6.1 - 8.1 g/dL   Albumin 4.0 3.6 - 5.1 g/dL   Globulin 2.4 1.9 - 3.7 g/dL (calc)   AG Ratio 1.7 1.0 - 2.5 (calc)   Total Bilirubin 0.7 0.2 - 1.2 mg/dL   Alkaline phosphatase (APISO) 59 35 - 144 U/L   AST 21 10 - 35 U/L   ALT 16 9 - 46 U/L  CBC with Differential/Platelet  Result Value Ref Range   WBC 7.2 3.8 - 10.8 Thousand/uL   RBC 4.81 4.20 - 5.80 Million/uL   Hemoglobin 14.8 13.2 - 17.1 g/dL   HCT 42.2 38.5 - 50.0 %   MCV 87.7 80.0 - 100.0 fL   MCH 30.8 27.0 - 33.0 pg   MCHC 35.1 32.0 - 36.0 g/dL   RDW 14.9 11.0 - 15.0 %   Platelets 228 140 - 400 Thousand/uL   MPV 12.4 7.5 - 12.5 fL   Neutro Abs 3,190 1,500 - 7,800 cells/uL   Lymphs Abs 3,067 850 - 3,900 cells/uL   Absolute Monocytes 821 200 - 950 cells/uL   Eosinophils Absolute 79 15 - 500 cells/uL   Basophils Absolute 43 0 - 200 cells/uL   Neutrophils Relative % 44.3 %   Total Lymphocyte 42.6 %   Monocytes Relative 11.4 %   Eosinophils Relative 1.1 %   Basophils Relative 0.6 %  Hemoglobin A1c  Result Value Ref Range   Hgb A1c MFr Bld 5.9 (H) <5.7 % of total Hgb   Mean Plasma Glucose 123 (calc)   eAG (mmol/L) 6.8 (calc)      Assessment & Plan:   Problem List Items Addressed This Visit    Benign hypertension with CKD (chronic kidney disease) stage III    Lower BP today, with episodic orthostatic dizziness without significant orthostatic hypotension  at this time or any syncopal event. Poor hydration lately. - Home BP readings Complication with CKD-III    Plan:  1. HOLD / DC - Amlodipine 5mg  daily for now - trial on monitor BP 1-2  weeks, notify us if elevated >140 consistently or if symptoms are concerning, if need to restart can do HALF dose for Amlodipine 2.5mg  daily trial (cut tab in half or we can order new pill) - Otherwise we will continue rest of current BP regimen Losartan 50mg  daily, Spironolasctone 25mg , Acetabutolol 200mg  2. Encourage improved lifestyle - low sodium diet, regular exercise 3. Continue monitor BP outside office, bring readings to next visit, if persistently >140/90 or new symptoms notify office sooner  F/u 6 months as planned, sooner if need       Other Visit Diagnoses    Orthostatic dizziness    -  Primary     Documented : BP readings sitting: 121/70, standing: 103/64  See A&P Hypertension   No orders of the defined types were placed in this encounter.    Follow up plan: Return in about 6 months (around 03/13/2020) for 6 month follow-up Yearly Medicare Checkup.  Future labs ordered for 03/06/20   Nobie Putnam, [facility] 09/13/2019, 11:28 AM

## 2019-09-14 DIAGNOSIS — X32XXXA Exposure to sunlight, initial encounter: Secondary | ICD-10-CM | POA: Diagnosis not present

## 2019-09-14 DIAGNOSIS — Z85828 Personal history of other malignant neoplasm of skin: Secondary | ICD-10-CM | POA: Diagnosis not present

## 2019-09-14 DIAGNOSIS — L821 Other seborrheic keratosis: Secondary | ICD-10-CM | POA: Diagnosis not present

## 2019-09-14 DIAGNOSIS — Z08 Encounter for follow-up examination after completed treatment for malignant neoplasm: Secondary | ICD-10-CM | POA: Diagnosis not present

## 2019-09-14 DIAGNOSIS — L570 Actinic keratosis: Secondary | ICD-10-CM | POA: Diagnosis not present

## 2019-09-27 DIAGNOSIS — H401132 Primary open-angle glaucoma, bilateral, moderate stage: Secondary | ICD-10-CM | POA: Diagnosis not present

## 2019-09-29 ENCOUNTER — Other Ambulatory Visit: Payer: Self-pay | Admitting: Family Medicine

## 2019-09-29 DIAGNOSIS — I10 Essential (primary) hypertension: Secondary | ICD-10-CM

## 2019-10-17 ENCOUNTER — Telehealth: Payer: Self-pay | Admitting: Family Medicine

## 2019-10-17 NOTE — Chronic Care Management (AMB) (Signed)
Chronic Care Management   Note  10/17/2019 Name: Manuel Reynolds MRN: [id number] DOB: 01/19/1930  Manuel Reynolds is a 85 y.o. year old male who is a primary care patient of [name], DO. I reached out to Belva Crome by phone today in response to a referral sent by [name] health plan.     Manuel Reynolds was given information about Chronic Care Management services today including:  1. CCM service includes personalized support from designated clinical staff supervised by his physician, including individualized plan of care and coordination with other care providers 2. 24/7 contact phone numbers for assistance for urgent and routine care needs. 3. Service will only be billed when office clinical staff spend 20 minutes or more in a month to coordinate care. 4. Only one practitioner may furnish and bill the service in a calendar month. 5. The patient may stop CCM services at any time (effective at the end of the month) by phone call to the office staff. 6. The patient will be responsible for cost sharing (co-pay) of up to 20% of the service fee (after annual deductible is met).  Patients wife [name] did not agree to services and wishes to discuss CCM services with PCP first before deciding about enrollment in care management services.   Follow up plan: The care management team is available to follow up with the patient after provider conversation with the patient regarding recommendation for care management engagement and subsequent re-referral to the care management team.   [place], [place], [place] [address] Direct Dial: [email or web address]  Website: [email or web address]

## 2019-11-30 ENCOUNTER — Ambulatory Visit (INDEPENDENT_AMBULATORY_CARE_PROVIDER_SITE_OTHER): Payer: Medicare Other | Admitting: Family Medicine

## 2019-11-30 ENCOUNTER — Other Ambulatory Visit: Payer: Self-pay

## 2019-11-30 ENCOUNTER — Encounter: Payer: Self-pay | Admitting: Family Medicine

## 2019-11-30 VITALS — BP 150/83 | HR 69 | Temp 98.0°F | Resp 16 | Ht 66.0 in | Wt 155.6 lb

## 2019-11-30 DIAGNOSIS — I129 Hypertensive chronic kidney disease with stage 1 through stage 4 chronic kidney disease, or unspecified chronic kidney disease: Secondary | ICD-10-CM

## 2019-11-30 DIAGNOSIS — E039 Hypothyroidism, unspecified: Secondary | ICD-10-CM | POA: Diagnosis not present

## 2019-11-30 DIAGNOSIS — R410 Disorientation, unspecified: Secondary | ICD-10-CM | POA: Diagnosis not present

## 2019-11-30 DIAGNOSIS — G4709 Other insomnia: Secondary | ICD-10-CM

## 2019-11-30 DIAGNOSIS — N183 Chronic kidney disease, stage 3 unspecified: Secondary | ICD-10-CM

## 2019-11-30 LAB — POCT URINALYSIS DIPSTICK
Bilirubin, UA: NEGATIVE
Blood, UA: NEGATIVE
Glucose, UA: NEGATIVE
Ketones, UA: NEGATIVE
Leukocytes, UA: NEGATIVE
Nitrite, UA: NEGATIVE
Protein, UA: NEGATIVE
Spec Grav, UA: 1.01 (ref 1.010–1.025)
Urobilinogen, UA: 0.2 E.U./dL
pH, UA: 5 (ref 5.0–8.0)

## 2019-11-30 MED ORDER — TRAZODONE HCL 50 MG PO TABS: 25.0000 mg | ORAL_TABLET | Freq: Every day | ORAL | 2 refills | Status: DC

## 2019-11-30 NOTE — Progress Notes (Signed)
Subjective:    Patient ID: Manuel Reynolds, male    DOB: [birth year], 86 y.o.   MRN: [id number]  Manuel Reynolds is a 86 y.o. male presenting on 11/30/2019 for Hallucinations (onset 3 days)  History provided by both patient and spouse, [name]  HPI   Hallucinations / Paranoid / Insomnia / Memory Issues / Possible UTI Reports symptoms started back in November 09/2019 with an issue of severe nose bleed, they called rescue squad and this caused some stress to him at that time. He had elevated BP at that time. He has had improved since then with normal and low normal BP. - Recently past few days he has had issues with difficulty with abnormal perceptions and hallucinations, seems to be mostly at home, can be daytime, and also at night does have some sundowning episodes. - He has difficulty with insomnia primarily today is a concern, he is not sleeping well and it is affecting him more with these symptoms - He has prior history of night-time hypopompic hallucinations in past - He has tried Benadryl occasionally - He lives at home with his wife who is primary caregiver for him, he is able to still do many ADLs but does benefit from her assistance. There have been no reports of aggressive or other abnormal behavior or any risk of harm to himself or others. Admits difficulty with vision, has chronic poor vision, and uses glasses often has problem with vision especially in distance. Admits poor hydration limited water intake. He has good appetite though for meals.  Denies dysuria, urinary frequency, hematuria, fever, chills, cough, short of breath, abdominal pain, nausea vomiting   Depression screen [facility] 2/9 09/13/2019 07/28/2019 01/17/2019  Decreased Interest 0 0 0  Down, Depressed, Hopeless 0 0 0  PHQ - 2 Score 0 0 0  Altered sleeping - - -  Tired, decreased energy - - -  Change in appetite - - -  Feeling bad or failure about yourself  - - -  Trouble concentrating - - -  Moving slowly or  fidgety/restless - - -  Suicidal thoughts - - -  PHQ-9 Score - - -    Social History   Tobacco Use  . Smoking status: Never Smoker  . Smokeless tobacco: Never Used  Substance Use Topics  . Alcohol use: No  . Drug use: No    Review of Systems Per HPI unless specifically indicated above     Objective:    BP (!) 150/83   Pulse 69   Temp 98 F (36.7 C) (Oral)   Resp 16   Ht 5\' 6"  (1.676 m)   Wt 155 lb 9.6 oz (70.6 kg)   BMI 25.11 kg/m   Wt Readings from Last 3 Encounters:  11/30/19 155 lb 9.6 oz (70.6 kg)  09/13/19 154 lb 4.8 oz (70 kg)  09/13/19 154 lb 3.2 oz (69.9 kg)    Physical Exam Vitals and nursing note reviewed.  Constitutional:      General: He is not in acute distress.    Appearance: He is well-developed. He is not diaphoretic.     Comments: Chronically ill but mostly well 86 year old elderly male, comfortable, cooperative, thin appearance  HENT:     Head: Normocephalic and atraumatic.  Eyes:     General:        Right eye: No discharge.        Left eye: No discharge.     Conjunctiva/sclera: Conjunctivae normal.  Neck:  Thyroid: No thyromegaly.  Cardiovascular:     Rate and Rhythm: Normal rate and regular rhythm.     Heart sounds: Normal heart sounds. No murmur.  Pulmonary:     Effort: Pulmonary effort is normal. No respiratory distress.     Breath sounds: Normal breath sounds. No wheezing or rales.  Musculoskeletal:        General: Normal range of motion.     Cervical back: Normal range of motion and neck supple.  Lymphadenopathy:     Cervical: No cervical adenopathy.  Skin:    General: Skin is warm and dry.     Findings: No erythema or rash.  Neurological:     Mental Status: He is alert and oriented to person, place, and time.  Psychiatric:        Behavior: Behavior normal.     Comments: Well groomed, good eye contact, normal speech and thoughts.       Results for orders placed or performed in visit on 11/30/19  POCT Urinalysis  Dipstick  Result Value Ref Range   Color, UA yellow    Clarity, UA clear    Glucose, UA Negative Negative   Bilirubin, UA Negative    Ketones, UA Negative    Spec Grav, UA 1.010 1.010 - 1.025   Blood, UA Negative    pH, UA 5.0 5.0 - 8.0   Protein, UA Negative Negative   Urobilinogen, UA 0.2 0.2 or 1.0 E.U./dL   Nitrite, UA Negative    Leukocytes, UA Negative Negative   Appearance clear    Odor        Assessment & Plan:   Problem List Items Addressed This Visit    Hypothyroidism   Relevant Orders   [facility] - TSH   [facility] - T4, free   Benign hypertension with CKD (chronic kidney disease) stage III   Relevant Orders   [facility] - CMET w/ GFR CMP Complete Metabolic Panel physical   [facility] - CBC with Differential/Platelet physical    Other Visit Diagnoses    Confusion    -  Primary   Relevant Orders   POCT Urinalysis Dipstick (Completed)   [facility] - CMET w/ GFR CMP Complete Metabolic Panel physical   [facility] - CBC with Differential/Platelet physical   [facility] - TSH   [facility] - T4, free   Other insomnia       Relevant Medications   traZODone (DESYREL) 50 MG tablet     #Insomnia / Hallucinations  Clinically uncertain exact cause of this new gradual problem with episodic hallucinations, seems to be non threatening and not causing any safety concerns. He does experience them both day and night, not every day. No other features or behavioral issues. There is some gradual cognitive decline overall, but not consistent with an advanced dementia at this time.   Will check lab work up today CMET CBC Thyroid panel  It seems primary issue is related to insomnia, as some of these hallucinations were hypnopompic and not a new problem.  Start Trazodone 50mg  tabs - start half tab 25mg  nightly then can adjust to 50mg  if need, goal to improve sleep cycle, limit some of these symptoms.  No obvious other causes to trigger - No sign of infection. Not consistent with UTI but we can check urine while he is here as  well today. Not exhibiting delirium or disorientation. Seems less likely based on history. - UA is negative - Pending Urine Culture  No particular medication causing it as side  effect. I suspected Benadryl is not helping when they take it, also he had a rare dose of Diazepam again likely did not cause it, he has history of BDZ medication in past but has been off for years. I have asked them to discontinue these medicines now.  Likely other factors would be reduced hearing and vision affecting these problems and perception.  Follow-up progress Future may need additional support, care management / home health / palliative in future    Meds ordered this encounter  Medications  . traZODone (DESYREL) 50 MG tablet    Sig: Take 0.5-1 tablets (25-50 mg total) by mouth at bedtime.    Dispense:  30 tablet    Refill:  2    Follow up plan: Return in about 4 weeks (around 12/28/2019) for Insomnia / Hallucinations.    Nobie Putnam, [facility] 11/30/2019, 1:46 PM

## 2019-11-30 NOTE — Patient Instructions (Addendum)
Thank you for coming to the office today.  Stay tuned for lab results and urine culture result  Ordered Trazodone for nightly to help sleep, can use half pill at first then increase after 1 week if tolerated, can help restore sleep cycle, may need to adjust in future, can consider as needed only.  Likely more related to mind or early or advancing dementia, does not seem consistent with acute serious delirium  If severe worsening or new concerns, can seek care at hospital ED for further prompt testing, if not improving but not worsening we can refer to Neurology  Please schedule a Follow-up Appointment to: Return in about 4 weeks (around 12/28/2019) for Insomnia / Hallucinations.  If you have any other questions or concerns, please feel free to call the office or send a message through [place]. You may also schedule an earlier appointment if necessary.  Additionally, you may be receiving a survey about your experience at our office within a few days to 1 week by e-mail or mail. We value your feedback.  Nobie Putnam, DO [place]

## 2019-12-01 LAB — CBC WITH DIFFERENTIAL/PLATELET
Absolute Monocytes: 984 cells/uL — ABNORMAL HIGH (ref 200–950)
Basophils Absolute: 28 cells/uL (ref 0–200)
Basophils Relative: 0.3 %
Eosinophils Absolute: 46 cells/uL (ref 15–500)
Eosinophils Relative: 0.5 %
HCT: 44.9 % (ref 38.5–50.0)
Hemoglobin: 15.5 g/dL (ref 13.2–17.1)
Lymphs Abs: 2640 cells/uL (ref 850–3900)
MCH: 30.3 pg (ref 27.0–33.0)
MCHC: 34.5 g/dL (ref 32.0–36.0)
MCV: 87.7 fL (ref 80.0–100.0)
MPV: 12.7 fL — ABNORMAL HIGH (ref 7.5–12.5)
Monocytes Relative: 10.7 %
Neutro Abs: 5502 cells/uL (ref 1500–7800)
Neutrophils Relative %: 59.8 %
Platelets: 204 10*3/uL (ref 140–400)
RBC: 5.12 10*6/uL (ref 4.20–5.80)
RDW: 13.9 % (ref 11.0–15.0)
Total Lymphocyte: 28.7 %
WBC: 9.2 10*3/uL (ref 3.8–10.8)

## 2019-12-01 LAB — T4, FREE: Free T4: 1.4 ng/dL (ref 0.8–1.8)

## 2019-12-01 LAB — COMPLETE METABOLIC PANEL WITH GFR
AG Ratio: 2.1 (calc) (ref 1.0–2.5)
ALT: 15 U/L (ref 9–46)
AST: 26 U/L (ref 10–35)
Albumin: 4.1 g/dL (ref 3.6–5.1)
Alkaline phosphatase (APISO): 56 U/L (ref 35–144)
BUN/Creatinine Ratio: 21 (calc) (ref 6–22)
BUN: 29 mg/dL — ABNORMAL HIGH (ref 7–25)
CO2: 25 mmol/L (ref 20–32)
Calcium: 9.5 mg/dL (ref 8.6–10.3)
Chloride: 104 mmol/L (ref 98–110)
Creat: 1.36 mg/dL — ABNORMAL HIGH (ref 0.70–1.11)
GFR, Est African American: 54 mL/min/{1.73_m2} — ABNORMAL LOW (ref >=60)
GFR, Est Non African American: 46 mL/min/{1.73_m2} — ABNORMAL LOW (ref >=60)
Globulin: 2 g/dL (calc) (ref 1.9–3.7)
Glucose, Bld: 91 mg/dL (ref 65–99)
Potassium: 4.4 mmol/L (ref 3.5–5.3)
Sodium: 137 mmol/L (ref 135–146)
Total Bilirubin: 0.9 mg/dL (ref 0.2–1.2)
Total Protein: 6.1 g/dL (ref 6.1–8.1)

## 2019-12-01 LAB — TSH: TSH: 5.21 mIU/L — ABNORMAL HIGH (ref 0.40–4.50)

## 2019-12-02 LAB — CULTURE, URINE COMPREHENSIVE
MICRO NUMBER:: 10013291
RESULT:: NO GROWTH
SPECIMEN QUALITY:: ADEQUATE

## 2019-12-26 DIAGNOSIS — H401132 Primary open-angle glaucoma, bilateral, moderate stage: Secondary | ICD-10-CM | POA: Diagnosis not present

## 2019-12-28 ENCOUNTER — Ambulatory Visit (INDEPENDENT_AMBULATORY_CARE_PROVIDER_SITE_OTHER): Payer: Medicare Other | Admitting: Family Medicine

## 2019-12-28 ENCOUNTER — Encounter: Payer: Self-pay | Admitting: Family Medicine

## 2019-12-28 ENCOUNTER — Other Ambulatory Visit: Payer: Self-pay

## 2019-12-28 VITALS — BP 101/57 | HR 72 | Temp 97.3°F | Ht 66.0 in | Wt 145.2 lb

## 2019-12-28 DIAGNOSIS — N183 Chronic kidney disease, stage 3 unspecified: Secondary | ICD-10-CM

## 2019-12-28 DIAGNOSIS — R4181 Age-related cognitive decline: Secondary | ICD-10-CM | POA: Diagnosis not present

## 2019-12-28 DIAGNOSIS — I950 Idiopathic hypotension: Secondary | ICD-10-CM | POA: Diagnosis not present

## 2019-12-28 DIAGNOSIS — I129 Hypertensive chronic kidney disease with stage 1 through stage 4 chronic kidney disease, or unspecified chronic kidney disease: Secondary | ICD-10-CM

## 2019-12-28 DIAGNOSIS — G4709 Other insomnia: Secondary | ICD-10-CM

## 2019-12-28 NOTE — Patient Instructions (Addendum)
Thank you for coming to the office today.  Due to low blood pressure, we will HOLD or DISCONTINUE Spironolactone 25mg  daily (lunchtime), check BP and see if feel better. Keep an eye on swelling in extremities. - also may reduce urination  In future if need to restart Arlyce Harman - then we can target LOSARTAN 50mg  - cut in half for 25mg  daily or can consider HOLDING that one completely.  Continue Melatonin 2.5mg  (half of 5mg ) OR can take whole pill 5mg  nightly for now  Referral to Neurology  [facility] - Neurology Dept [place], [place] [address] Phone: [phone]   Please schedule a Follow-up Appointment to: Return in about 3 months (around 03/26/2020), or if symptoms worsen or fail to improve, for keep apt as scheduled.  If you have any other questions or concerns, please feel free to call the office or send a message through [place]. You may also schedule an earlier appointment if necessary.  Additionally, you may be receiving a survey about your experience at our office within a few days to 1 week by e-mail or mail. We value your feedback.  Nobie Putnam, DO [place]

## 2019-12-28 NOTE — Assessment & Plan Note (Signed)
Still Lower BP today Known episodic dizziness still with postural symptoms. No recent acute orthostatic hypotension or syncope Chronic poor hydration - Home BP readings Complication with CKD-III OFF Amlodipine    Plan:  1. DC Spironolactone 25mg  daily - only taking for HTN, and history of edema - may  Help limit his nocturia as well. If not as hydrated likely spiro is affecting him too much 2. Continue rest of current BP regimen Losartan 50mg  daily, Acetabutolol 200mg  2. Encourage improved lifestyle - low sodium diet, regular exercise 3. Continue monitor BP outside office, bring readings to next visit, if persistently >140/90 or new symptoms notify office sooner  F/u within 3 months as scheduled. If need to re-adjust BP again, we can reconsider restart Arlyce Harman or reduce dose only to 12.5 and possibly reduce Losartan next if need

## 2019-12-28 NOTE — Progress Notes (Signed)
Subjective:    Patient ID: Manuel Reynolds, male    DOB: 1930-09-10, 86 y.o.   MRN: [id number]  Manuel Reynolds is a 86 y.o. male presenting on 12/28/2019 for Insomnia (also concern about crazy dreams only at night time. Hallucination after waking up x 2 mths. Pt has waking up from a dream and after he woke up it has taking a awhile to get oriented. ) and Hypotension (pt wife concern about low blood pressure reading )   HPI   History provided today primarily by patient's wife and caregiver Winn Jock, patient also provides some history.  Hallucinations / Insomnia / Cognitive Decline  - Last visit with me 11/30/19, for same problem insomnia, treated with Trazodone, see prior notes for background information. - Interval update with he tried Trazodone 25-50mg  nightly, tried for period of time but did not feel well on this med, made it worse during daytime, stopped med. - Today patient reports now doing somewhat better with insomnia, he takes Melatonin 2.5mg  nightly (half of 5mg  tab), has tried whole tab before, seems to sleep better, several hours, still wakes up at night to void often, but overall some improved. Less dreams or nightmares. - He still exhibits concerns of declining cognition or confusion / memory, in the past Neurologist was discussed, but they have not seen one yet in consultation. - He has prior history of night-time hypopompic hallucinations in past - He lives at home with his wife who is primary caregiver for him, he is able to still do many ADLs but does benefit from her assistance. There have been no reports of aggressive or other abnormal behavior or any risk of harm to himself or others. Admits difficulty with vision, has chronic poor vision, and uses glasses often has problem with vision especially in distance. Admits poor hydration limited water intake. He has good appetite though for meals. Denies dysuria, urinary frequency, hematuria, fever, chills, cough, short of breath,  abdominal pain, nausea vomiting  Orthostatic Dizziness vs Hypotension / Chronic HTN w/ CKD-III - Last visit with me 08/2019, for same problem, treated with DC'd Amlodipine 5mg  due to low BPs and dizziness, see prior notes for background information. - Interval update with improved dizziness off Amlodipine but has not resolved. - Today patient reports now concern is may need reduced BP meds. He takes Spironolactone 25mg  and believes this was started by prior PCP in past due to swelling, he has not had problem with swelling in long time. He has no history of CHF. He is not always as hydrated. - Worse dizziness when stand up at times. - he is on multiple BP medications - Acebutol 200mg , Losartan 50mg  daily, Spironolactone 25mg  daily Denies any persistent or chronic lightheaded or dizziness, or room spinning vertigo, headache, loss of vision, near or syncopal event, chest pain, dyspnea, cough fever, edema   Depression screen [facility] 2/9 09/13/2019 07/28/2019 01/17/2019  Decreased Interest 0 0 0  Down, Depressed, Hopeless 0 0 0  PHQ - 2 Score 0 0 0  Altered sleeping - - -  Tired, decreased energy - - -  Change in appetite - - -  Feeling bad or failure about yourself  - - -  Trouble concentrating - - -  Moving slowly or fidgety/restless - - -  Suicidal thoughts - - -  PHQ-9 Score - - -    Social History   Tobacco Use  . Smoking status: Never Smoker  . Smokeless tobacco: Never Used  Substance Use Topics  .  Alcohol use: No  . Drug use: No    Review of Systems Per HPI unless specifically indicated above     Objective:    BP (!) 101/57   Pulse 72   Temp (!) 97.3 F (36.3 C) (Oral)   Ht 5\' 6"  (1.676 m)   Wt 145 lb 3.2 oz (65.9 kg)   BMI 23.44 kg/m   Wt Readings from Last 3 Encounters:  12/28/19 145 lb 3.2 oz (65.9 kg)  11/30/19 155 lb 9.6 oz (70.6 kg)  09/13/19 154 lb 4.8 oz (70 kg)    Physical Exam Vitals and nursing note reviewed.  Constitutional:      General: He is not in  acute distress.    Appearance: He is well-developed. He is not diaphoretic.     Comments: Chronically ill but mostly well 86 year old elderly male, comfortable, cooperative, thin appearance  HENT:     Head: Normocephalic and atraumatic.  Eyes:     General:        Right eye: No discharge.        Left eye: No discharge.     Conjunctiva/sclera: Conjunctivae normal.  Neck:     Thyroid: No thyromegaly.  Cardiovascular:     Rate and Rhythm: Normal rate and regular rhythm.     Heart sounds: Normal heart sounds. No murmur.  Pulmonary:     Effort: Pulmonary effort is normal. No respiratory distress.     Breath sounds: Normal breath sounds. No wheezing or rales.  Musculoskeletal:        General: Normal range of motion.     Cervical back: Normal range of motion and neck supple.  Lymphadenopathy:     Cervical: No cervical adenopathy.  Skin:    General: Skin is warm and dry.     Findings: No erythema or rash.  Neurological:     Mental Status: He is alert and oriented to person, place, and time.  Psychiatric:        Behavior: Behavior normal.     Comments: Well groomed, good eye contact, normal speech and thoughts.    Results for orders placed or performed in visit on 11/30/19  CULTURE, URINE COMPREHENSIVE   Specimen: Urine  Result Value Ref Range   MICRO NUMBER: [id number]    SPECIMEN QUALITY: Adequate    Source URINE    STATUS: FINAL    RESULT: No Growth   [facility] - CMET w/ GFR CMP Complete Metabolic Panel physical  Result Value Ref Range   Glucose, Bld 91 65 - 99 mg/dL   BUN 29 (H) 7 - 25 mg/dL   Creat 1.36 (H) 0.70 - 1.11 mg/dL   GFR, Est Non African American 46 (L) > OR = 60 mL/min/1.74m2   GFR, Est African American 54 (L) > OR = 60 mL/min/1.74m2   BUN/Creatinine Ratio 21 6 - 22 (calc)   Sodium 137 135 - 146 mmol/L   Potassium 4.4 3.5 - 5.3 mmol/L   Chloride 104 98 - 110 mmol/L   CO2 25 20 - 32 mmol/L   Calcium 9.5 8.6 - 10.3 mg/dL   Total Protein 6.1 6.1 - 8.1 g/dL    Albumin 4.1 3.6 - 5.1 g/dL   Globulin 2.0 1.9 - 3.7 g/dL (calc)   AG Ratio 2.1 1.0 - 2.5 (calc)   Total Bilirubin 0.9 0.2 - 1.2 mg/dL   Alkaline phosphatase (APISO) 56 35 - 144 U/L   AST 26 10 - 35 U/L   ALT 15  9 - 46 U/L  [facility] - CBC with Differential/Platelet physical  Result Value Ref Range   WBC 9.2 3.8 - 10.8 Thousand/uL   RBC 5.12 4.20 - 5.80 Million/uL   Hemoglobin 15.5 13.2 - 17.1 g/dL   HCT 44.9 38.5 - 50.0 %   MCV 87.7 80.0 - 100.0 fL   MCH 30.3 27.0 - 33.0 pg   MCHC 34.5 32.0 - 36.0 g/dL   RDW 13.9 11.0 - 15.0 %   Platelets 204 140 - 400 Thousand/uL   MPV 12.7 (H) 7.5 - 12.5 fL   Neutro Abs 5,502 1,500 - 7,800 cells/uL   Lymphs Abs 2,640 850 - 3,900 cells/uL   Absolute Monocytes 984 (H) 200 - 950 cells/uL   Eosinophils Absolute 46 15 - 500 cells/uL   Basophils Absolute 28 0 - 200 cells/uL   Neutrophils Relative % 59.8 %   Total Lymphocyte 28.7 %   Monocytes Relative 10.7 %   Eosinophils Relative 0.5 %   Basophils Relative 0.3 %  [facility] - TSH  Result Value Ref Range   TSH 5.21 (H) 0.40 - 4.50 mIU/L  [facility] - T4, free  Result Value Ref Range   Free T4 1.4 0.8 - 1.8 ng/dL  POCT Urinalysis Dipstick  Result Value Ref Range   Color, UA yellow    Clarity, UA clear    Glucose, UA Negative Negative   Bilirubin, UA Negative    Ketones, UA Negative    Spec Grav, UA 1.010 1.010 - 1.025   Blood, UA Negative    pH, UA 5.0 5.0 - 8.0   Protein, UA Negative Negative   Urobilinogen, UA 0.2 0.2 or 1.0 E.U./dL   Nitrite, UA Negative    Leukocytes, UA Negative Negative   Appearance clear    Odor        Assessment & Plan:   Problem List Items Addressed This Visit    Other insomnia - Primary   Relevant Orders   Ambulatory referral to Neurology   Benign hypertension with CKD (chronic kidney disease) stage III    Still Lower BP today Known episodic dizziness still with postural symptoms. No recent acute orthostatic hypotension or syncope Chronic poor hydration - Home BP  readings Complication with CKD-III OFF Amlodipine    Plan:  1. DC Spironolactone 25mg  daily - only taking for HTN, and history of edema - may  Help limit his nocturia as well. If not as hydrated likely spiro is affecting him too much 2. Continue rest of current BP regimen Losartan 50mg  daily, Acetabutolol 200mg  2. Encourage improved lifestyle - low sodium diet, regular exercise 3. Continue monitor BP outside office, bring readings to next visit, if persistently >140/90 or new symptoms notify office sooner  F/u within 3 months as scheduled. If need to re-adjust BP again, we can reconsider restart Arlyce Harman or reduce dose only to 12.5 and possibly reduce Losartan next if need       Other Visit Diagnoses    Age-related cognitive decline       Relevant Orders   Ambulatory referral to Neurology   Idiopathic hypotension          #Insomnia / Cognitive Decline Chronic underlying insomnia, also affected by nocturia known issue being addressed, now should be less off spironolactone Complicated by known gradual cognitive decline as well and memory issue, but not consistent with advanced dementia - History of hypnopompic hallucinations, not having daytime hallucinations or other perception problems  Failed Trazodone recently, ineffective and some  side effect  Improved on melatonin low dose Recommend continue melatonin 2.5 or 5mg  nightly  Referral to Neuro for further consultation on cognitive decline and sleep related symptoms.   Orders Placed This Encounter  Procedures  . Ambulatory referral to Neurology    Referral Priority:   Routine    Referral Type:   Consultation    Referral Reason:   Specialty Services Required    Requested Specialty:   Neurology    Number of Visits Requested:   1      No orders of the defined types were placed in this encounter.     Follow up plan: Return in about 3 months (around 03/26/2020), or if symptoms worsen or fail to improve, for keep apt as  scheduled.   Nobie Putnam, DO [facility] 12/28/2019, 1:30 PM

## 2020-01-31 ENCOUNTER — Other Ambulatory Visit: Payer: Self-pay

## 2020-01-31 ENCOUNTER — Ambulatory Visit (INDEPENDENT_AMBULATORY_CARE_PROVIDER_SITE_OTHER): Payer: Medicare Other | Admitting: Podiatry

## 2020-01-31 DIAGNOSIS — B351 Tinea unguium: Secondary | ICD-10-CM | POA: Diagnosis not present

## 2020-01-31 DIAGNOSIS — M79676 Pain in unspecified toe(s): Secondary | ICD-10-CM

## 2020-02-03 NOTE — Progress Notes (Signed)
   SUBJECTIVE Patient presents to office today complaining of elongated, thickened nails that cause pain while ambulating in shoes. He is unable to trim his own nails. Patient is here for further evaluation and treatment.  Past Medical History:  Diagnosis Date  . Anxiety   . Hypertension   . 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 Thyroid disease     OBJECTIVE General Patient is awake, alert, and oriented x 3 and in no acute distress. Derm Skin is dry and supple bilateral. Negative open lesions or macerations. Remaining integument unremarkable. Nails are tender, long, thickened and dystrophic with subungual debris, consistent with onychomycosis, 1-5 bilateral. No signs of infection noted. Vasc  DP and PT pedal pulses palpable bilaterally. Temperature gradient within normal limits.  Neuro Epicritic and protective threshold sensation grossly intact bilaterally.  Musculoskeletal Exam No symptomatic pedal deformities noted bilateral. Muscular strength within normal limits.  ASSESSMENT 1. Onychodystrophic nails 1-5 bilateral with hyperkeratosis of nails.  2. Onychomycosis of nail due to dermatophyte bilateral 3. Pain in foot bilateral  PLAN OF CARE 1. Patient evaluated today.  2. Instructed to maintain good pedal hygiene and foot care.  3. Mechanical debridement of nails 1-5 bilaterally performed using a nail nipper. Filed with dremel without incident.  4. Return to clinic in 3 mos.    Edrick Kins, DPM [facility]  Dr. Edrick Kins, [address]                                        [place], [place] [address]                Office [phone]  Fax [phone]

## 2020-03-06 ENCOUNTER — Other Ambulatory Visit: Payer: Medicare Other

## 2020-03-06 ENCOUNTER — Other Ambulatory Visit: Payer: Self-pay

## 2020-03-06 DIAGNOSIS — I129 Hypertensive chronic kidney disease with stage 1 through stage 4 chronic kidney disease, or unspecified chronic kidney disease: Secondary | ICD-10-CM | POA: Diagnosis not present

## 2020-03-06 DIAGNOSIS — N1831 Chronic kidney disease, stage 3a: Secondary | ICD-10-CM

## 2020-03-06 DIAGNOSIS — R351 Nocturia: Secondary | ICD-10-CM

## 2020-03-06 DIAGNOSIS — R7303 Prediabetes: Secondary | ICD-10-CM | POA: Diagnosis not present

## 2020-03-06 DIAGNOSIS — C8299 Follicular lymphoma, unspecified, extranodal and solid organ sites: Secondary | ICD-10-CM | POA: Diagnosis not present

## 2020-03-06 DIAGNOSIS — N183 Chronic kidney disease, stage 3 unspecified: Secondary | ICD-10-CM | POA: Diagnosis not present

## 2020-03-06 DIAGNOSIS — E782 Mixed hyperlipidemia: Secondary | ICD-10-CM | POA: Diagnosis not present

## 2020-03-06 DIAGNOSIS — E034 Atrophy of thyroid (acquired): Secondary | ICD-10-CM

## 2020-03-07 LAB — LIPID PANEL
Cholesterol: 158 mg/dL (ref <200)
HDL: 36 mg/dL — ABNORMAL LOW (ref >=40)
LDL Cholesterol (Calc): 102 mg/dL (calc) — ABNORMAL HIGH
Non-HDL Cholesterol (Calc): 122 mg/dL (calc) (ref <130)
Total CHOL/HDL Ratio: 4.4 (calc) (ref <5.0)
Triglycerides: 104 mg/dL (ref <150)

## 2020-03-07 LAB — HEMOGLOBIN A1C
Hgb A1c MFr Bld: 5.7 % of total Hgb — ABNORMAL HIGH (ref <5.7)
Mean Plasma Glucose: 117 (calc)
eAG (mmol/L): 6.5 (calc)

## 2020-03-07 LAB — COMPLETE METABOLIC PANEL WITH GFR
AG Ratio: 1.7 (calc) (ref 1.0–2.5)
ALT: 18 U/L (ref 9–46)
AST: 23 U/L (ref 10–35)
Albumin: 3.8 g/dL (ref 3.6–5.1)
Alkaline phosphatase (APISO): 60 U/L (ref 35–144)
BUN/Creatinine Ratio: 16 (calc) (ref 6–22)
BUN: 21 mg/dL (ref 7–25)
CO2: 26 mmol/L (ref 20–32)
Calcium: 9.4 mg/dL (ref 8.6–10.3)
Chloride: 106 mmol/L (ref 98–110)
Creat: 1.31 mg/dL — ABNORMAL HIGH (ref 0.70–1.11)
GFR, Est African American: 56 mL/min/{1.73_m2} — ABNORMAL LOW (ref >=60)
GFR, Est Non African American: 48 mL/min/{1.73_m2} — ABNORMAL LOW (ref >=60)
Globulin: 2.3 g/dL (calc) (ref 1.9–3.7)
Glucose, Bld: 109 mg/dL — ABNORMAL HIGH (ref 65–99)
Potassium: 3.9 mmol/L (ref 3.5–5.3)
Sodium: 138 mmol/L (ref 135–146)
Total Bilirubin: 0.8 mg/dL (ref 0.2–1.2)
Total Protein: 6.1 g/dL (ref 6.1–8.1)

## 2020-03-07 LAB — T4, FREE: Free T4: 1.3 ng/dL (ref 0.8–1.8)

## 2020-03-07 LAB — CBC WITH DIFFERENTIAL/PLATELET
Absolute Monocytes: 916 cells/uL (ref 200–950)
Basophils Absolute: 34 cells/uL (ref 0–200)
Basophils Relative: 0.4 %
Eosinophils Absolute: 92 cells/uL (ref 15–500)
Eosinophils Relative: 1.1 %
HCT: 44 % (ref 38.5–50.0)
Hemoglobin: 14.5 g/dL (ref 13.2–17.1)
Lymphs Abs: 2587 cells/uL (ref 850–3900)
MCH: 29.8 pg (ref 27.0–33.0)
MCHC: 33 g/dL (ref 32.0–36.0)
MCV: 90.5 fL (ref 80.0–100.0)
MPV: 12.9 fL — ABNORMAL HIGH (ref 7.5–12.5)
Monocytes Relative: 10.9 %
Neutro Abs: 4771 cells/uL (ref 1500–7800)
Neutrophils Relative %: 56.8 %
Platelets: 196 10*3/uL (ref 140–400)
RBC: 4.86 10*6/uL (ref 4.20–5.80)
RDW: 14.6 % (ref 11.0–15.0)
Total Lymphocyte: 30.8 %
WBC: 8.4 10*3/uL (ref 3.8–10.8)

## 2020-03-07 LAB — TSH: TSH: 5.17 mIU/L — ABNORMAL HIGH (ref 0.40–4.50)

## 2020-03-07 LAB — PSA: PSA: 0.8 ng/mL (ref <=4.0)

## 2020-03-08 DIAGNOSIS — F411 Generalized anxiety disorder: Secondary | ICD-10-CM | POA: Insufficient documentation

## 2020-03-08 DIAGNOSIS — R413 Other amnesia: Secondary | ICD-10-CM | POA: Insufficient documentation

## 2020-03-08 DIAGNOSIS — G479 Sleep disorder, unspecified: Secondary | ICD-10-CM | POA: Insufficient documentation

## 2020-03-08 DIAGNOSIS — R441 Visual hallucinations: Secondary | ICD-10-CM | POA: Diagnosis not present

## 2020-03-13 ENCOUNTER — Other Ambulatory Visit: Payer: Self-pay

## 2020-03-13 ENCOUNTER — Ambulatory Visit (INDEPENDENT_AMBULATORY_CARE_PROVIDER_SITE_OTHER): Payer: Medicare Other | Admitting: Family Medicine

## 2020-03-13 ENCOUNTER — Encounter: Payer: Self-pay | Admitting: Family Medicine

## 2020-03-13 VITALS — BP 143/77 | HR 69 | Temp 97.7°F | Ht 66.0 in | Wt 143.0 lb

## 2020-03-13 DIAGNOSIS — G301 Alzheimer's disease with late onset: Secondary | ICD-10-CM | POA: Diagnosis not present

## 2020-03-13 DIAGNOSIS — G309 Alzheimer's disease, unspecified: Secondary | ICD-10-CM | POA: Insufficient documentation

## 2020-03-13 DIAGNOSIS — R7303 Prediabetes: Secondary | ICD-10-CM

## 2020-03-13 DIAGNOSIS — F0281 Dementia in other diseases classified elsewhere with behavioral disturbance: Secondary | ICD-10-CM

## 2020-03-13 DIAGNOSIS — C8590 Non-Hodgkin lymphoma, unspecified, unspecified site: Secondary | ICD-10-CM | POA: Diagnosis not present

## 2020-03-13 DIAGNOSIS — N1831 Chronic kidney disease, stage 3a: Secondary | ICD-10-CM | POA: Diagnosis not present

## 2020-03-13 DIAGNOSIS — E034 Atrophy of thyroid (acquired): Secondary | ICD-10-CM | POA: Diagnosis not present

## 2020-03-13 DIAGNOSIS — N183 Chronic kidney disease, stage 3 unspecified: Secondary | ICD-10-CM

## 2020-03-13 DIAGNOSIS — I129 Hypertensive chronic kidney disease with stage 1 through stage 4 chronic kidney disease, or unspecified chronic kidney disease: Secondary | ICD-10-CM

## 2020-03-13 DIAGNOSIS — G4709 Other insomnia: Secondary | ICD-10-CM | POA: Diagnosis not present

## 2020-03-13 DIAGNOSIS — F0280 Dementia in other diseases classified elsewhere without behavioral disturbance: Secondary | ICD-10-CM | POA: Insufficient documentation

## 2020-03-13 NOTE — Progress Notes (Signed)
Subjective:    Patient ID: Manuel Reynolds, male    DOB: 12-19-1929, 86 y.o.   MRN: [id number]  Manuel Reynolds is a 86 y.o. male presenting on 03/13/2020 for Hypertension (blood work follow up)  History provided today primarily by patient's wife and caregiver Winn Jock, patient also provides some history.  HPI  Hallucinations / Insomnia / Moderate Dementia with behavioral disturbance - Last visit with me 12/28/19, for same problem insomnia, previously treated w Trazodone seemed to be ineffective, and was referred to Neurology, see prior notes for background information. - Interval update still had hallucinations and issues with insomnia. He has now seen [facility] Neurology, initial consult on 03/08/20 Dr Gurney Maxin, he was diagnosed with dementia, based on cognitive testing and further history, he was documented to be able to complete most ADLs at home with assistance of wife and caregiver [name], he has had issues with anxiety and sleep and hallucinations, he was given new rx Seroquel (half dose for 12.5mg ) nightly. - Today he has not started the Seroquel medicine due to patient's wife having concerns on FDA safety and indications and side effects. She has questions on this topic today. - Next apt with Dr Melrose Nakayama is in 03/2020 - He still exhibits concerns of declining cognition or confusion / memory -Known history of night-time hypopompic hallucinations - He lives at home with his wife who is primary caregiver for him, he is able to still do many ADLs but does benefit from her assistance. There have been no reports of aggressive or other abnormal behavior or any risk of harm to himself or others. Admits difficulty with vision, has chronic poor vision, and uses glasses often has problem with vision especially in distance. Admits poor hydration limited water intake. He has good appetite though for meals. Denies dysuria, urinary frequency, hematuria, fever, chills, cough, short of breath, abdominal pain,  nausea vomiting  Chronic HTN w/ CKD-III - Last visit with me 11/2019, for same problem, in past DC'd Amlodipine 5mg  and Spironolactone 25 due to low BPs and dizziness, see prior notes for background information. - He seems to do fairly well currently - Last labs show slightly improved kidney function, stable CKD - he is on multiple BP medications - Acebutol 200mg , Losartan 50mg  daily Denies any persistent or chronic lightheaded or dizziness, or room spinning vertigo, headache, loss of vision, near or syncopal event, chest pain, dyspnea, cough fever, edema  Elevated A1c Last lab improved A1c to 5.7, previously 5.9. He has occasional reduced appetite but overall seems to maintain his nutrition  Hypothyroidism Last lab 02/2020 showed normal range Thyroid panel. He continues on Levothyroxine 3mcg daily.  Hyperlipidemia - Reports no concerns. Last lipid panel 02/2020, controlled  - Currently taking Simvastatin 40mg , tolerating well without side effects or myalgias  Constipation Reports episodes of constipation lasts for few days at a time, can have limited bowel movement at times, he may go period of few day without BM. He often will take apple sauce and canned peaches to help with fiber. Also tried Docusate OTC stool softener at times. Denies dark stool blood in stool nausea vomiting abdominal pain.  Lymphoma (NHL, Follicular) In remission Last visit Oncology since 2019. Has not returned. Has had labs normal range. They plan to stop follow up with Oncology for now. He is asymptomatic.  Health Maintenance:  Prostate CA Screening: Negative 0.8 (02/2020)  Depression screen [facility] 2/9 03/13/2020 03/13/2020 09/13/2019  Decreased Interest 2 1 0  Down, Depressed, Hopeless 1 1  0  PHQ - 2 Score 3 2 0  Altered sleeping 2 3 -  Tired, decreased energy 2 - -  Change in appetite 1 - -  Feeling bad or failure about yourself  0 - -  Trouble concentrating 3 - -  Moving slowly or fidgety/restless 1 - -    Suicidal thoughts 0 - -  PHQ-9 Score 12 5 -  Difficult doing work/chores Somewhat difficult - -    Social History   Tobacco Use  . Smoking status: Never Smoker  . Smokeless tobacco: Never Used  Substance Use Topics  . Alcohol use: No  . Drug use: No    Review of Systems Per HPI unless specifically indicated above     Objective:    BP (!) 143/77   Pulse 69   Temp 97.7 F (36.5 C) (Temporal)   Ht 5\' 6"  (1.676 m)   Wt 143 lb (64.9 kg)   SpO2 100%   BMI 23.08 kg/m   Wt Readings from Last 3 Encounters:  03/13/20 143 lb (64.9 kg)  12/28/19 145 lb 3.2 oz (65.9 kg)  11/30/19 155 lb 9.6 oz (70.6 kg)    Physical Exam Vitals and nursing note reviewed.  Constitutional:      General: He is not in acute distress.    Appearance: He is well-developed. He is not diaphoretic.     Comments: Chronically ill but mostly well 86 year old elderly male, comfortable, cooperative, thin appearance  HENT:     Head: Normocephalic and atraumatic.  Eyes:     General:        Right eye: No discharge.        Left eye: No discharge.     Conjunctiva/sclera: Conjunctivae normal.  Neck:     Thyroid: No thyromegaly.  Cardiovascular:     Rate and Rhythm: Normal rate and regular rhythm.     Heart sounds: Normal heart sounds. No murmur.  Pulmonary:     Effort: Pulmonary effort is normal. No respiratory distress.     Breath sounds: Normal breath sounds. No wheezing or rales.  Musculoskeletal:        General: Normal range of motion.     Cervical back: Normal range of motion and neck supple.  Lymphadenopathy:     Cervical: No cervical adenopathy.  Skin:    General: Skin is warm and dry.     Findings: No erythema or rash.  Neurological:     Mental Status: He is alert and oriented to person, place, and time.  Psychiatric:        Behavior: Behavior normal.     Comments: Well groomed, good eye contact, normal speech and thoughts. Limited speech but does follow most of conversation. No abnormal  behaviors today.        Results for orders placed or performed in visit on 03/06/20  PSA  Result Value Ref Range   PSA 0.8 < OR = 4.0 ng/mL  Lipid panel  Result Value Ref Range   Cholesterol 158 <200 mg/dL   HDL 36 (L) > OR = 40 mg/dL   Triglycerides 104 <150 mg/dL   LDL Cholesterol (Calc) 102 (H) mg/dL (calc)   Total CHOL/HDL Ratio 4.4 <5.0 (calc)   Non-HDL Cholesterol (Calc) 122 <130 mg/dL (calc)  COMPLETE METABOLIC PANEL WITH GFR  Result Value Ref Range   Glucose, Bld 109 (H) 65 - 99 mg/dL   BUN 21 7 - 25 mg/dL   Creat 1.31 (H) 0.70 - 1.11  mg/dL   GFR, Est Non African American 48 (L) > OR = 60 mL/min/1.28m2   GFR, Est African American 56 (L) > OR = 60 mL/min/1.59m2   BUN/Creatinine Ratio 16 6 - 22 (calc)   Sodium 138 135 - 146 mmol/L   Potassium 3.9 3.5 - 5.3 mmol/L   Chloride 106 98 - 110 mmol/L   CO2 26 20 - 32 mmol/L   Calcium 9.4 8.6 - 10.3 mg/dL   Total Protein 6.1 6.1 - 8.1 g/dL   Albumin 3.8 3.6 - 5.1 g/dL   Globulin 2.3 1.9 - 3.7 g/dL (calc)   AG Ratio 1.7 1.0 - 2.5 (calc)   Total Bilirubin 0.8 0.2 - 1.2 mg/dL   Alkaline phosphatase (APISO) 60 35 - 144 U/L   AST 23 10 - 35 U/L   ALT 18 9 - 46 U/L  CBC with Differential/Platelet  Result Value Ref Range   WBC 8.4 3.8 - 10.8 Thousand/uL   RBC 4.86 4.20 - 5.80 Million/uL   Hemoglobin 14.5 13.2 - 17.1 g/dL   HCT 44.0 38.5 - 50.0 %   MCV 90.5 80.0 - 100.0 fL   MCH 29.8 27.0 - 33.0 pg   MCHC 33.0 32.0 - 36.0 g/dL   RDW 14.6 11.0 - 15.0 %   Platelets 196 140 - 400 Thousand/uL   MPV 12.9 (H) 7.5 - 12.5 fL   Neutro Abs 4,771 1,500 - 7,800 cells/uL   Lymphs Abs 2,587 850 - 3,900 cells/uL   Absolute Monocytes 916 200 - 950 cells/uL   Eosinophils Absolute 92 15 - 500 cells/uL   Basophils Absolute 34 0 - 200 cells/uL   Neutrophils Relative % 56.8 %   Total Lymphocyte 30.8 %   Monocytes Relative 10.9 %   Eosinophils Relative 1.1 %   Basophils Relative 0.4 %  Hemoglobin A1c  Result Value Ref Range   Hgb A1c  MFr Bld 5.7 (H) <5.7 % of total Hgb   Mean Plasma Glucose 117 (calc)   eAG (mmol/L) 6.5 (calc)  T4, free  Result Value Ref Range   Free T4 1.3 0.8 - 1.8 ng/dL  TSH  Result Value Ref Range   TSH 5.17 (H) 0.40 - 4.50 mIU/L      Assessment & Plan:   Problem List Items Addressed This Visit    Pre-diabetes    Mild elevated A1c to 5.7, borderline normal vs preDM No significant concern, had improved w some slight weight loss Encourage balance diet and nutrition. Not major goal to significantly restrict carbs      Other insomnia    See Dementia A&P Off trazodone ineffective May continue Melatonin if helping F/u with [facility] Neuro Dr Melrose Nakayama - now can trial Seroquel low dose 12.5mg  nightly      Lymphoma in remission ([place])    Stable, without known recurrence Previously followed by [facility] Oncology yearly  Agree to monitor CBC and lab work from our office for now, and return to Oncology as indicated      Late onset Alzheimer's disease with behavioral disturbance ([place])    Currently stable, with history of progressive Alzheimer's dementia, clinically FAST Stage 5-6 (needs help with ADLs), some complication with insomnia and hallucinations at night. No history of significant safety risk.  [facility] Neurology Dr Melrose Nakayama now established [facility] 9/30 (02/2020)  Plan: 1. Reviewed Dementia prognosis and management, explained that may need additional support in future 2. Discussion on Seroquel and safety / side effect, ultimately we agree that patient  can follow Dr Lannie Fields recommendation of low dose Seroquel 12.5mg  nightly for now, to see how he does - he can try this for up to 1 month and f/u with Dr Melrose Nakayama, may adjust dose in future to 25mg . Discussed risks and concern of inc risk of mortality however this is a low dose and commonly used for this reason and will follow Dr Lannie Fields recommendation at this time.       Relevant Medications   QUEtiapine (SEROQUEL) 25 MG tablet   Hypothyroidism     Stable, chronic problem (since 2003) s/p radiation for lymphoma  Plan: 1. Continue current dose Levothyroxine 52mcg daily Check thyroid panel yearly      CKD (chronic kidney disease), stage III - Primary    Stable CKD likely secondary to HTN, also possibly with lymphoma s/p chemotherapy Cr baseline 1.3 to 1.5  Plan: 1. Monitor Cr - improved to stable 2. Remain off NSAIDs. Encouraged Tylenol use PRN pain 3. Improve hydration 4. Control BP      Benign hypertension with CKD (chronic kidney disease) stage III    Mild elevated SBP but overall stable BP now, off previous meds Arlyce Harman, Amlodipine) Known episodic dizziness still with postural symptoms. No recent acute orthostatic hypotension or syncope Chronic poor hydration - Home BP readings Complication with CKD-III OFF Amlodipine, Spironolactone    Plan:  1. Continue current BP regimen Losartan 50mg  daily, Acetabutolol 200mg  2. Encourage improved lifestyle - low sodium diet, regular exercise 3. Continue monitor BP outside office, bring readings to next visit, if persistently >140/90 or new symptoms notify office sooner         No orders of the defined types were placed in this encounter.   Follow up plan: Return in about 6 months (around 09/12/2020) for 6 month follow-up Dementia/Neuro / HTN CKD / Lab BMET + CBC after visit.  Future labs to be ordered for BMET + CBC - will order after visit in 08/2020   Nobie Putnam, [facility] 03/13/2020, 11:23 AM

## 2020-03-13 NOTE — Patient Instructions (Addendum)
Thank you for coming to the office today.  For constipation recommend miralax  For Constipation (less frequent bowel movement that can be hard dry or involve straining).  Recommend trying OTC Miralax 17g = 1 capful in large glass water once daily for now, try several days to see if working, goal is soft stool or BM 1-2 times daily, if too loose then reduce dose or try every other day. If not effective may need to increase it to 2 doses at once in AM or may do 1 in morning and 1 in afternoon/evening  - This medicine is very safe and can be used often without any problem and will not make you dehydrated. It is good for use on AS NEEDED BASIS or even MAINTENANCE therapy for longer term for several days to weeks at a time to help regulate bowel movements  Other more natural remedies or preventative treatment: - Increase hydration with water - Increase fiber in diet (high fiber foods = vegetables, leafy greens, oats/grains) - May take OTC Fiber supplement (metamucil powder or pill/gummy) - May try OTC Probiotic  If miralax doesn't quite work - then can switch to 1 stool soften OTC pill every night.  -----------------  I do agree with Dr Melrose Nakayama - to try the low dose Seroquel 12.5mg  (half of the 25mg ) nightly for now, give it a good 1 month trial to see how it goes, and check back in with Neurology.  Otherwise labs are great. Keep up the great work. No other major med changes.  DUE for NON FASTING BLOOD WORK  (no food or drink after midnight before the lab appointment, only water or coffee without cream/sugar on the morning of)   Please schedule a Follow-up Appointment to: Return in about 6 months (around 09/12/2020) for 6 month follow-up Dementia/Neuro / HTN CKD / Lab BMET + CBC after visit.  If you have any other questions or concerns, please feel free to call the office or send a message through [place]. You may also schedule an earlier appointment if necessary.  Additionally, you may be  receiving a survey about your experience at our office within a few days to 1 week by e-mail or mail. We value your feedback.  Nobie Putnam, DO [place]

## 2020-03-13 NOTE — Assessment & Plan Note (Signed)
Stable CKD likely secondary to HTN, also possibly with lymphoma s/p chemotherapy Cr baseline 1.3 to 1.5  Plan: 1. Monitor Cr - improved to stable 2. Remain off NSAIDs. Encouraged Tylenol use PRN pain 3. Improve hydration 4. Control BP

## 2020-03-13 NOTE — Assessment & Plan Note (Signed)
Currently stable, with history of progressive Alzheimer's dementia, clinically FAST Stage 5-6 (needs help with ADLs), some complication with insomnia and hallucinations at night. No history of significant safety risk.  [facility] Neurology Dr Melrose Nakayama now established [facility]MS 9/30 (02/2020)  Plan: 1. Reviewed Dementia prognosis and management, explained that may need additional support in future 2. Discussion on Seroquel and safety / side effect, ultimately we agree that patient can follow Dr Lannie Fields recommendation of low dose Seroquel 12.5mg  nightly for now, to see how he does - he can try this for up to 1 month and f/u with Dr Melrose Nakayama, may adjust dose in future to 25mg . Discussed risks and concern of inc risk of mortality however this is a low dose and commonly used for this reason and will follow Dr Lannie Fields recommendation at this time.

## 2020-03-13 NOTE — Assessment & Plan Note (Signed)
Mild elevated SBP but overall stable BP now, off previous meds Arlyce Harman, Amlodipine) Known episodic dizziness still with postural symptoms. No recent acute orthostatic hypotension or syncope Chronic poor hydration - Home BP readings Complication with CKD-III OFF Amlodipine, Spironolactone    Plan:  1. Continue current BP regimen Losartan 50mg  daily, Acetabutolol 200mg  2. Encourage improved lifestyle - low sodium diet, regular exercise 3. Continue monitor BP outside office, bring readings to next visit, if persistently >140/90 or new symptoms notify office sooner

## 2020-03-13 NOTE — Assessment & Plan Note (Signed)
Stable, chronic problem (since 2003) s/p radiation for lymphoma  Plan: 1. Continue current dose Levothyroxine 79mcg daily Check thyroid panel yearly

## 2020-03-13 NOTE — Assessment & Plan Note (Signed)
Mild elevated A1c to 5.7, borderline normal vs preDM No significant concern, had improved w some slight weight loss Encourage balance diet and nutrition. Not major goal to significantly restrict carbs

## 2020-03-13 NOTE — Assessment & Plan Note (Signed)
Stable, without known recurrence Previously followed by [facility] Oncology yearly  Agree to monitor CBC and lab work from our office for now, and return to Oncology as indicated

## 2020-03-13 NOTE — Assessment & Plan Note (Signed)
See Dementia A&P Off trazodone ineffective May continue Melatonin if helping F/u with [facility] Neuro Dr Melrose Nakayama - now can trial Seroquel low dose 12.5mg  nightly

## 2020-04-10 ENCOUNTER — Other Ambulatory Visit: Payer: Self-pay

## 2020-04-10 ENCOUNTER — Ambulatory Visit (INDEPENDENT_AMBULATORY_CARE_PROVIDER_SITE_OTHER): Payer: Medicare Other

## 2020-04-10 DIAGNOSIS — Z111 Encounter for screening for respiratory tuberculosis: Secondary | ICD-10-CM

## 2020-04-11 NOTE — Progress Notes (Signed)
Patient was in office to get PPD test for placement.

## 2020-04-12 LAB — TB SKIN TEST
Induration: 0 mm
TB Skin Test: NEGATIVE

## 2020-04-19 DIAGNOSIS — G479 Sleep disorder, unspecified: Secondary | ICD-10-CM | POA: Diagnosis not present

## 2020-04-19 DIAGNOSIS — R413 Other amnesia: Secondary | ICD-10-CM | POA: Diagnosis not present

## 2020-04-19 DIAGNOSIS — F411 Generalized anxiety disorder: Secondary | ICD-10-CM | POA: Diagnosis not present

## 2020-04-19 DIAGNOSIS — R441 Visual hallucinations: Secondary | ICD-10-CM | POA: Diagnosis not present

## 2020-04-30 DIAGNOSIS — H401132 Primary open-angle glaucoma, bilateral, moderate stage: Secondary | ICD-10-CM | POA: Diagnosis not present

## 2020-05-03 ENCOUNTER — Ambulatory Visit: Payer: Medicare Other | Admitting: Podiatry

## 2020-06-04 ENCOUNTER — Other Ambulatory Visit: Payer: Self-pay | Admitting: Family Medicine

## 2020-06-04 DIAGNOSIS — R600 Localized edema: Secondary | ICD-10-CM

## 2020-06-04 MED ORDER — FUROSEMIDE 20 MG PO TABS: 20.0000 mg | ORAL_TABLET | Freq: Every day | ORAL | 2 refills | Status: DC | PRN

## 2020-06-25 ENCOUNTER — Ambulatory Visit (INDEPENDENT_AMBULATORY_CARE_PROVIDER_SITE_OTHER): Payer: Medicare Other | Admitting: Podiatry

## 2020-06-25 ENCOUNTER — Other Ambulatory Visit: Payer: Self-pay

## 2020-06-25 ENCOUNTER — Encounter: Payer: Self-pay | Admitting: Podiatry

## 2020-06-25 DIAGNOSIS — N1831 Chronic kidney disease, stage 3a: Secondary | ICD-10-CM | POA: Diagnosis not present

## 2020-06-25 DIAGNOSIS — M79676 Pain in unspecified toe(s): Secondary | ICD-10-CM

## 2020-06-25 DIAGNOSIS — B351 Tinea unguium: Secondary | ICD-10-CM

## 2020-06-25 NOTE — Progress Notes (Signed)
This patient returns to my office for at risk foot care.  This patient requires this care by a professional since this patient will be at risk due to having chronic kidney disease.   This patient is unable to cut nails himself since the patient cannot reach his nails.These nails are painful walking and wearing shoes.  This patient presents for at risk foot care today.  General Appearance  Alert, conversant and in no acute stress.  Vascular  Dorsalis pedis and posterior tibial  pulses are not  palpable  bilaterally.  Capillary return is within normal limits  bilaterally. Temperature is within normal limits  Bilaterally.Swelling legs  B/L with left leg larger than right leg.  Neurologic  Senn-Weinstein monofilament wire test within normal limits  bilaterally. Muscle power within normal limits bilaterally.  Nails Thick disfigured discolored nails with subungual debris  from hallux to fifth toes bilaterally. No evidence of bacterial infection or drainage bilaterally.  Orthopedic  No limitations of motion  feet .  No crepitus or effusions noted.  No bony pathology or digital deformities noted.  Skin  normotropic skin with no porokeratosis noted bilaterally.  No signs of infections or ulcers noted.     Onychomycosis  Pain in right toes  Pain in left toes  Consent was obtained for treatment procedures.   Mechanical debridement of nails 1-5  bilaterally performed with a nail nipper.  Filed with dremel without incident.    Return office visit    4 months                 Told patient to return for periodic foot care and evaluation due to potential at risk complications.   Gardiner Barefoot DPM

## 2020-06-27 ENCOUNTER — Ambulatory Visit (INDEPENDENT_AMBULATORY_CARE_PROVIDER_SITE_OTHER): Payer: Medicare Other | Admitting: Family Medicine

## 2020-06-27 ENCOUNTER — Other Ambulatory Visit: Payer: Self-pay

## 2020-06-27 ENCOUNTER — Encounter: Payer: Self-pay | Admitting: Family Medicine

## 2020-06-27 VITALS — BP 136/74 | HR 69 | Temp 97.5°F | Resp 16 | Ht 66.0 in | Wt 141.6 lb

## 2020-06-27 DIAGNOSIS — N1831 Chronic kidney disease, stage 3a: Secondary | ICD-10-CM

## 2020-06-27 DIAGNOSIS — I129 Hypertensive chronic kidney disease with stage 1 through stage 4 chronic kidney disease, or unspecified chronic kidney disease: Secondary | ICD-10-CM

## 2020-06-27 DIAGNOSIS — N183 Chronic kidney disease, stage 3 unspecified: Secondary | ICD-10-CM | POA: Diagnosis not present

## 2020-06-27 DIAGNOSIS — R600 Localized edema: Secondary | ICD-10-CM

## 2020-06-27 DIAGNOSIS — Z86718 Personal history of other venous thrombosis and embolism: Secondary | ICD-10-CM

## 2020-06-27 NOTE — Patient Instructions (Addendum)
Thank you for coming to the office today.  Unlikely blood clot DVT in leg, since both swelling, no localized redness or focal pain.  Other factors - off spironolactone diuretic now can make this worse - sedentary less active walking can increase swelling - keep mind of sodium / diet - low nutritional protein albumin in blood will cause swelling too  May take the furosemide 20mg  daily as needed, prefer to use short term only, 3-7 days at a time, then can stop for few days give kidney a break to rest, if use too much can strain kidney function as well.  Compression and Elevation will be primary treatments.   Please schedule a Follow-up Appointment to: Return if symptoms worsen or fail to improve, for keep as scheduled.  If you have any other questions or concerns, please feel free to call the office or send a message through [place]. You may also schedule an earlier appointment if necessary.  Additionally, you may be receiving a survey about your experience at our office within a few days to 1 week by e-mail or mail. We value your feedback.  Nobie Putnam, DO [place]

## 2020-06-27 NOTE — Progress Notes (Signed)
Subjective:    Patient ID: Manuel Reynolds, male    DOB: [birth year], 86 y.o.   MRN: [id number]  Manuel Reynolds is a 86 y.o. male presenting on 06/27/2020 for Hypertension and Foot Swelling (Left is worst than Right onset 3 weeks)  Patient's wife Manuel Reynolds here today for additional history  HPI   Chronic HTN w/ CKD-III Lower Extremity Edema History of DVT Left side  Last visit 02/2020, has had lower BP, has been off Spirolactone >6 months, had lower BP and nocturia and dizziness, now improved off medicine BP has improved overall on current regimen. Off Arlyce Harman - he is on multiple BP medications - Acebutol 200mg , Losartan 50mg  daily Patient requested diuretic few weeks ago, was started on Furosemide 20mg  Less active lately, some pain and swelling L>R, worse in foot below knee, some elevation in recliner Appetite is fairly good, eats larger meal for lunch. Last lab albumin 3.6 Episodic swelling, can be worse at times Not using compression socks regularly History of DVT - Left side, Previously on Xarelto for 1 year after Left DVT in 2013, was on for 1 year, then off. Admits headache, resolved with aspirin  Denies any persistent or chronic lightheaded or dizziness, or room spinning vertigo, headache, loss of vision, near or syncopal event, chest pain, dyspnea, cough fever  Depression screen [facility] 2/9 06/27/2020 03/13/2020 03/13/2020  Decreased Interest 0 2 1  Down, Depressed, Hopeless 0 1 1  PHQ - 2 Score 0 3 2  Altered sleeping - 2 3  Tired, decreased energy - 2 -  Change in appetite - 1 -  Feeling bad or failure about yourself  - 0 -  Trouble concentrating - 3 -  Moving slowly or fidgety/restless - 1 -  Suicidal thoughts - 0 -  PHQ-9 Score - 12 5  Difficult doing work/chores - Somewhat difficult -   GAD 7 : Generalized Anxiety Score 06/27/2020 03/13/2020 07/28/2019 06/19/2017  Nervous, Anxious, on Edge 1 1 0 0  Control/stop worrying 1 1 0 0  Worry too much - different things 1 2 0 1  Trouble  relaxing 0 1 0 0  Restless 0 0 0 0  Easily annoyed or irritable 0 0 0 0  Afraid - awful might happen 1 2 0 0  Total GAD 7 Score 4 7 0 1  Anxiety Difficulty Not difficult at all Somewhat difficult Not difficult at all Not difficult at all     Social History   Tobacco Use  . Smoking status: Never Smoker  . Smokeless tobacco: Never Used  Vaping Use  . Vaping Use: Never used  Substance Use Topics  . Alcohol use: No  . Drug use: No    Review of Systems Per HPI unless specifically indicated above     Objective:    BP 136/74   Pulse 69   Temp (!) 97.5 F (36.4 C) (Temporal)   Resp 16   Ht 5\' 6"  (1.676 m)   Wt 141 lb 9.6 oz (64.2 kg)   SpO2 100%   BMI 22.85 kg/m   Wt Readings from Last 3 Encounters:  06/27/20 141 lb 9.6 oz (64.2 kg)  03/13/20 143 lb (64.9 kg)  12/28/19 145 lb 3.2 oz (65.9 kg)    Physical Exam Vitals and nursing note reviewed.  Constitutional:      General: He is not in acute distress.    Appearance: He is well-developed. He is not diaphoretic.     Comments: Chronically ill but  mostly well 86 year old elderly male, comfortable, cooperative, thin appearance  HENT:     Head: Normocephalic and atraumatic.  Eyes:     General:        Right eye: No discharge.        Left eye: No discharge.     Conjunctiva/sclera: Conjunctivae normal.  Neck:     Thyroid: No thyromegaly.  Cardiovascular:     Rate and Rhythm: Normal rate and regular rhythm.     Heart sounds: Normal heart sounds. No murmur heard.   Pulmonary:     Effort: Pulmonary effort is normal. No respiratory distress.     Breath sounds: Normal breath sounds. No wheezing or rales.  Musculoskeletal:        General: Normal range of motion.     Cervical back: Normal range of motion and neck supple.     Right lower leg: Edema (see RLE documentation) present.     Left lower leg: Edema (+2-3 pitting edema, no erythema, minimal discomfort, some superficial abrasion L side, mostly symmetrical below knee  to ankle/foot) present.  Lymphadenopathy:     Cervical: No cervical adenopathy.  Skin:    General: Skin is warm and dry.     Findings: No erythema or rash.  Neurological:     Mental Status: He is alert and oriented to person, place, and time.  Psychiatric:        Behavior: Behavior normal.     Comments: Well groomed, good eye contact, normal speech and thoughts. Limited speech but does follow most of conversation. No abnormal behaviors today.        Results for orders placed or performed in visit on 04/10/20  PPD  Result Value Ref Range   TB Skin Test Negative    Induration 0.0 mm mm      Assessment & Plan:   Problem List Items Addressed This Visit    History of deep venous thrombosis (DVT) of distal vein of left lower extremity   CKD (chronic kidney disease), stage III   Bilateral lower extremity edema - Primary   Benign hypertension with CKD (chronic kidney disease) stage III      Clinically with bilateral subacute on chronic worsening pitting edema, L>R but on exam seems mostly symmetrical. Concern with prior history DVT LLE, on xarelto anticoagulant for 1 year back in 2013, then off. No provoking incident or injury. No sign of secondary infection or cellulitis No oozing or weeping. No sign of stasis dermatitis Well's Criteria High risk score 1 (due to history of prior DVT) = low risk group for DVT  Reassurance given Reviewed options today Offered bilateral venous doppler US for rule out DVT, however we mutually agree, to hold for now, given lack of supporting clinical evidence today on exam. And patient is comfortable without significant pain, hemodynamic stable  Likely swelling worse now off Spironolactone. But his BP is improved and asymptomatic not dizzy, therefore, risk is too great to return to [place] at this time.  Reviewed RICE therapy, restart compression, limit sodium, improve hydration, improve activity walking.  Discussed his current dosing Lasix 20mg  PRN,  can still use PRN if helpful, caution urinary frequency/nocturia, may affect his kidney function as reviewed. If not helpful can stop or use PRN rarely.  Return criteria given if severe sudden change or new concerns. When to go to hospital ED.   No orders of the defined types were placed in this encounter.     Follow up plan: Return  if symptoms worsen or fail to improve, for keep as scheduled.   Nobie Putnam, DO [facility] 06/27/2020, 9:15 AM

## 2020-08-03 ENCOUNTER — Other Ambulatory Visit: Payer: Self-pay | Admitting: Family Medicine

## 2020-08-03 DIAGNOSIS — E782 Mixed hyperlipidemia: Secondary | ICD-10-CM

## 2020-08-03 DIAGNOSIS — I10 Essential (primary) hypertension: Secondary | ICD-10-CM

## 2020-08-08 ENCOUNTER — Other Ambulatory Visit: Payer: Self-pay | Admitting: Family Medicine

## 2020-08-08 DIAGNOSIS — E034 Atrophy of thyroid (acquired): Secondary | ICD-10-CM

## 2020-08-08 MED ORDER — LEVOTHYROXINE SODIUM 50 MCG PO TABS: ORAL_TABLET | ORAL | 1 refills | Status: AC

## 2020-08-21 DIAGNOSIS — D2261 Melanocytic nevi of right upper limb, including shoulder: Secondary | ICD-10-CM | POA: Diagnosis not present

## 2020-08-21 DIAGNOSIS — C44321 Squamous cell carcinoma of skin of nose: Secondary | ICD-10-CM | POA: Diagnosis not present

## 2020-08-21 DIAGNOSIS — D0439 Carcinoma in situ of skin of other parts of face: Secondary | ICD-10-CM | POA: Diagnosis not present

## 2020-08-21 DIAGNOSIS — D0461 Carcinoma in situ of skin of right upper limb, including shoulder: Secondary | ICD-10-CM | POA: Diagnosis not present

## 2020-08-21 DIAGNOSIS — X32XXXA Exposure to sunlight, initial encounter: Secondary | ICD-10-CM | POA: Diagnosis not present

## 2020-08-21 DIAGNOSIS — D2271 Melanocytic nevi of right lower limb, including hip: Secondary | ICD-10-CM | POA: Diagnosis not present

## 2020-08-21 DIAGNOSIS — Z85828 Personal history of other malignant neoplasm of skin: Secondary | ICD-10-CM | POA: Diagnosis not present

## 2020-08-21 DIAGNOSIS — L570 Actinic keratosis: Secondary | ICD-10-CM | POA: Diagnosis not present

## 2020-08-21 DIAGNOSIS — D485 Neoplasm of uncertain behavior of skin: Secondary | ICD-10-CM | POA: Diagnosis not present

## 2020-08-21 DIAGNOSIS — L821 Other seborrheic keratosis: Secondary | ICD-10-CM | POA: Diagnosis not present

## 2020-08-21 DIAGNOSIS — L218 Other seborrheic dermatitis: Secondary | ICD-10-CM | POA: Diagnosis not present

## 2020-08-21 DIAGNOSIS — D2262 Melanocytic nevi of left upper limb, including shoulder: Secondary | ICD-10-CM | POA: Diagnosis not present

## 2020-08-21 DIAGNOSIS — D225 Melanocytic nevi of trunk: Secondary | ICD-10-CM | POA: Diagnosis not present

## 2020-08-27 ENCOUNTER — Other Ambulatory Visit: Payer: Self-pay | Admitting: Family Medicine

## 2020-08-27 DIAGNOSIS — I10 Essential (primary) hypertension: Secondary | ICD-10-CM

## 2020-08-27 NOTE — Telephone Encounter (Signed)
Requested Prescriptions  Pending Prescriptions Disp Refills   acebutolol (SECTRAL) 200 MG capsule [Pharmacy Med Name: ACEBUTOLOL 200MG  CAPSULES] 90 capsule 3    Sig: TAKE 1 CAPSULE(200 MG) BY MOUTH DAILY     Cardiovascular:  Beta Blockers Passed - 08/27/2020  3:20 AM      Passed - Last BP in normal range    BP Readings from Last 1 Encounters:  06/27/20 136/74         Passed - Last Heart Rate in normal range    Pulse Readings from Last 1 Encounters:  06/27/20 69         Passed - Valid encounter within last 6 months    Recent Outpatient Visits          2 months ago Bilateral lower extremity edema   [place], DO   5 months ago Stage 3a chronic kidney disease   [place], DO   8 months ago Other insomnia   [place], DO   9 months ago Confusion   [place], DO   11 months ago Orthostatic dizziness   [facility] Parks Ranger, [name], DO      Future Appointments            In 3 weeks Parks Ranger, [name], DO [facility], [facility]

## 2020-08-28 ENCOUNTER — Other Ambulatory Visit: Payer: Self-pay | Admitting: Family Medicine

## 2020-08-28 DIAGNOSIS — R600 Localized edema: Secondary | ICD-10-CM

## 2020-08-28 NOTE — Telephone Encounter (Signed)
Requested Prescriptions  Pending Prescriptions Disp Refills  . furosemide (LASIX) 20 MG tablet [Pharmacy Med Name: FUROSEMIDE 20MG  TABLETS] 30 tablet 2    Sig: TAKE 1 TABLET(20 MG) BY MOUTH DAILY AS NEEDED FOR FLUID RETENTION OR SWELLING     Cardiovascular:  Diuretics - Loop Failed - 08/28/2020  3:18 AM      Failed - Cr in normal range and within 360 days    Creat  Date Value Ref Range Status  03/06/2020 1.31 (H) 0.70 - 1.11 mg/dL Final    Comment:    For patients >36 years of age, the reference limit for Creatinine is approximately 13% higher for people identified as African-American. .          Passed - K in normal range and within 360 days    Potassium  Date Value Ref Range Status  03/06/2020 3.9 3.5 - 5.3 mmol/L Final  09/27/2014 4.2 3.5 - 5.1 mmol/L Final         Passed - Ca in normal range and within 360 days    Calcium  Date Value Ref Range Status  03/06/2020 9.4 8.6 - 10.3 mg/dL Final   Calcium, Total  Date Value Ref Range Status  09/27/2014 9.0 8.5 - 10.1 mg/dL Final         Passed - Na in normal range and within 360 days    Sodium  Date Value Ref Range Status  03/06/2020 138 135 - 146 mmol/L Final  03/21/2016 141 134 - 144 mmol/L Final  09/27/2014 138 136 - 145 mmol/L Final         Passed - Last BP in normal range    BP Readings from Last 1 Encounters:  06/27/20 136/74         Passed - Valid encounter within last 6 months    Recent Outpatient Visits          2 months ago Bilateral lower extremity edema   [place], DO   5 months ago Stage 3a chronic kidney disease   [place], DO   8 months ago Other insomnia   [place], DO   9 months ago Confusion   [place], DO   11 months ago Orthostatic dizziness   [facility] [place], [name], DO      Future  Appointments            In 3 weeks Parks Ranger, [name], DO [facility], [facility]

## 2020-08-31 DIAGNOSIS — H401132 Primary open-angle glaucoma, bilateral, moderate stage: Secondary | ICD-10-CM | POA: Diagnosis not present

## 2020-09-05 DIAGNOSIS — D0439 Carcinoma in situ of skin of other parts of face: Secondary | ICD-10-CM | POA: Diagnosis not present

## 2020-09-05 DIAGNOSIS — D0461 Carcinoma in situ of skin of right upper limb, including shoulder: Secondary | ICD-10-CM | POA: Diagnosis not present

## 2020-09-12 ENCOUNTER — Ambulatory Visit: Payer: Medicare Other | Admitting: Family Medicine

## 2020-09-14 ENCOUNTER — Telehealth: Payer: Self-pay | Admitting: Family Medicine

## 2020-09-14 NOTE — Telephone Encounter (Signed)
Copied from [place] [id number]. Topic: Medicare AWV >> Sep 14, 2020 10:54 AM Cher Nakai R wrote: Reason for CRM:   10/22 Left message to inform patient of appointment on Sep 18, 2020 and that it will be completed by phone not in office -srs

## 2020-09-18 ENCOUNTER — Other Ambulatory Visit: Payer: Self-pay

## 2020-09-18 ENCOUNTER — Encounter: Payer: Self-pay | Admitting: Family Medicine

## 2020-09-18 ENCOUNTER — Ambulatory Visit (INDEPENDENT_AMBULATORY_CARE_PROVIDER_SITE_OTHER): Payer: Medicare Other | Admitting: Family Medicine

## 2020-09-18 ENCOUNTER — Ambulatory Visit: Payer: Medicare Other

## 2020-09-18 VITALS — BP 148/86 | HR 74 | Temp 97.7°F | Resp 16 | Ht 66.0 in | Wt 146.0 lb

## 2020-09-18 DIAGNOSIS — R600 Localized edema: Secondary | ICD-10-CM

## 2020-09-18 DIAGNOSIS — N183 Chronic kidney disease, stage 3 unspecified: Secondary | ICD-10-CM

## 2020-09-18 DIAGNOSIS — F0281 Dementia in other diseases classified elsewhere with behavioral disturbance: Secondary | ICD-10-CM | POA: Diagnosis not present

## 2020-09-18 DIAGNOSIS — F02818 Dementia in other diseases classified elsewhere, unspecified severity, with other behavioral disturbance: Secondary | ICD-10-CM

## 2020-09-18 DIAGNOSIS — R42 Dizziness and giddiness: Secondary | ICD-10-CM

## 2020-09-18 DIAGNOSIS — G301 Alzheimer's disease with late onset: Secondary | ICD-10-CM

## 2020-09-18 DIAGNOSIS — N1831 Chronic kidney disease, stage 3a: Secondary | ICD-10-CM | POA: Diagnosis not present

## 2020-09-18 DIAGNOSIS — Z86718 Personal history of other venous thrombosis and embolism: Secondary | ICD-10-CM

## 2020-09-18 DIAGNOSIS — I129 Hypertensive chronic kidney disease with stage 1 through stage 4 chronic kidney disease, or unspecified chronic kidney disease: Secondary | ICD-10-CM | POA: Diagnosis not present

## 2020-09-18 DIAGNOSIS — E034 Atrophy of thyroid (acquired): Secondary | ICD-10-CM | POA: Diagnosis not present

## 2020-09-18 MED ORDER — MECLIZINE HCL 25 MG PO TABS: 25.0000 mg | ORAL_TABLET | Freq: Three times a day (TID) | ORAL | 2 refills | Status: AC | PRN

## 2020-09-18 NOTE — Patient Instructions (Addendum)
Thank you for coming to the office today.  1. You have symptoms of Vertigo (Benign Paroxysmal Positional Vertigo) - This is commonly caused by inner ear fluid imbalance, sometimes can be worsened by allergies and sinus symptoms, otherwise it can occur randomly sometimes and we may never discover the exact cause. - To treat this, try the Epley Manuever (see diagrams/instructions below) at home up to 3 times a day for 1-2 weeks or until symptoms resolve - You may take Meclizine as needed up to 3 times a day for dizziness, this will not cure symptoms but may help. Caution may make you drowsy.  If you develop significant worsening episode with vertigo that does not improve and you get severe headache, loss of vision, arm or leg weakness, slurred speech, or other concerning symptoms please seek immediate medical attention at Emergency Department.  Please schedule a follow-up appointment with Dr Parks Ranger within 4 weeks if Vertigo not improving, and will consider Referral to Vestibular Rehab  See the next page for images describing the Epley Manuever.     ----------------------------------------------------------------------------------------------------------------------          Please schedule a Follow-up Appointment to: Return in about 6 months (around 03/19/2021) for 4 month follow-up HTN, CKD Dizziness.  If you have any other questions or concerns, please feel free to call the office or send a message through [place]. You may also schedule an earlier appointment if necessary.  Additionally, you may be receiving a survey about your experience at our office within a few days to 1 week by e-mail or mail. We value your feedback.  Nobie Putnam, DO [place]

## 2020-09-18 NOTE — Progress Notes (Signed)
Subjective:    Patient ID: Manuel Reynolds, male    DOB: [birth year], 86 y.o.   MRN: [id number]  Manuel Reynolds is a 86 y.o. male presenting on 09/18/2020 for Hypertension   HPI   DIZZINESS Increasing dizziness. He uses cane most of the time and it is helpful. Recently increasing dizziness nearly fall. Tried OTC Dramamine Fam history of mother very dizziness He has had vertigo Still has edema. Occasionally taking Furosemide lasix, not causing dizziness. Eye sight is worse, may cause dizziness too  Chronic HTN w/ CKD-III Lower Extremity Edema History of DVT Left side  Last visit 06/2020, has had lower BP, has been off Spirolactone >6 months, had lower BP and nocturia and dizziness, now improved off medicine BP has improved overall on current regimen. Off Arlyce Harman Now has had some mild elevated BP - he is on multiple BP medications - Acebutol 200mg , Losartan 50mg  daily Patient requested diuretic few weeks ago, was started on Furosemide 20mg  he is doing well but taking intermittently Less active lately, some pain and swelling L>R, worse in foot below knee, some elevation in recliner Appetite is fairly good, eats larger meal for lunch. Last lab albumin 3.6 Episodic swelling, can be worse at times History of DVT - Left side, Previously on Xarelto for 1 year after Left DVT in 2013, was on for 1 year, then off. Admits headache, resolved with aspirin  Denies any persistent or chronic lightheaded or dizziness, or room spinning vertigo, headache, loss of vision, near or syncopal event, chest pain, dyspnea, cough fever   Health Maintenance: UTD Flu Shot 08/21/20  Depression screen [facility] 2/9 06/27/2020 03/13/2020 03/13/2020  Decreased Interest 0 2 1  Down, Depressed, Hopeless 0 1 1  PHQ - 2 Score 0 3 2  Altered sleeping - 2 3  Tired, decreased energy - 2 -  Change in appetite - 1 -  Feeling bad or failure about yourself  - 0 -  Trouble concentrating - 3 -  Moving slowly or fidgety/restless - 1  -  Suicidal thoughts - 0 -  PHQ-9 Score - 12 5  Difficult doing work/chores - Somewhat difficult -    Social History   Tobacco Use  . Smoking status: Never Smoker  . Smokeless tobacco: Never Used  Vaping Use  . Vaping Use: Never used  Substance Use Topics  . Alcohol use: No  . Drug use: No    Review of Systems Per HPI unless specifically indicated above     Objective:    BP (!) 148/86   Pulse 74   Temp 97.7 F (36.5 C) (Temporal)   Resp 16   Ht 5\' 6"  (1.676 m)   Wt 146 lb (66.2 kg)   SpO2 100%   BMI 23.57 kg/m   Wt Readings from Last 3 Encounters:  09/18/20 146 lb (66.2 kg)  06/27/20 141 lb 9.6 oz (64.2 kg)  03/13/20 143 lb (64.9 kg)    Physical Exam Vitals and nursing note reviewed.  Constitutional:      General: He is not in acute distress.    Appearance: He is well-developed. He is not diaphoretic.     Comments: Chronically ill but mostly well 86 year old elderly male, comfortable, cooperative, thin appearance  HENT:     Head: Normocephalic and atraumatic.  Eyes:     General:        Right eye: No discharge.        Left eye: No discharge.  Conjunctiva/sclera: Conjunctivae normal.  Neck:     Thyroid: No thyromegaly.  Cardiovascular:     Rate and Rhythm: Normal rate and regular rhythm.     Heart sounds: Normal heart sounds. No murmur heard.   Pulmonary:     Effort: Pulmonary effort is normal. No respiratory distress.     Breath sounds: Normal breath sounds. No wheezing or rales.  Musculoskeletal:        General: Normal range of motion.     Cervical back: Normal range of motion and neck supple.     Right lower leg: Edema (see RLE documentation) present.     Left lower leg: Edema (+2-3 pitting edema, no erythema, symmetrical below knee to ankle/foot) present.  Lymphadenopathy:     Cervical: No cervical adenopathy.  Skin:    General: Skin is warm and dry.     Findings: No erythema or rash.  Neurological:     Mental Status: He is alert and  oriented to person, place, and time.  Psychiatric:        Behavior: Behavior normal.     Comments: Well groomed, good eye contact, normal speech and thoughts. Limited speech but does follow most of conversation. No abnormal behaviors today.    Results for orders placed or performed in visit on 60/73/71  BASIC METABOLIC PANEL WITH GFR  Result Value Ref Range   Glucose, Bld 78 65 - 99 mg/dL   BUN 22 7 - 25 mg/dL   Creat 1.17 (H) 0.70 - 1.11 mg/dL   GFR, Est Non African American 55 (L) > OR = 60 mL/min/1.25m2   GFR, Est African American 64 > OR = 60 mL/min/1.58m2   BUN/Creatinine Ratio 19 6 - 22 (calc)   Sodium 141 135 - 146 mmol/L   Potassium 4.0 3.5 - 5.3 mmol/L   Chloride 106 98 - 110 mmol/L   CO2 27 20 - 32 mmol/L   Calcium 9.6 8.6 - 10.3 mg/dL  CBC with Differential/Platelet  Result Value Ref Range   WBC 8.5 3.8 - 10.8 Thousand/uL   RBC 5.10 4.20 - 5.80 Million/uL   Hemoglobin 15.3 13.2 - 17.1 g/dL   HCT 45.8 38 - 50 %   MCV 89.8 80.0 - 100.0 fL   MCH 30.0 27.0 - 33.0 pg   MCHC 33.4 32.0 - 36.0 g/dL   RDW 14.0 11.0 - 15.0 %   Platelets 209 140 - 400 Thousand/uL   MPV 12.2 7.5 - 12.5 fL   Neutro Abs 5,058 1,500 - 7,800 cells/uL   Lymphs Abs 2,363 850 - 3,900 cells/uL   Absolute Monocytes 961 (H) 200 - 950 cells/uL   Eosinophils Absolute 77 15.0 - 500.0 cells/uL   Basophils Absolute 43 0.0 - 200.0 cells/uL   Neutrophils Relative % 59.5 %   Total Lymphocyte 27.8 %   Monocytes Relative 11.3 %   Eosinophils Relative 0.9 %   Basophils Relative 0.5 %  TSH  Result Value Ref Range   TSH 0.69 0.40 - 4.50 mIU/L  T4, free  Result Value Ref Range   Free T4 1.8 0.8 - 1.8 ng/dL      Assessment & Plan:   Problem List Items Addressed This Visit    Late onset Alzheimer's disease with behavioral disturbance (HCC)   Hypothyroidism   Relevant Orders   TSH (Completed)   T4, free (Completed)   History of deep venous thrombosis (DVT) of distal vein of left lower extremity   CKD  (chronic kidney disease), stage  III ([place])   Relevant Orders   BASIC METABOLIC PANEL WITH GFR (Completed)   CBC with Differential/Platelet (Completed)   Bilateral lower extremity edema   Benign hypertension with CKD (chronic kidney disease) stage III (HCC) - Primary   Relevant Orders   BASIC METABOLIC PANEL WITH GFR (Completed)   CBC with Differential/Platelet (Completed)    Other Visit Diagnoses    Dizziness       Relevant Medications   meclizine (ANTIVERT) 25 MG tablet   Other Relevant Orders   BASIC METABOLIC PANEL WITH GFR (Completed)   CBC with Differential/Platelet (Completed)      Dizziness Uncertain exact etiology Likely multifactorial BP, medication, reduced eyesight, muscle/weakness balance Possible vertigo component again - will print handout Epley maneuver for home Check labs chemistry CBC Order Meclizine PRN  #LE Edema Stable to improved Taking furosemide PRN, not assoc with dizziness  #Alzheimer's Stable without significant decline in past 3 months  #Hypothyroidism Check labs TSH Free T4 Keep current dose.  Meds ordered this encounter  Medications  . meclizine (ANTIVERT) 25 MG tablet    Sig: Take 1 tablet (25 mg total) by mouth 3 (three) times daily as needed for dizziness.    Dispense:  30 tablet    Refill:  2      Follow up plan: Return in about 6 months (around 03/19/2021) for 4 month follow-up HTN, CKD Dizziness.   Nobie Putnam, [facility] 09/18/2020, 8:45 AM

## 2020-09-19 LAB — CBC WITH DIFFERENTIAL/PLATELET
Absolute Monocytes: 961 cells/uL — ABNORMAL HIGH (ref 200–950)
Basophils Absolute: 43 cells/uL (ref 0–200)
Basophils Relative: 0.5 %
Eosinophils Absolute: 77 cells/uL (ref 15–500)
Eosinophils Relative: 0.9 %
HCT: 45.8 % (ref 38.5–50.0)
Hemoglobin: 15.3 g/dL (ref 13.2–17.1)
Lymphs Abs: 2363 cells/uL (ref 850–3900)
MCH: 30 pg (ref 27.0–33.0)
MCHC: 33.4 g/dL (ref 32.0–36.0)
MCV: 89.8 fL (ref 80.0–100.0)
MPV: 12.2 fL (ref 7.5–12.5)
Monocytes Relative: 11.3 %
Neutro Abs: 5058 cells/uL (ref 1500–7800)
Neutrophils Relative %: 59.5 %
Platelets: 209 10*3/uL (ref 140–400)
RBC: 5.1 10*6/uL (ref 4.20–5.80)
RDW: 14 % (ref 11.0–15.0)
Total Lymphocyte: 27.8 %
WBC: 8.5 10*3/uL (ref 3.8–10.8)

## 2020-09-19 LAB — BASIC METABOLIC PANEL WITH GFR
BUN/Creatinine Ratio: 19 (calc) (ref 6–22)
BUN: 22 mg/dL (ref 7–25)
CO2: 27 mmol/L (ref 20–32)
Calcium: 9.6 mg/dL (ref 8.6–10.3)
Chloride: 106 mmol/L (ref 98–110)
Creat: 1.17 mg/dL — ABNORMAL HIGH (ref 0.70–1.11)
GFR, Est African American: 64 mL/min/{1.73_m2} (ref >=60)
GFR, Est Non African American: 55 mL/min/{1.73_m2} — ABNORMAL LOW (ref >=60)
Glucose, Bld: 78 mg/dL (ref 65–99)
Potassium: 4 mmol/L (ref 3.5–5.3)
Sodium: 141 mmol/L (ref 135–146)

## 2020-09-19 LAB — TSH: TSH: 0.69 mIU/L (ref 0.40–4.50)

## 2020-09-19 LAB — T4, FREE: Free T4: 1.8 ng/dL (ref 0.8–1.8)

## 2020-10-02 ENCOUNTER — Ambulatory Visit (INDEPENDENT_AMBULATORY_CARE_PROVIDER_SITE_OTHER): Payer: Medicare Other

## 2020-10-02 VITALS — Ht 67.0 in | Wt 150.0 lb

## 2020-10-02 DIAGNOSIS — Z0000 Encounter for general adult medical examination without abnormal findings: Secondary | ICD-10-CM

## 2020-10-02 NOTE — Progress Notes (Signed)
I connected with Manuel Reynolds today by telephone and verified that I am speaking with the correct person using two identifiers. Location patient: home Location provider: work Persons participating in the virtual visit: Manuel Reynolds, spouse Manuel Reynolds, Manuel Durand LPN.   I discussed the limitations, risks, security and privacy concerns of performing an evaluation and management service by telephone and the availability of in person appointments. I also discussed with the patient that there may be a patient responsible charge related to this service. The patient expressed understanding and verbally consented to this telephonic visit.    Interactive audio and video telecommunications were attempted between this provider and patient, however failed, due to patient having technical difficulties OR patient did not have access to video capability.  We continued and completed visit with audio only.     Vital signs may be patient reported or missing.  Subjective:   Manuel Reynolds is a 86 y.o. male who presents for Medicare Annual/Subsequent preventive examination.  Review of Systems     Cardiac Risk Factors include: advanced age (>85men, >44 women);hypertension;male gender     Objective:    Today's Vitals   10/02/20 1059 10/02/20 1100  Weight: 150 lb (68 kg)   Height: 5\' 7"  (1.702 m)   PainSc:  1    Body mass index is 23.49 kg/m.  Advanced Directives 10/02/2020 09/13/2019 07/13/2018 06/03/2018 06/16/2017 06/01/2017 05/26/2016  Does Patient Have a Medical Advance Directive? Yes Yes Yes Yes Yes Yes Yes  Type of Academic librarian Living will;Healthcare Power of Attorney Living will;Healthcare [place];Living will - -  Copy of [place] in Chart? No - copy requested No - copy requested No - copy requested - No - copy requested - -    Current Medications (verified) Outpatient Encounter Medications as of  10/02/2020  Medication Sig  . acebutolol (SECTRAL) 200 MG capsule TAKE 1 CAPSULE(200 MG) BY MOUTH DAILY  . Cholecalciferol (VITAMIN D3) 10 MCG (400 UNIT) tablet   . dorzolamide-timolol (COSOPT) 22.3-6.8 MG/ML ophthalmic solution Place 1 drop into both eyes 2 (two) times daily.  . furosemide (LASIX) 20 MG tablet TAKE 1 TABLET(20 MG) BY MOUTH DAILY AS NEEDED FOR FLUID RETENTION OR SWELLING  . latanoprost (XALATAN) 0.005 % ophthalmic solution 1 drop at bedtime.  Marland Kitchen levothyroxine (SYNTHROID) 50 MCG tablet TAKE 1 TABLET(50 MCG) BY MOUTH DAILY BEFORE BREAKFAST  . losartan (COZAAR) 50 MG tablet TAKE 1 TABLET(50 MG) BY MOUTH DAILY  . meclizine (ANTIVERT) 25 MG tablet Take 1 tablet (25 mg total) by mouth 3 (three) times daily as needed for dizziness.  . Melatonin 5 MG TABS Take 2.5 mg by mouth at bedtime.  . polyethylene glycol powder (GLYCOLAX/MIRALAX) 17 GM/SCOOP powder Take 17 g by mouth daily as needed. Use half or whole daily, or can use every other day, goal for prevention  . Probiotic Product (PROBIOTIC-10 PO) Take by mouth.  . simvastatin (ZOCOR) 40 MG tablet TAKE 1 TABLET(40 MG) BY MOUTH DAILY  . VIAGRA 100 MG tablet TAKE 1/2 TABLET BY MOUTH AS NEEDED FOR ERECTILE DYSFUNCTION  . VITAMIN D PO Take by mouth.   No facility-administered encounter medications on file as of 10/02/2020.    Allergies (verified) Other, Sulfa antibiotics, and Sulfacetamide sodium   History: Past Medical History:  Diagnosis Date  . Anxiety   . Hypertension   . 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 Thyroid disease    Past  Surgical History:  Procedure Laterality Date  . EYE SURGERY  2010   cataract   Family History  Problem Relation Age of Onset  . Hypertension Father   . Hypertension Mother   . Lung cancer Sister   . Diabetes Maternal Aunt   . Diabetes Maternal Uncle   . Diabetes Maternal Aunt   . Diabetes Maternal Uncle    Social History   Socioeconomic History  . Marital  status: Married    Spouse name: Not on file  . Number of children: Not on file  . Years of education: Not on file  . Highest education level: Master's degree (e.g., MA, MS, MEng, MEd, MSW, MBA)  Occupational History  . Occupation: retired  Tobacco Use  . Smoking status: Never Smoker  . Smokeless tobacco: Never Used  Vaping Use  . Vaping Use: Never used  Substance and Sexual Activity  . Alcohol use: No  . Drug use: No  . Sexual activity: Not on file  Other Topics Concern  . Not on file  Social History Narrative  . Not on file   Social Determinants of Health   Financial Resource Strain: Low Risk   . Difficulty of Paying Living Expenses: Not hard at all  Food Insecurity: No Food Insecurity  . Worried About Charity fundraiser in the Last Year: Never true  . Ran Out of Food in the Last Year: Never true  Transportation Needs: No Transportation Needs  . Lack of Transportation (Medical): No  . Lack of Transportation (Non-Medical): No  Physical Activity: Insufficiently Active  . Days of Exercise per Week: 7 days  . Minutes of Exercise per Session: 10 min  Stress: No Stress Concern Present  . Feeling of Stress : Not at all  Social Connections:   . Frequency of Communication with Friends and Family: Not on file  . Frequency of Social Gatherings with Friends and Family: Not on file  . Attends Religious Services: Not on file  . Active Member of Clubs or Organizations: Not on file  . Attends Archivist Meetings: Not on file  . Marital Status: Not on file    Tobacco Counseling Counseling given: Not Answered   Clinical Intake:  Pre-visit preparation completed: Yes  Pain : 0-10 Pain Score: 1  Pain Type: Chronic pain Pain Orientation: Right, Reynolds Pain Descriptors / Indicators: Other (Comment) (itchy) Pain Onset: More than a month ago Pain Frequency: Intermittent Pain Relieving Factors: fluid pills help some  Pain Relieving Factors: fluid pills help  some  Nutritional Status: BMI of 19-24  Normal Nutritional Risks: None Diabetes: No  How often do you need to have someone help you when you read instructions, pamphlets, or other written materials from your doctor or pharmacy?: 1 - Never What is the last grade level you completed in school?: master's degree  Diabetic? no  Interpreter Needed?: No  Information entered by :: NAllen LPN   Activities of Daily Living In your present state of health, do you have any difficulty performing the following activities: 10/02/2020 03/13/2020  Hearing? Tempie Donning  Vision? N Y  Difficulty concentrating or making decisions? Y Y  Comment has dementia -  Walking or climbing stairs? N N  Dressing or bathing? Y N  Comment some assistance with bathing, Supervision -  Doing errands, shopping? Y Y  Comment always has someone with him -  Using the Toilet? N -  In the past six months, have you accidently leaked urine? N -  Do you have problems with loss of bowel control? N -  Managing your Medications? Y -  Comment meds are set up for him -  Managing your Finances? Y -  Housekeeping or managing your Housekeeping? Y -  Some recent data might be hidden    Patient Care Team: [name], DO as PCP - General (Family Medicine) Oneta Rack, MD (Dermatology) Lequita Asal, MD as Referring Physician (Hematology and Oncology) Edrick Kins, DPM as Consulting Physician (Podiatry) Leandrew Koyanagi, MD as Referring Physician (Ophthalmology)  Indicate any recent Medical Services you may have received from other than [facility] providers in the past year (date may be approximate).     Assessment:   This is a routine wellness examination for Keontae.  Hearing/Vision screen  Hearing Screening   125Hz  250Hz  500Hz  1000Hz  2000Hz  3000Hz  4000Hz  6000Hz  8000Hz   Right ear:           Reynolds ear:           Vision Screening Comments: Regular eye exams, [place]  Dietary issues and exercise  activities discussed: Current Exercise Habits: Home exercise routine, Type of exercise: walking, Time (Minutes): 10, Frequency (Times/Week): 7, Weekly Exercise (Minutes/Week): 70  Goals    . Increase water intake     Recommend drinking at least 6-8 glasses of water a day     . Patient Stated     10/02/2020, stay mobile as long as he can      Depression Screen PHQ 2/9 Scores 10/02/2020 06/27/2020 03/13/2020 03/13/2020 09/13/2019 07/28/2019 01/17/2019  PHQ - 2 Score 0 0 3 2 0 0 0  PHQ- 9 Score - - 12 5 - - -    Fall Risk Fall Risk  10/02/2020 03/13/2020 09/13/2019 09/13/2019 01/17/2019  Falls in the past year? 1 0 0 0 0  Comment loses balance - - - -  Number falls in past yr: 1 0 - 0 0  Injury with Fall? 0 0 - 0 0  Risk for fall due to : Impaired balance/gait;Impaired mobility;Medication side effect;History of fall(s) - - - -  Follow up Falls evaluation completed;Education provided;Falls prevention discussed - Falls evaluation completed - -    Any stairs in or around the home? No  If so, are there any without handrails? n/a Home free of loose throw rugs in walkways, pet beds, electrical cords, etc? Yes  Adequate lighting in your home to reduce risk of falls? Yes   ASSISTIVE DEVICES UTILIZED TO PREVENT FALLS:  Life alert? Yes  Use of a cane, walker or w/c? Yes  Grab bars in the bathroom? Yes  Shower chair or bench in shower? Yes  Elevated toilet seat or a handicapped toilet? Yes   TIMED UP AND GO:  Was the test performed? No .       Cognitive Function:     6CIT Screen 09/13/2019 07/13/2018 06/16/2017  What Year? 0 points 0 points 0 points  What month? 0 points 0 points 0 points  What time? 0 points 0 points 0 points  Count back from 20 0 points 0 points 0 points  Months in reverse 0 points 0 points 0 points  Repeat phrase 0 points 0 points 0 points  Total Score 0 0 0    Immunizations Immunization History  Administered Date(s) Administered  . Fluad Quad(high Dose 65+)  07/28/2019  . Influenza, High Dose Seasonal PF 08/17/2015, 09/02/2018  . Influenza-Unspecified 08/18/2016, 08/21/2020  . PFIZER SARS-COV-2 Vaccination 12/08/2019, 12/29/2019,  08/29/2020  . PPD Test 04/10/2020  . Pneumococcal Conjugate-13 10/08/2007  . Pneumococcal Polysaccharide-23 11/03/2013  . Zoster 11/03/2013    TDAP status: Up to date Flu Vaccine status: Up to date Pneumococcal vaccine status: Up to date Covid-19 vaccine status: Completed vaccines  Qualifies for Shingles Vaccine? Yes   Zostavax completed Yes   Shingrix Completed?: No.    Education has been provided regarding the importance of this vaccine. Patient has been advised to call insurance company to determine out of pocket expense if they have not yet received this vaccine. Advised may also receive vaccine at local pharmacy or Health Dept. Verbalized acceptance and understanding.  Screening Tests Health Maintenance  Topic Date Due  . TETANUS/TDAP  09/16/2025  . INFLUENZA VACCINE  Completed  . COVID-19 Vaccine  Completed  . PNA vac Low Risk Adult  Completed    Health Maintenance  There are no preventive care reminders to display for this patient.  Colorectal cancer screening: No longer required.   Lung Cancer Screening: (Low Dose CT Chest recommended if Age 24-80 years, 30 pack-year currently smoking OR have quit w/in 15years.) does not qualify.   Lung Cancer Screening Referral: no  Additional Screening:  Hepatitis C Screening: does not qualify;   Vision Screening: Recommended annual ophthalmology exams for early detection of glaucoma and other disorders of the eye. Is the patient up to date with their annual eye exam?  Yes  Who is the provider or what is the name of the office in which the patient attends annual eye exams? [facility] If pt is not established with a provider, would they like to be referred to a provider to establish care? No .   Dental Screening: Recommended annual dental exams  for proper oral hygiene  Community Resource Referral / Chronic Care Management: CRR required this visit?  No   CCM required this visit?  No      Plan:     I have personally reviewed and noted the following in the patient's chart:   . Medical and social history . Use of alcohol, tobacco or illicit drugs  . Current medications and supplements . Functional ability and status . Nutritional status . Physical activity . Advanced directives . List of other physicians . Hospitalizations, surgeries, and ER visits in previous 12 months . Vitals . Screenings to include cognitive, depression, and falls . Referrals and appointments  In addition, I have reviewed and discussed with patient certain preventive protocols, quality metrics, and best practice recommendations. A written personalized care plan for preventive services as well as general preventive health recommendations were provided to patient.     Kellie Simmering, LPN   [id number]/[id number]   Nurse Notes: 6 CIT not administered. Patient has a diagnosis of Alzheimer's. He is followed neurology.

## 2020-10-02 NOTE — Patient Instructions (Signed)
Manuel Reynolds , Thank you for taking time to come for your Medicare Wellness Visit. I appreciate your ongoing commitment to your health goals. Please review the following plan we discussed and let me know if I can assist you in the future.   Screening recommendations/referrals: Colonoscopy: not required Recommended yearly ophthalmology/optometry visit for glaucoma screening and checkup Recommended yearly dental visit for hygiene and checkup  Vaccinations: Influenza vaccine: completed 08/21/2020 Pneumococcal vaccine: completed 11/03/2013 Tdap vaccine: completed 09/17/2015, due 09/16/2025 Shingles vaccine: discussed   Covid-19:  08/29/2020, 12/29/2019, 12/08/2019  Advanced directives: Please bring a copy of your POA (Power of Attorney) and/or Living Will to your next appointment.   Conditions/risks identified: none  Next appointment: Follow up in one year for your annual wellness visit.   Preventive Care 84 Years and Older, Male Preventive care refers to lifestyle choices and visits with your health care provider that can promote health and wellness. What does preventive care include?  A yearly physical exam. This is also called an annual well check.  Dental exams once or twice a year.  Routine eye exams. Ask your health care provider how often you should have your eyes checked.  Personal lifestyle choices, including:  Daily care of your teeth and gums.  Regular physical activity.  Eating a healthy diet.  Avoiding tobacco and drug use.  Limiting alcohol use.  Practicing safe sex.  Taking low doses of aspirin every day.  Taking vitamin and mineral supplements as recommended by your health care provider. What happens during an annual well check? The services and screenings done by your health care provider during your annual well check will depend on your age, overall health, lifestyle risk factors, and family history of disease. Counseling  Your health care provider may ask  you questions about your:  Alcohol use.  Tobacco use.  Drug use.  Emotional well-being.  Home and relationship well-being.  Sexual activity.  Eating habits.  History of falls.  Memory and ability to understand (cognition).  Work and work Statistician. Screening  You may have the following tests or measurements:  Height, weight, and BMI.  Blood pressure.  Lipid and cholesterol levels. These may be checked every 5 years, or more frequently if you are over 78 years old.  Skin check.  Lung cancer screening. You may have this screening every year starting at age 64 if you have a 30-pack-year history of smoking and currently smoke or have quit within the past 15 years.  Fecal occult blood test (FOBT) of the stool. You may have this test every year starting at age 40.  Flexible sigmoidoscopy or colonoscopy. You may have a sigmoidoscopy every 5 years or a colonoscopy every 10 years starting at age 45.  Prostate cancer screening. Recommendations will vary depending on your family history and other risks.  Hepatitis C blood test.  Hepatitis B blood test.  Sexually transmitted disease (STD) testing.  Diabetes screening. This is done by checking your blood sugar (glucose) after you have not eaten for a while (fasting). You may have this done every 1-3 years.  Abdominal aortic aneurysm (AAA) screening. You may need this if you are a current or former smoker.  Osteoporosis. You may be screened starting at age 77 if you are at high risk. Talk with your health care provider about your test results, treatment options, and if necessary, the need for more tests. Vaccines  Your health care provider may recommend certain vaccines, such as:  Influenza vaccine. This is recommended  every year.  Tetanus, diphtheria, and acellular pertussis (Tdap, Td) vaccine. You may need a Td booster every 10 years.  Zoster vaccine. You may need this after age 39.  Pneumococcal 13-valent conjugate  (PCV13) vaccine. One dose is recommended after age 16.  Pneumococcal polysaccharide (PPSV23) vaccine. One dose is recommended after age 70. Talk to your health care provider about which screenings and vaccines you need and how often you need them. This information is not intended to replace advice given to you by your health care provider. Make sure you discuss any questions you have with your health care provider. Document Released: 12/07/2015 Document Revised: 07/30/2016 Document Reviewed: 09/11/2015 Elsevier Interactive Patient Education  2017 [place] Prevention in the Home Falls can cause injuries. They can happen to people of all ages. There are many things you can do to make your home safe and to help prevent falls. What can I do on the outside of my home?  Regularly fix the edges of walkways and driveways and fix any cracks.  Remove anything that might make you trip as you walk through a door, such as a raised step or threshold.  Trim any bushes or trees on the path to your home.  Use bright outdoor lighting.  Clear any walking paths of anything that might make someone trip, such as rocks or tools.  Regularly check to see if handrails are loose or broken. Make sure that both sides of any steps have handrails.  Any raised decks and porches should have guardrails on the edges.  Have any leaves, snow, or ice cleared regularly.  Use sand or salt on walking paths during winter.  Clean up any spills in your garage right away. This includes oil or grease spills. What can I do in the bathroom?  Use night lights.  Install grab bars by the toilet and in the tub and shower. Do not use towel bars as grab bars.  Use non-skid mats or decals in the tub or shower.  If you need to sit down in the shower, use a plastic, non-slip stool.  Keep the floor dry. Clean up any water that spills on the floor as soon as it happens.  Remove soap buildup in the tub or shower  regularly.  Attach bath mats securely with double-sided non-slip rug tape.  Do not have throw rugs and other things on the floor that can make you trip. What can I do in the bedroom?  Use night lights.  Make sure that you have a light by your bed that is easy to reach.  Do not use any sheets or blankets that are too big for your bed. They should not hang down onto the floor.  Have a firm chair that has side arms. You can use this for support while you get dressed.  Do not have throw rugs and other things on the floor that can make you trip. What can I do in the kitchen?  Clean up any spills right away.  Avoid walking on wet floors.  Keep items that you use a lot in easy-to-reach places.  If you need to reach something above you, use a strong step stool that has a grab bar.  Keep electrical cords out of the way.  Do not use floor polish or wax that makes floors slippery. If you must use wax, use non-skid floor wax.  Do not have throw rugs and other things on the floor that can make you trip. What  can I do with my stairs?  Do not leave any items on the stairs.  Make sure that there are handrails on both sides of the stairs and use them. Fix handrails that are broken or loose. Make sure that handrails are as long as the stairways.  Check any carpeting to make sure that it is firmly attached to the stairs. Fix any carpet that is loose or worn.  Avoid having throw rugs at the top or bottom of the stairs. If you do have throw rugs, attach them to the floor with carpet tape.  Make sure that you have a light switch at the top of the stairs and the bottom of the stairs. If you do not have them, ask someone to add them for you. What else can I do to help prevent falls?  Wear shoes that:  Do not have high heels.  Have rubber bottoms.  Are comfortable and fit you well.  Are closed at the toe. Do not wear sandals.  If you use a stepladder:  Make sure that it is fully  opened. Do not climb a closed stepladder.  Make sure that both sides of the stepladder are locked into place.  Ask someone to hold it for you, if possible.  Clearly mark and make sure that you can see:  Any grab bars or handrails.  First and last steps.  Where the edge of each step is.  Use tools that help you move around (mobility aids) if they are needed. These include:  Canes.  Walkers.  Scooters.  Crutches.  Turn on the lights when you go into a dark area. Replace any light bulbs as soon as they burn out.  Set up your furniture so you have a clear path. Avoid moving your furniture around.  If any of your floors are uneven, fix them.  If there are any pets around you, be aware of where they are.  Review your medicines with your doctor. Some medicines can make you feel dizzy. This can increase your chance of falling. Ask your doctor what other things that you can do to help prevent falls. This information is not intended to replace advice given to you by your health care provider. Make sure you discuss any questions you have with your health care provider. Document Released: 09/06/2009 Document Revised: 04/17/2016 Document Reviewed: 12/15/2014 Elsevier Interactive Patient Education  2017 [employer].

## 2020-10-09 ENCOUNTER — Other Ambulatory Visit: Payer: Self-pay | Admitting: Family Medicine

## 2020-10-09 DIAGNOSIS — N529 Male erectile dysfunction, unspecified: Secondary | ICD-10-CM

## 2020-10-09 NOTE — Telephone Encounter (Signed)
Requested medication (s) are due for refill today: Yes  Requested medication (s) are on the active medication list: Yes  Last refill:  04/06/19  Future visit scheduled: Yes  Notes to clinic:  Prescription has expired.    Requested Prescriptions  Pending Prescriptions Disp Refills   VIAGRA 100 MG tablet [Pharmacy Med Name: VIAGRA 100MG  TABLETS] 6 tablet 5    Sig: TAKE 1/2 TABLET BY MOUTH EVERY DAY AS NEEDED FOR ERECTILE DYSFUNCTION      Urology: Erectile Dysfunction Agents Failed - 10/09/2020 10:09 AM      Failed - Last BP in normal range    BP Readings from Last 1 Encounters:  09/18/20 (!) 148/86          Passed - Valid encounter within last 12 months    Recent Outpatient Visits           3 weeks ago Benign hypertension with CKD (chronic kidney disease) stage III [facility])   [place], DO   3 months ago Bilateral lower extremity edema   [place], DO   7 months ago Stage 3a chronic kidney disease   [facility] [name], DO   9 months ago Other insomnia   [facility] [name], DO   10 months ago Confusion   [place], DO       Future Appointments             In 3 months [place] Ranger, [name], [facility], [place]   In 12 months  [facility], [facility]

## 2020-10-24 ENCOUNTER — Other Ambulatory Visit: Payer: Self-pay | Admitting: Family Medicine

## 2020-10-24 DIAGNOSIS — N529 Male erectile dysfunction, unspecified: Secondary | ICD-10-CM

## 2020-10-24 NOTE — Telephone Encounter (Signed)
Medication Refill - Medication: VIAGRA 100 MG tablet (Patient would like the generic version of medication called into pharmacy to save money.)   Has the patient contacted their pharmacy? yes (Agent: If no, request that the patient contact the pharmacy for the refill.) (Agent: If yes, when and what did the pharmacy advise?)Contact PCP  Preferred Pharmacy (with phone number or street name):  [id number] [address], [place] Phone:  [phone]  Fax:  [phone]       Agent: Please be advised that RX refills may take up to 3 business days. We ask that you follow-up with your pharmacy.

## 2020-10-24 NOTE — Telephone Encounter (Signed)
Requested medication (s) are due for refill today: Yes  Requested medication (s) are on the active medication list: Yes  Last refill:  2 weeks ago  Future visit scheduled: Yes  Notes to clinic:  Patient wants generic to save money     Requested Prescriptions  Pending Prescriptions Disp Refills   VIAGRA 100 MG tablet 6 tablet 5      Urology: Erectile Dysfunction Agents Failed - 10/24/2020  5:02 PM      Failed - Last BP in normal range    BP Readings from Last 1 Encounters:  09/18/20 (!) 148/86          Passed - Valid encounter within last 12 months    Recent Outpatient Visits           1 month ago Benign hypertension with CKD (chronic kidney disease) stage III [place])   [facility] [name], DO   3 months ago Bilateral lower extremity edema   [place], DO   7 months ago Stage 3a chronic kidney disease   [facility] [name], DO   10 months ago Other insomnia   [facility] [name], DO   10 months ago Confusion   [place], DO       Future Appointments             In 2 months Parks Ranger, [name], DO [facility], [place]   In 11 months  [facility], [facility]

## 2020-10-25 ENCOUNTER — Ambulatory Visit: Payer: Medicare Other | Admitting: Podiatry

## 2020-10-25 MED ORDER — SILDENAFIL CITRATE 100 MG PO TABS: 100.0000 mg | ORAL_TABLET | ORAL | 5 refills | Status: AC | PRN

## 2020-10-31 DIAGNOSIS — C44321 Squamous cell carcinoma of skin of nose: Secondary | ICD-10-CM | POA: Diagnosis not present

## 2020-10-31 DIAGNOSIS — D0439 Carcinoma in situ of skin of other parts of face: Secondary | ICD-10-CM | POA: Diagnosis not present

## 2020-12-09 ENCOUNTER — Inpatient Hospital Stay
Admission: EM | Admit: 2020-12-09 | Discharge: 2020-12-18 | DRG: 291 | Disposition: A | Discharge status: Skilled Nursing Facility | Payer: Medicare Other | Attending: Internal Medicine | Admitting: Internal Medicine

## 2020-12-09 ENCOUNTER — Emergency Department: Payer: Medicare Other

## 2020-12-09 ENCOUNTER — Other Ambulatory Visit: Payer: Self-pay

## 2020-12-09 DIAGNOSIS — G309 Alzheimer's disease, unspecified: Secondary | ICD-10-CM | POA: Diagnosis present

## 2020-12-09 DIAGNOSIS — J9601 Acute respiratory failure with hypoxia: Principal | ICD-10-CM | POA: Diagnosis present

## 2020-12-09 DIAGNOSIS — G9341 Metabolic encephalopathy: Secondary | ICD-10-CM | POA: Diagnosis not present

## 2020-12-09 DIAGNOSIS — Z882 Allergy status to sulfonamides status: Secondary | ICD-10-CM

## 2020-12-09 DIAGNOSIS — E1165 Type 2 diabetes mellitus with hyperglycemia: Secondary | ICD-10-CM | POA: Diagnosis not present

## 2020-12-09 DIAGNOSIS — R404 Transient alteration of awareness: Secondary | ICD-10-CM | POA: Diagnosis not present

## 2020-12-09 DIAGNOSIS — J189 Pneumonia, unspecified organism: Secondary | ICD-10-CM

## 2020-12-09 DIAGNOSIS — Z20822 Contact with and (suspected) exposure to covid-19: Secondary | ICD-10-CM | POA: Diagnosis present

## 2020-12-09 DIAGNOSIS — Z66 Do not resuscitate: Secondary | ICD-10-CM | POA: Diagnosis present

## 2020-12-09 DIAGNOSIS — I5031 Acute diastolic (congestive) heart failure: Secondary | ICD-10-CM | POA: Diagnosis present

## 2020-12-09 DIAGNOSIS — I110 Hypertensive heart disease with heart failure: Secondary | ICD-10-CM | POA: Diagnosis not present

## 2020-12-09 DIAGNOSIS — E039 Hypothyroidism, unspecified: Secondary | ICD-10-CM | POA: Diagnosis present

## 2020-12-09 DIAGNOSIS — N179 Acute kidney failure, unspecified: Secondary | ICD-10-CM | POA: Diagnosis not present

## 2020-12-09 DIAGNOSIS — I161 Hypertensive emergency: Secondary | ICD-10-CM | POA: Diagnosis present

## 2020-12-09 DIAGNOSIS — J90 Pleural effusion, not elsewhere classified: Secondary | ICD-10-CM | POA: Diagnosis not present

## 2020-12-09 DIAGNOSIS — I130 Hypertensive heart and chronic kidney disease with heart failure and stage 1 through stage 4 chronic kidney disease, or unspecified chronic kidney disease: Principal | ICD-10-CM | POA: Diagnosis present

## 2020-12-09 DIAGNOSIS — R0600 Dyspnea, unspecified: Secondary | ICD-10-CM | POA: Diagnosis not present

## 2020-12-09 DIAGNOSIS — R471 Dysarthria and anarthria: Secondary | ICD-10-CM | POA: Diagnosis present

## 2020-12-09 DIAGNOSIS — I674 Hypertensive encephalopathy: Secondary | ICD-10-CM | POA: Diagnosis present

## 2020-12-09 DIAGNOSIS — E785 Hyperlipidemia, unspecified: Secondary | ICD-10-CM | POA: Diagnosis present

## 2020-12-09 DIAGNOSIS — J9621 Acute and chronic respiratory failure with hypoxia: Secondary | ICD-10-CM

## 2020-12-09 DIAGNOSIS — Z515 Encounter for palliative care: Secondary | ICD-10-CM | POA: Diagnosis not present

## 2020-12-09 DIAGNOSIS — J80 Acute respiratory distress syndrome: Secondary | ICD-10-CM | POA: Diagnosis not present

## 2020-12-09 DIAGNOSIS — E876 Hypokalemia: Secondary | ICD-10-CM | POA: Diagnosis not present

## 2020-12-09 DIAGNOSIS — Z79890 Hormone replacement therapy (postmenopausal): Secondary | ICD-10-CM

## 2020-12-09 DIAGNOSIS — E870 Hyperosmolality and hypernatremia: Secondary | ICD-10-CM | POA: Diagnosis present

## 2020-12-09 DIAGNOSIS — C8590 Non-Hodgkin lymphoma, unspecified, unspecified site: Secondary | ICD-10-CM | POA: Diagnosis present

## 2020-12-09 DIAGNOSIS — I509 Heart failure, unspecified: Secondary | ICD-10-CM

## 2020-12-09 DIAGNOSIS — R1310 Dysphagia, unspecified: Secondary | ICD-10-CM | POA: Diagnosis present

## 2020-12-09 DIAGNOSIS — N1831 Chronic kidney disease, stage 3a: Secondary | ICD-10-CM | POA: Diagnosis present

## 2020-12-09 DIAGNOSIS — F0280 Dementia in other diseases classified elsewhere without behavioral disturbance: Secondary | ICD-10-CM | POA: Diagnosis present

## 2020-12-09 DIAGNOSIS — Z79899 Other long term (current) drug therapy: Secondary | ICD-10-CM

## 2020-12-09 DIAGNOSIS — R0602 Shortness of breath: Secondary | ICD-10-CM | POA: Diagnosis not present

## 2020-12-09 DIAGNOSIS — F05 Delirium due to known physiological condition: Secondary | ICD-10-CM | POA: Diagnosis not present

## 2020-12-09 DIAGNOSIS — R0902 Hypoxemia: Secondary | ICD-10-CM | POA: Diagnosis not present

## 2020-12-09 DIAGNOSIS — F419 Anxiety disorder, unspecified: Secondary | ICD-10-CM | POA: Diagnosis present

## 2020-12-09 DIAGNOSIS — Z8249 Family history of ischemic heart disease and other diseases of the circulatory system: Secondary | ICD-10-CM

## 2020-12-09 DIAGNOSIS — Z7189 Other specified counseling: Secondary | ICD-10-CM

## 2020-12-09 DIAGNOSIS — R531 Weakness: Secondary | ICD-10-CM

## 2020-12-09 DIAGNOSIS — J810 Acute pulmonary edema: Secondary | ICD-10-CM | POA: Diagnosis present

## 2020-12-09 LAB — PROTIME-INR
INR: 1.1 (ref 0.8–1.2)
Prothrombin Time: 14.1 seconds (ref 11.4–15.2)

## 2020-12-09 LAB — BASIC METABOLIC PANEL
Anion gap: 11 (ref 5–15)
BUN: 31 mg/dL — ABNORMAL HIGH (ref 8–23)
CO2: 23 mmol/L (ref 22–32)
Calcium: 8.7 mg/dL — ABNORMAL LOW (ref 8.9–10.3)
Chloride: 109 mmol/L (ref 98–111)
Creatinine, Ser: 1.15 mg/dL (ref 0.61–1.24)
GFR, Estimated: >60 mL/min (ref >60)
Glucose, Bld: 217 mg/dL — ABNORMAL HIGH (ref 70–99)
Potassium: 4.2 mmol/L (ref 3.5–5.1)
Sodium: 143 mmol/L (ref 135–145)

## 2020-12-09 LAB — CBC WITH DIFFERENTIAL/PLATELET
Abs Immature Granulocytes: 0.06 10*3/uL (ref 0.00–0.07)
Basophils Absolute: 0.1 10*3/uL (ref 0.0–0.1)
Basophils Relative: 0 %
Eosinophils Absolute: 0.2 10*3/uL (ref 0.0–0.5)
Eosinophils Relative: 1 %
HCT: 45.8 % (ref 39.0–52.0)
Hemoglobin: 14.7 g/dL (ref 13.0–17.0)
Immature Granulocytes: 0 %
Lymphocytes Relative: 35 %
Lymphs Abs: 4.8 10*3/uL — ABNORMAL HIGH (ref 0.7–4.0)
MCH: 28.5 pg (ref 26.0–34.0)
MCHC: 32.1 g/dL (ref 30.0–36.0)
MCV: 88.9 fL (ref 80.0–100.0)
Monocytes Absolute: 1.7 10*3/uL — ABNORMAL HIGH (ref 0.1–1.0)
Monocytes Relative: 12 %
Neutro Abs: 7.2 10*3/uL (ref 1.7–7.7)
Neutrophils Relative %: 52 %
Platelets: 277 10*3/uL (ref 150–400)
RBC: 5.15 MIL/uL (ref 4.22–5.81)
RDW: 15.6 % — ABNORMAL HIGH (ref 11.5–15.5)
WBC: 14 10*3/uL — ABNORMAL HIGH (ref 4.0–10.5)
nRBC: 0 % (ref 0.0–0.2)

## 2020-12-09 LAB — RESP PANEL BY RT-PCR (FLU A&B, COVID) ARPGX2
Influenza A by PCR: NEGATIVE
Influenza B by PCR: NEGATIVE
SARS Coronavirus 2 by RT PCR: NEGATIVE

## 2020-12-09 LAB — POC SARS CORONAVIRUS 2 AG -  ED: SARS Coronavirus 2 Ag: NEGATIVE

## 2020-12-09 LAB — BRAIN NATRIURETIC PEPTIDE: B Natriuretic Peptide: 2103.1 pg/mL — ABNORMAL HIGH (ref 0.0–100.0)

## 2020-12-09 LAB — TROPONIN I (HIGH SENSITIVITY): Troponin I (High Sensitivity): 46 ng/L — ABNORMAL HIGH (ref <18)

## 2020-12-09 MED ORDER — FUROSEMIDE 10 MG/ML IJ SOLN
60.0000 mg | Freq: Once | INTRAMUSCULAR | Status: AC
  Administered 2020-12-09: 60 mg via INTRAVENOUS
  Filled 2020-12-09: qty 8

## 2020-12-09 MED ORDER — SODIUM CHLORIDE 0.9 % IV SOLN
500.0000 mg | INTRAVENOUS | Status: DC
  Administered 2020-12-09 – 2020-12-10 (×2): 500 mg via INTRAVENOUS
  Filled 2020-12-09 (×3): qty 500

## 2020-12-09 MED ORDER — SODIUM CHLORIDE 0.9 % IV SOLN
2.0000 g | INTRAVENOUS | Status: DC
  Administered 2020-12-09 – 2020-12-10 (×2): 2 g via INTRAVENOUS
  Filled 2020-12-09 (×2): qty 20

## 2020-12-09 NOTE — H&P (Addendum)
History and Physical    Manuel Reynolds ASN:[id number] DOB: December 21, 1929 DOA: 12/09/2020  PCP: [name], DO  Patient coming from: Home  I have personally briefly reviewed patient's old medical records in [place]  Chief Complaint: acute sob   HPI: Manuel Reynolds is a 87 y.o. male with medical history significant of  Dementia Alzheimer's, anxiety, hypothyroidism, Lymphoma in remission,Hypertension,b/l lower extremity edema on prn lasix., CKDIII who presents to ed BIB EMS on NRB 15L/min sat 95%.Of note patient is a poor historian and history is taken from chart. Per chart patient has acute shortness of breath that started immediately prior to presentation. Per wife patient complained of increase sob while eating dinner. She also noted per ED MD that she heard crackling noises coming from his chest. Due to continued symptoms x 1 hour she called EMS.  In the field per noted patient sat on arrival of EMS was 60% on ra. Of note ed coarse complicated by episode of agitation, with patient pulling of bipap he was treated with one dose of droperidol 2.5mg  iv.    ED Course:  T:99.1,BP153/102, hr 92, sat 95% on NRB Labs: Wbc 14,  hgb 14.7,plt277, Na 143, k4.2, cl109, glu217, cr 1.15 at baseline, CE 46, BNP 2103 EKG: sinus tachycardia , pvc , LAFB, q in III,AVF Respiratory panel negative  Lactic 1.5. Cxr: 1. Features suggestive of CHF/volume overload with pulmonary edema and bilateral effusions. 2. More patchy and coalescent opacities could reflect developing alveolar edema though underlying infection is difficult to exclude. 3. Enlarged cardiac silhouette, possibly cardiomegaly or pericardial Effusion.  Tx: lasix 60 mg x 1 iv , ceftriaxone/ azithromycin   Review of Systems: As per HPI otherwise unable to obtain further ROS due to dementia as well as acute medical status  Past Medical History:  Diagnosis Date  . Anxiety   . Hypertension   . 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 Thyroid disease     Past Surgical History:  Procedure Laterality Date  . EYE SURGERY  2010   cataract     reports that he has never smoked. He has never used smokeless tobacco. He reports that he does not drink alcohol and does not use drugs.  Allergies  Allergen Reactions  . Other Rash  . Sulfa Antibiotics Rash  . Sulfacetamide Sodium Rash    Family History  Problem Relation Age of Onset  . Hypertension Father   . Hypertension Mother   . Lung cancer Sister   . Diabetes Maternal Aunt   . Diabetes Maternal Uncle   . Diabetes Maternal Aunt   . Diabetes Maternal Uncle     Prior to Admission medications   Medication Sig Start Date End Date Taking? Authorizing Provider  acebutolol (SECTRAL) 200 MG capsule TAKE 1 CAPSULE(200 MG) BY MOUTH DAILY 08/27/20   Karamalegos, [name], DO  Cholecalciferol (VITAMIN D3) 10 MCG (400 UNIT) tablet     [provider]  dorzolamide-timolol (COSOPT) 22.3-6.8 MG/ML ophthalmic solution Place 1 drop into both eyes 2 (two) times daily.    [provider]  furosemide (LASIX) 20 MG tablet TAKE 1 TABLET(20 MG) BY MOUTH DAILY AS NEEDED FOR FLUID RETENTION OR SWELLING 08/28/20   Karamalegos, [name], DO  latanoprost (XALATAN) 0.005 % ophthalmic solution 1 drop at bedtime.    [provider]  levothyroxine (SYNTHROID) 50 MCG tablet TAKE 1 TABLET(50 MCG) BY MOUTH DAILY BEFORE BREAKFAST 08/08/20   Karamalegos, [name], DO  losartan (COZAAR)  50 MG tablet TAKE 1 TABLET(50 MG) BY MOUTH DAILY 08/03/20   Karamalegos, [name], DO  meclizine (ANTIVERT) 25 MG tablet Take 1 tablet (25 mg total) by mouth 3 (three) times daily as needed for dizziness. 09/18/20   Karamalegos, [name], DO  Melatonin 5 MG TABS Take 2.5 mg by mouth at bedtime.    [provider]  polyethylene glycol powder (GLYCOLAX/MIRALAX) 17 GM/SCOOP powder Take 17 g by mouth daily as needed. Use half or whole daily, or can  use every other day, goal for prevention    [provider]  Probiotic Product (PROBIOTIC-10 PO) Take by mouth.    [provider]  sildenafil (VIAGRA) 100 MG tablet Take 1 tablet (100 mg total) by mouth as needed for erectile dysfunction. 10/25/20   Verl Bangs, FNP  simvastatin (ZOCOR) 40 MG tablet TAKE 1 TABLET(40 MG) BY MOUTH DAILY 08/03/20   [place] Ranger, [name], DO  VITAMIN D PO Take by mouth.    [provider]    Physical Exam: Vitals:   12/09/20 2205 12/09/20 2220 12/09/20 2230 12/09/20 2300  BP: (!) 153/102  (!) 165/113 (!) 152/120  Pulse: 92  86 86  Resp:   (!) 30 (!) 36  Temp: 99.1 F (37.3 C)     TempSrc: Oral     SpO2: 95% 100% 97% 99%  Weight:      Height:         Vitals:   12/09/20 2205 12/09/20 2220 12/09/20 2230 12/09/20 2300  BP: (!) 153/102  (!) 165/113 (!) 152/120  Pulse: 92  86 86  Resp:   (!) 30 (!) 36  Temp: 99.1 F (37.3 C)     TempSrc: Oral     SpO2: 95% 100% 97% 99%  Weight:      Height:      Constitutional: BIPAP in place currently calm resting Eyes: pupils are equal, lids and conjunctivae normal ENMT: bIPAP a fixed unable to evaluate Neck: normal, supple, no masses, no thyromegaly Respiratory: crackles bilaterally, no wheezing,increase respiratory effort. some accessory muscle use.  Cardiovascular: Regular rate and rhythm, no murmurs / rubs / gallops. + extremity edema. + pedal pulses. Abdomen: no tenderness, no masses palpated. No hepatosplenomegaly. Bowel sounds positive.  Musculoskeletal: no clubbing / cyanosis. No joint deformity upper and lower extremities. Good ROM, no contractures. Normal muscle tone.  Skin: no rashes, lesions, ulcers. No induration Neurologic: CN 2-12 grossly intact. Sensation intact,. MAE Psychiatric: unable to assess, patient sedated s/p medication  Labs on Admission: I have personally reviewed following labs and imaging studies  CBC: Recent Labs  Lab 12/09/20 2207  WBC 14.0*   NEUTROABS 7.2  HGB 14.7  HCT 45.8  MCV 88.9  PLT 161   Basic Metabolic Panel: Recent Labs  Lab 12/09/20 2207  NA 143  K 4.2  CL 109  CO2 23  GLUCOSE 217*  BUN 31*  CREATININE 1.15  CALCIUM 8.7*   GFR: Estimated Creatinine Clearance: 41.5 mL/min (by C-G formula based on SCr of 1.15 mg/dL). Liver Function Tests: No results for input(s): AST, ALT, ALKPHOS, BILITOT, PROT, ALBUMIN in the last 168 hours. No results for input(s): LIPASE, AMYLASE in the last 168 hours. No results for input(s): AMMONIA in the last 168 hours. Coagulation Profile: Recent Labs  Lab 12/09/20 2207  INR 1.1   Cardiac Enzymes: No results for input(s): CKTOTAL, CKMB, CKMBINDEX, TROPONINI in the last 168 hours. BNP (last 3 results) No results for input(s): PROBNP in  the last 8760 hours. HbA1C: No results for input(s): HGBA1C in the last 72 hours. CBG: No results for input(s): GLUCAP in the last 168 hours. Lipid Profile: No results for input(s): CHOL, HDL, LDLCALC, TRIG, CHOLHDL, LDLDIRECT in the last 72 hours. Thyroid Function Tests: No results for input(s): TSH, T4TOTAL, FREET4, T3FREE, THYROIDAB in the last 72 hours. Anemia Panel: No results for input(s): VITAMINB12, FOLATE, FERRITIN, TIBC, IRON, RETICCTPCT in the last 72 hours. Urine analysis:    Component Value Date/Time   BILIRUBINUR Negative 11/30/2019 1345   PROTEINUR Negative 11/30/2019 1345   UROBILINOGEN 0.2 11/30/2019 1345   NITRITE Negative 11/30/2019 1345   LEUKOCYTESUR Negative 11/30/2019 1345    Radiological Exams on Admission: DG Chest Portable 1 View  Result Date: 12/09/2020 CLINICAL DATA:  Quit dyspnea, hypoxic, question fluid overload EXAM: PORTABLE CHEST 1 VIEW COMPARISON:  None. FINDINGS: Areas of mixed hazy interstitial and patchy opacity are present in both lungs with a perihilar and basilar predominance. Some additional areas of more coalescent opacity are seen in the medial lung bases with bandlike opacities in both  lower lobes, possibly scarring or subsegmental atelectatic change. The pulmonary vascularity is indistinct. Small bilateral effusions. No pneumothoraces. The cardiac silhouette is likely enlarged, possibly cardiomegaly or pericardial effusion. The aorta is calcified. The remaining cardiomediastinal contours are unremarkable. No acute osseous or soft tissue abnormality. Degenerative changes are present in the imaged spine and shoulders. Telemetry leads and external support devices overlie the chest. IMPRESSION: 1. Features suggestive of CHF/volume overload with pulmonary edema and bilateral effusions. 2. More patchy and coalescent opacities could reflect developing alveolar edema though underlying infection is difficult to exclude. 3. Enlarged cardiac silhouette, possibly cardiomegaly or pericardial effusion. Electronically Signed   By: Lovena Le M.D.   On: 12/09/2020 22:45    EKG: Independently reviewed. See above   Assessment/Plan  New onset CHF with associated acute hypoxic respiratory failure -concern for flash with acute onset /and uncontrolled bp on admission -currently on bipap in ed , will wean as able  -nitro paste -lasix 60 mg iv bid  -cycle ce, check tsh, echo in am  -ekg without hyperacute st -twave changes  -cardiology consult in am   Hypertensive Emergency  -uncontrolled BP on admission  -nitro paste x 1, prn medications  -resume home regimen -cycle ce, echo in am  CAP -noted hypoxemia -patient with increase wbc and  Opacities on cxr unable to r/o infection  -ctx/ azithromycin, de-escalate as able  -pulmonary toilet  -urine ag, sputum, f/u on culture data   Dementia Alzheimer's -late stage per last pcp note   Anxiety -not currently on medications  -increased with bipap, tx x 2 in ed -started on precedex, in order to tolerate bipap  Wean as able   hypothyroidism -continue synthroid  -check tsh   CKDIII  -at baseline   DVT prophylaxis:lovenox Code  Status:DNR Family Communication:  Discussed case with wife [place],[name] (Spouse)  [phone] (Mobile) Disposition Plan:patient  expected to be admitted greater than 2 midnights Consults called: cardiology Fath/ palliative care  Admission status:inpatient   Clance Boll MD [facility]  If 7PM-7AM, please contact night-coverage [email or web address] Password [facility]  12/09/2020, 11:33 PM

## 2020-12-09 NOTE — H&P (Incomplete)
History and Physical    Manuel Reynolds URK:[id number] DOB: 1930/08/26 DOA: 12/09/2020  PCP: [name], DO  Patient coming from: Home  I have personally briefly reviewed patient's old medical records in [place]  Chief Complaint: acute sob   HPI: Manuel Reynolds is a 87 y.o. male with medical history significant of  Dementia Alzheimer's, anxiety, hypothyroidism,Hypertension,b/l lower extremity edema on prn lasix., CKDIII who presents to ed BIB EMS on NRB 15L/min sat 95%.Of note patient is a poor historian and history is taken from chart.   Per chart patient has acute shortness of breath that started immediately prior to presentation. Per wife patient complained of increase sob while eating dinner. She also noted per ED MD that she heard crackling noises coming from his chest. Due to continued symptoms x 1 hour she called EMS.  In the field per noted patient sat on arrival of EMS was 60% on ra.  ED Course:  T:99.1,BP153/102, hr 92, sat 95% on NRB Labs: Wbc 14,  hgb 14.7,plt277, Na 143, k4.2, cl109, glu217, cr 1.15 at baseline, CE 46, BNP 2103 EKG: sinus tachycardia , pvc , LAFB, q in III,AVF Respiratory panel negative  Cxr: 1. Features suggestive of CHF/volume overload with pulmonary edema and bilateral effusions. 2. More patchy and coalescent opacities could reflect developing alveolar edema though underlying infection is difficult to exclude. 3. Enlarged cardiac silhouette, possibly cardiomegaly or pericardial Effusion.  Tx: lasix 60 mg x 1 iv , ceftriaxone/ azithromycin   Review of Systems: As per HPI otherwise 10 point review of systems negative.   Past Medical History:  Diagnosis Date  . Anxiety   . Hypertension   . Lymphoma malignant, nodular, lymphocytic ([place]) 05/25/2015  . Thyroid disease     Past Surgical History:  Procedure Laterality Date  . EYE SURGERY  2010   cataract     reports that he has never smoked. He has never used smokeless tobacco.  He reports that he does not drink alcohol and does not use drugs.  Allergies  Allergen Reactions  . Other Rash  . Sulfa Antibiotics Rash  . Sulfacetamide Sodium Rash    Family History  Problem Relation Age of Onset  . Hypertension Father   . Hypertension Mother   . Lung cancer Sister   . Diabetes Maternal Aunt   . Diabetes Maternal Uncle   . Diabetes Maternal Aunt   . Diabetes Maternal Uncle     Prior to Admission medications   Medication Sig Start Date End Date Taking? Authorizing Provider  acebutolol (SECTRAL) 200 MG capsule TAKE 1 CAPSULE(200 MG) BY MOUTH DAILY 08/27/20   Karamalegos, [name], DO  Cholecalciferol (VITAMIN D3) 10 MCG (400 UNIT) tablet     [provider]  dorzolamide-timolol (COSOPT) 22.3-6.8 MG/ML ophthalmic solution Place 1 drop into both eyes 2 (two) times daily.    [provider]  furosemide (LASIX) 20 MG tablet TAKE 1 TABLET(20 MG) BY MOUTH DAILY AS NEEDED FOR FLUID RETENTION OR SWELLING 08/28/20   Karamalegos, [name], DO  latanoprost (XALATAN) 0.005 % ophthalmic solution 1 drop at bedtime.    [provider]  levothyroxine (SYNTHROID) 50 MCG tablet TAKE 1 TABLET(50 MCG) BY MOUTH DAILY BEFORE BREAKFAST 08/08/20   Karamalegos, [name], DO  losartan (COZAAR) 50 MG tablet TAKE 1 TABLET(50 MG) BY MOUTH DAILY 08/03/20   Karamalegos, [name], DO  meclizine (ANTIVERT) 25 MG tablet Take 1 tablet (25 mg total) by mouth 3 (three) times daily as  needed for dizziness. 09/18/20   Karamalegos, [name], DO  Melatonin 5 MG TABS Take 2.5 mg by mouth at bedtime.    [provider]  polyethylene glycol powder (GLYCOLAX/MIRALAX) 17 GM/SCOOP powder Take 17 g by mouth daily as needed. Use half or whole daily, or can use every other day, goal for prevention    [provider]  Probiotic Product (PROBIOTIC-10 PO) Take by mouth.    [provider]  sildenafil (VIAGRA) 100 MG tablet Take 1 tablet (100 mg total) by  mouth as needed for erectile dysfunction. 10/25/20   Verl Bangs, FNP  simvastatin (ZOCOR) 40 MG tablet TAKE 1 TABLET(40 MG) BY MOUTH DAILY 08/03/20   Parks Ranger, [name], DO  VITAMIN D PO Take by mouth.    [provider]    Physical Exam: Vitals:   12/09/20 2205 12/09/20 2220 12/09/20 2230 12/09/20 2300  BP: (!) 153/102  (!) 165/113 (!) 152/120  Pulse: 92  86 86  Resp:   (!) 30 (!) 36  Temp: 99.1 F (37.3 C)     TempSrc: Oral     SpO2: 95% 100% 97% 99%  Weight:      Height:        Constitutional: NAD, calm, comfortable Vitals:   12/09/20 2205 12/09/20 2220 12/09/20 2230 12/09/20 2300  BP: (!) 153/102  (!) 165/113 (!) 152/120  Pulse: 92  86 86  Resp:   (!) 30 (!) 36  Temp: 99.1 F (37.3 C)     TempSrc: Oral     SpO2: 95% 100% 97% 99%  Weight:      Height:       Eyes: PERRL, lids and conjunctivae normal ENMT: Mucous membranes are moist. Posterior pharynx clear of any exudate or lesions.Normal dentition.  Neck: normal, supple, no masses, no thyromegaly Respiratory: clear to auscultation bilaterally, no wheezing, no crackles. Normal respiratory effort. No accessory muscle use.  Cardiovascular: Regular rate and rhythm, no murmurs / rubs / gallops. No extremity edema. 2+ pedal pulses. No carotid bruits.  Abdomen: no tenderness, no masses palpated. No hepatosplenomegaly. Bowel sounds positive.  Musculoskeletal: no clubbing / cyanosis. No joint deformity upper and lower extremities. Good ROM, no contractures. Normal muscle tone.  Skin: no rashes, lesions, ulcers. No induration Neurologic: CN 2-12 grossly intact. Sensation intact, DTR normal. Strength 5/5 in all 4.  Psychiatric: Normal judgment and insight. Alert and oriented x 3. Normal mood.    Labs on Admission: I have personally reviewed following labs and imaging studies  CBC: Recent Labs  Lab 12/09/20 2207  WBC 14.0*  NEUTROABS 7.2  HGB 14.7  HCT 45.8  MCV 88.9  PLT 425   Basic Metabolic  Panel: Recent Labs  Lab 12/09/20 2207  NA 143  K 4.2  CL 109  CO2 23  GLUCOSE 217*  BUN 31*  CREATININE 1.15  CALCIUM 8.7*   GFR: Estimated Creatinine Clearance: 41.5 mL/min (by C-G formula based on SCr of 1.15 mg/dL). Liver Function Tests: No results for input(s): AST, ALT, ALKPHOS, BILITOT, PROT, ALBUMIN in the last 168 hours. No results for input(s): LIPASE, AMYLASE in the last 168 hours. No results for input(s): AMMONIA in the last 168 hours. Coagulation Profile: Recent Labs  Lab 12/09/20 2207  INR 1.1   Cardiac Enzymes: No results for input(s): CKTOTAL, CKMB, CKMBINDEX, TROPONINI in the last 168 hours. BNP (last 3 results) No results for input(s): PROBNP in the last 8760 hours. HbA1C: No results for input(s): HGBA1C in  the last 72 hours. CBG: No results for input(s): GLUCAP in the last 168 hours. Lipid Profile: No results for input(s): CHOL, HDL, LDLCALC, TRIG, CHOLHDL, LDLDIRECT in the last 72 hours. Thyroid Function Tests: No results for input(s): TSH, T4TOTAL, FREET4, T3FREE, THYROIDAB in the last 72 hours. Anemia Panel: No results for input(s): VITAMINB12, FOLATE, FERRITIN, TIBC, IRON, RETICCTPCT in the last 72 hours. Urine analysis:    Component Value Date/Time   BILIRUBINUR Negative 11/30/2019 1345   PROTEINUR Negative 11/30/2019 1345   UROBILINOGEN 0.2 11/30/2019 1345   NITRITE Negative 11/30/2019 1345   LEUKOCYTESUR Negative 11/30/2019 1345    Radiological Exams on Admission: DG Chest Portable 1 View  Result Date: 12/09/2020 CLINICAL DATA:  Quit dyspnea, hypoxic, question fluid overload EXAM: PORTABLE CHEST 1 VIEW COMPARISON:  None. FINDINGS: Areas of mixed hazy interstitial and patchy opacity are present in both lungs with a perihilar and basilar predominance. Some additional areas of more coalescent opacity are seen in the medial lung bases with bandlike opacities in both lower lobes, possibly scarring or subsegmental atelectatic change. The  pulmonary vascularity is indistinct. Small bilateral effusions. No pneumothoraces. The cardiac silhouette is likely enlarged, possibly cardiomegaly or pericardial effusion. The aorta is calcified. The remaining cardiomediastinal contours are unremarkable. No acute osseous or soft tissue abnormality. Degenerative changes are present in the imaged spine and shoulders. Telemetry leads and external support devices overlie the chest. IMPRESSION: 1. Features suggestive of CHF/volume overload with pulmonary edema and bilateral effusions. 2. More patchy and coalescent opacities could reflect developing alveolar edema though underlying infection is difficult to exclude. 3. Enlarged cardiac silhouette, possibly cardiomegaly or pericardial effusion. Electronically Signed   By: Lovena Le M.D.   On: 12/09/2020 22:45    EKG: Independently reviewed. ***  Assessment/Plan  New onset CHF with associated acute hypoxic respiratory failure -currently on bipap in ed , will wean as able  -lasix 60 mg iv bid  -cycle ce, check tsh, echo in am  -ekg without hyperacute st -twave changes  -cardiology consult in am   CAP -patient with increase wbc and  Opacities on cxr unable to r/o infection  -ctx/ azith  DVT prophylaxis: *** (Lovenox/Heparin/SCD's/anticoagulated/None (if comfort care) Code Status: *** (Full/Partial (specify details) Family Communication: *** (Specify name, relationship. Do not write "discussed with patient". Specify tel # if discussed over the phone) Disposition Plan: *** (specify when and where you expect patient to be discharged) Consults called: *** (with names) Admission status: *** (inpatient / obs / tele / medical floor / SDU)   Clance Boll MD [facility] Pager [phone]  If 7PM-7AM, please contact night-coverage [email or web address] Password [email or web address]  12/09/2020, 11:33 PM

## 2020-12-09 NOTE — ED Provider Notes (Signed)
[facility] Emergency Department Provider Note ____________________________________________   Event Date/Time   First MD Initiated Contact with Patient 12/09/20 2202     (approximate)  I have reviewed the triage vital signs and the nursing notes.  HISTORY  Chief Complaint Respiratory Distress  HPI Manuel Reynolds is a 87 y.o. male history of lymphoma and peripheral edema as well as dementia  Patient's  wife reports he reported that dinner that he started feel short of breath then over the next hour or so became very short of breath to the point she could hear him crackling.  She called EMS.  EMS reports patient severely hypoxic with increased work of breathing but was not tolerant of CPAP.  Past Medical History:  Diagnosis Date  . Anxiety   . Hypertension   . 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 Thyroid disease     Patient Active Problem List   Diagnosis Date Noted  . History of deep venous thrombosis (DVT) of distal vein of left lower extremity 06/27/2020  . Late onset Alzheimer's disease with behavioral disturbance ([name]) 03/13/2020  . Anxiety, generalized 03/08/2020  . Loss of memory 03/08/2020  . Sleeping difficulty 03/08/2020  . Visual hallucination 03/08/2020  . Other insomnia 12/28/2019  . Pre-diabetes 12/17/2017  . Hypnopompic hallucination 06/19/2017  . CKD (chronic kidney disease), stage III ([place]) 03/12/2017  . Plantar fasciitis 03/11/2017  . Bilateral lower extremity edema 08/17/2015  . Lymphoma in remission ([place]) 05/25/2015  . Benign hypertension with CKD (chronic kidney disease) stage III ([place]) 05/11/2015  . Hyperlipidemia 05/11/2015  . Hypothyroidism 05/11/2015  . Anxiety 05/11/2015  . ED (erectile dysfunction) 05/11/2015    Past Surgical History:  Procedure Laterality Date  . EYE SURGERY  2010   cataract    Prior to Admission medications   Medication Sig Start Date End Date Taking? Authorizing Provider   acebutolol (SECTRAL) 200 MG capsule TAKE 1 CAPSULE(200 MG) BY MOUTH DAILY 08/27/20   Karamalegos, [name], DO  Cholecalciferol (VITAMIN D3) 10 MCG (400 UNIT) tablet     [provider]  dorzolamide-timolol (COSOPT) 22.3-6.8 MG/ML ophthalmic solution Place 1 drop into both eyes 2 (two) times daily.    [provider]  furosemide (LASIX) 20 MG tablet TAKE 1 TABLET(20 MG) BY MOUTH DAILY AS NEEDED FOR FLUID RETENTION OR SWELLING 08/28/20   Karamalegos, [name], DO  latanoprost (XALATAN) 0.005 % ophthalmic solution 1 drop at bedtime.    [provider]  levothyroxine (SYNTHROID) 50 MCG tablet TAKE 1 TABLET(50 MCG) BY MOUTH DAILY BEFORE BREAKFAST 08/08/20   Karamalegos, [name], DO  losartan (COZAAR) 50 MG tablet TAKE 1 TABLET(50 MG) BY MOUTH DAILY 08/03/20   Karamalegos, [name], DO  meclizine (ANTIVERT) 25 MG tablet Take 1 tablet (25 mg total) by mouth 3 (three) times daily as needed for dizziness. 09/18/20   Karamalegos, [name], DO  Melatonin 5 MG TABS Take 2.5 mg by mouth at bedtime.    [provider]  polyethylene glycol powder (GLYCOLAX/MIRALAX) 17 GM/SCOOP powder Take 17 g by mouth daily as needed. Use half or whole daily, or can use every other day, goal for prevention    [provider]  Probiotic Product (PROBIOTIC-10 PO) Take by mouth.    [provider]  sildenafil (VIAGRA) 100 MG tablet Take 1 tablet (100 mg total) by mouth as needed for erectile dysfunction. 10/25/20   Malfi, Lupita Raider, FNP  simvastatin (ZOCOR) 40 MG tablet TAKE 1  TABLET(40 MG) BY MOUTH DAILY 08/03/20   [name], DO  VITAMIN D PO Take by mouth.    [provider]    Allergies Other, Sulfa antibiotics, and Sulfacetamide sodium  Family History  Problem Relation Age of Onset  . Hypertension Father   . Hypertension Mother   . Lung cancer Sister   . Diabetes Maternal Aunt   . Diabetes Maternal Uncle   . Diabetes Maternal Aunt    . Diabetes Maternal Uncle     Social History Social History   Tobacco Use  . Smoking status: Never Smoker  . Smokeless tobacco: Never Used  Vaping Use  . Vaping Use: Never used  Substance Use Topics  . Alcohol use: No  . Drug use: No    Review of Systems  EM caveat Patient denies pain.  Reports shortness of breath is getting better.    ____________________________________________   PHYSICAL EXAM:  VITAL SIGNS: ED Triage Vitals  Enc Vitals Group     BP 12/09/20 2205 (!) 153/102     Pulse Rate 12/09/20 2205 92     Resp --      Temp 12/09/20 2205 99.1 F (37.3 C)     Temp Source 12/09/20 2205 Oral     SpO2 12/09/20 2205 95 %     Weight 12/09/20 2159 165 lb (74.8 kg)     Height 12/09/20 2159 5\' 7"  (1.702 m)     Head Circumference --      Peak Flow --      Pain Score 12/09/20 2159 0     Pain Loc --      Pain Edu? --      Excl. in [place]? --     Constitutional: Alert and oriented to self.  Appears to have moderate to severe increased work of breathing Eyes: Conjunctivae are normal. Head: Atraumatic. Nose: No congestion/rhinnorhea. Mouth/Throat: Mucous membranes are moist. Neck: No stridor.  Cardiovascular: Normal rate, regular rhythm. Grossly normal heart sounds.  Good peripheral circulation. Respiratory: Tachypnea, hypoxia with oxygen saturation of about 60% on room air, quickly corrects with nonrebreather and then placed on BiPAP.  Patient was noted Rales in lung fields diffusely.  No wheezing. Gastrointestinal: Soft and nontender. No distention. Musculoskeletal: No lower extremity tenderness but has 3+ bilateral pitting edema Neurologic:  Normal speech and language. No gross focal neurologic deficits are appreciated.  Skin:  Skin is warm, dry and intact. No rash noted. Psychiatric: Mood and affect are anxious  ____________________________________________   LABS (all labs ordered are listed, but only abnormal results are displayed)  Labs Reviewed  CBC  WITH DIFFERENTIAL/PLATELET - Abnormal; Notable for the following components:      Result Value   WBC 14.0 (*)    RDW 15.6 (*)    Lymphs Abs 4.8 (*)    Monocytes Absolute 1.7 (*)    All other components within normal limits  BASIC METABOLIC PANEL - Abnormal; Notable for the following components:   Glucose, Bld 217 (*)    BUN 31 (*)    Calcium 8.7 (*)    All other components within normal limits  BRAIN NATRIURETIC PEPTIDE - Abnormal; Notable for the following components:   B Natriuretic Peptide 2,103.1 (*)    All other components within normal limits  TROPONIN I (HIGH SENSITIVITY) - Abnormal; Notable for the following components:   Troponin I (High Sensitivity) 46 (*)    All other components within normal limits  POC SARS CORONAVIRUS 2 AG -  ED - Normal  RESP PANEL BY RT-PCR (FLU A&B, COVID) ARPGX2  CULTURE, BLOOD (ROUTINE X 2)  CULTURE, BLOOD (ROUTINE X 2)  PROTIME-INR  LACTIC ACID, PLASMA   ____________________________________________  EKG  Reviewed inter by me at 2210 Heart rate 90 QRS 89 QTc 480 Normal sinus rhythm, mild nonspecific T wave abnormality noted most in the left precordial leads.  No STEMI ____________________________________________  RADIOLOGY  DG Chest Portable 1 View  Result Date: 12/09/2020 CLINICAL DATA:  Quit dyspnea, hypoxic, question fluid overload EXAM: PORTABLE CHEST 1 VIEW COMPARISON:  None. FINDINGS: Areas of mixed hazy interstitial and patchy opacity are present in both lungs with a perihilar and basilar predominance. Some additional areas of more coalescent opacity are seen in the medial lung bases with bandlike opacities in both lower lobes, possibly scarring or subsegmental atelectatic change. The pulmonary vascularity is indistinct. Small bilateral effusions. No pneumothoraces. The cardiac silhouette is likely enlarged, possibly cardiomegaly or pericardial effusion. The aorta is calcified. The remaining cardiomediastinal contours are unremarkable.  No acute osseous or soft tissue abnormality. Degenerative changes are present in the imaged spine and shoulders. Telemetry leads and external support devices overlie the chest. IMPRESSION: 1. Features suggestive of CHF/volume overload with pulmonary edema and bilateral effusions. 2. More patchy and coalescent opacities could reflect developing alveolar edema though underlying infection is difficult to exclude. 3. Enlarged cardiac silhouette, possibly cardiomegaly or pericardial effusion. Electronically Signed   By: Lovena Le M.D.   On: 12/09/2020 22:45   Personally viewed by me Imaging reviewed, concerning for possible volume overload and potential opacifications.  Cardiomegaly also suspected ____________________________________________   PROCEDURES  Procedure(s) performed: None  Procedures  Critical Care performed: Yes, see critical care note(s)  CRITICAL CARE Performed by: Delman Kitten   Total critical care time: 35 minutes  Critical care time was exclusive of separately billable procedures and treating other patients.  Critical care was necessary to treat or prevent imminent or life-threatening deterioration.  Critical care was time spent personally by me on the following activities: development of treatment plan with patient and/or surrogate as well as nursing, discussions with consultants, evaluation of patient's response to treatment, examination of patient, obtaining history from patient or surrogate, ordering and performing treatments and interventions, ordering and review of laboratory studies, ordering and review of radiographic studies, pulse oximetry and re-evaluation of patient's condition.  ____________________________________________   INITIAL IMPRESSION / ASSESSMENT AND PLAN / ED COURSE  Pertinent labs & imaging results that were available during my care of the patient were reviewed by me and considered in my medical decision making (see chart for details).    Patient presents for severe hypoxia with what appears to be sudden onset of dyspnea.  Imaging studies concerning for volume overload, but also some concern. I suspect flash pulmonary edema based on patient's physical assessment significant peripheral edema and Rales. But also cannot exclude underlying pneumonia.  Clinical Course as of 12/09/20 2353  Nancy Fetter Dec 09, 2020  2240 Patient tolerating BiPAP well, he is alert, oxygen saturations in the high 90s and he is resting quite comfortably with it.  Distress has improved, only mild accessory muscle use at this time.  Fully alert. [MQ]    Clinical Course User Index [MQ] Delman Kitten, MD   Per wife, patient has a DO NOT Resuscitate. He is, however, ok to be intubated.   Patient denies chest pain.  ----------------------------------------- 11:52 PM on 12/09/2020 -----------------------------------------  Patient improving, code sepsis initiated. Patient be admitted to hospitalist  Dr. Marcello Moores  ____________________________________________   FINAL CLINICAL IMPRESSION(S) / ED DIAGNOSES  Final diagnoses:  Acute congestive heart failure, unspecified heart failure type [facility])  Community acquired pneumonia, unspecified laterality  Acute hypoxemic respiratory failure ([place])        Note:  This document was prepared using Dragon voice recognition software and may include unintentional dictation errors       Delman Kitten, MD 12/09/20 2353

## 2020-12-09 NOTE — ED Triage Notes (Signed)
Pt BIBA for respiratory distress x 1 hour. EMS reports pt had an O2 saturation of 60s  on RA. Pt brought in on 10L via [place] and EMS reports O2 saturation in the 90s. Pt given 125 solumedrol and 1 SL nitro pta.

## 2020-12-09 NOTE — Progress Notes (Signed)
CODE SEPSIS - PHARMACY COMMUNICATION  **Broad Spectrum Antibiotics should be administered within 1 hour of Sepsis diagnosis**  Time Code Sepsis Called/Page Received: 2246  Antibiotics Ordered: Rocephin and Zithromax  Time of 1st antibiotic administration: 2306  Additional action taken by pharmacy: n/a  If necessary, Name of Provider/Nurse Contacted: n/a   Ena Dawley ,PharmD Clinical Pharmacist  12/09/2020  10:49 PM

## 2020-12-10 ENCOUNTER — Encounter: Payer: Self-pay | Admitting: Internal Medicine

## 2020-12-10 ENCOUNTER — Inpatient Hospital Stay (HOSPITAL_COMMUNITY)
Admit: 2020-12-10 | Discharge: 2020-12-10 | Disposition: A | Discharge status: Home / Self Care | Payer: Medicare Other | Attending: Internal Medicine | Admitting: Internal Medicine

## 2020-12-10 DIAGNOSIS — E870 Hyperosmolality and hypernatremia: Secondary | ICD-10-CM | POA: Diagnosis present

## 2020-12-10 DIAGNOSIS — I5033 Acute on chronic diastolic (congestive) heart failure: Secondary | ICD-10-CM | POA: Diagnosis not present

## 2020-12-10 DIAGNOSIS — I130 Hypertensive heart and chronic kidney disease with heart failure and stage 1 through stage 4 chronic kidney disease, or unspecified chronic kidney disease: Secondary | ICD-10-CM | POA: Diagnosis present

## 2020-12-10 DIAGNOSIS — I161 Hypertensive emergency: Secondary | ICD-10-CM | POA: Diagnosis present

## 2020-12-10 DIAGNOSIS — R1310 Dysphagia, unspecified: Secondary | ICD-10-CM | POA: Diagnosis present

## 2020-12-10 DIAGNOSIS — M6281 Muscle weakness (generalized): Secondary | ICD-10-CM | POA: Diagnosis not present

## 2020-12-10 DIAGNOSIS — G309 Alzheimer's disease, unspecified: Secondary | ICD-10-CM | POA: Diagnosis present

## 2020-12-10 DIAGNOSIS — R29818 Other symptoms and signs involving the nervous system: Secondary | ICD-10-CM | POA: Diagnosis not present

## 2020-12-10 DIAGNOSIS — I129 Hypertensive chronic kidney disease with stage 1 through stage 4 chronic kidney disease, or unspecified chronic kidney disease: Secondary | ICD-10-CM | POA: Diagnosis not present

## 2020-12-10 DIAGNOSIS — Z66 Do not resuscitate: Secondary | ICD-10-CM | POA: Diagnosis present

## 2020-12-10 DIAGNOSIS — Z79899 Other long term (current) drug therapy: Secondary | ICD-10-CM | POA: Diagnosis not present

## 2020-12-10 DIAGNOSIS — F0280 Dementia in other diseases classified elsewhere without behavioral disturbance: Secondary | ICD-10-CM | POA: Diagnosis present

## 2020-12-10 DIAGNOSIS — M2550 Pain in unspecified joint: Secondary | ICD-10-CM | POA: Diagnosis not present

## 2020-12-10 DIAGNOSIS — Z79890 Hormone replacement therapy (postmenopausal): Secondary | ICD-10-CM | POA: Diagnosis not present

## 2020-12-10 DIAGNOSIS — J189 Pneumonia, unspecified organism: Secondary | ICD-10-CM | POA: Diagnosis not present

## 2020-12-10 DIAGNOSIS — J9601 Acute respiratory failure with hypoxia: Secondary | ICD-10-CM | POA: Diagnosis present

## 2020-12-10 DIAGNOSIS — R627 Adult failure to thrive: Secondary | ICD-10-CM | POA: Diagnosis not present

## 2020-12-10 DIAGNOSIS — Z7401 Bed confinement status: Secondary | ICD-10-CM | POA: Diagnosis not present

## 2020-12-10 DIAGNOSIS — I5031 Acute diastolic (congestive) heart failure: Secondary | ICD-10-CM | POA: Diagnosis present

## 2020-12-10 DIAGNOSIS — I6782 Cerebral ischemia: Secondary | ICD-10-CM | POA: Diagnosis not present

## 2020-12-10 DIAGNOSIS — G9341 Metabolic encephalopathy: Secondary | ICD-10-CM | POA: Diagnosis not present

## 2020-12-10 DIAGNOSIS — W19XXXA Unspecified fall, initial encounter: Secondary | ICD-10-CM | POA: Diagnosis not present

## 2020-12-10 DIAGNOSIS — C8590 Non-Hodgkin lymphoma, unspecified, unspecified site: Secondary | ICD-10-CM | POA: Diagnosis present

## 2020-12-10 DIAGNOSIS — F419 Anxiety disorder, unspecified: Secondary | ICD-10-CM | POA: Diagnosis present

## 2020-12-10 DIAGNOSIS — R531 Weakness: Secondary | ICD-10-CM | POA: Diagnosis not present

## 2020-12-10 DIAGNOSIS — J90 Pleural effusion, not elsewhere classified: Secondary | ICD-10-CM | POA: Diagnosis not present

## 2020-12-10 DIAGNOSIS — R471 Dysarthria and anarthria: Secondary | ICD-10-CM | POA: Diagnosis present

## 2020-12-10 DIAGNOSIS — N1831 Chronic kidney disease, stage 3a: Secondary | ICD-10-CM | POA: Diagnosis present

## 2020-12-10 DIAGNOSIS — I110 Hypertensive heart disease with heart failure: Secondary | ICD-10-CM | POA: Diagnosis not present

## 2020-12-10 DIAGNOSIS — R404 Transient alteration of awareness: Secondary | ICD-10-CM | POA: Diagnosis not present

## 2020-12-10 DIAGNOSIS — N179 Acute kidney failure, unspecified: Secondary | ICD-10-CM | POA: Diagnosis not present

## 2020-12-10 DIAGNOSIS — I509 Heart failure, unspecified: Secondary | ICD-10-CM | POA: Diagnosis not present

## 2020-12-10 DIAGNOSIS — Z7189 Other specified counseling: Secondary | ICD-10-CM | POA: Diagnosis not present

## 2020-12-10 DIAGNOSIS — Z20822 Contact with and (suspected) exposure to covid-19: Secondary | ICD-10-CM | POA: Diagnosis present

## 2020-12-10 DIAGNOSIS — F05 Delirium due to known physiological condition: Secondary | ICD-10-CM | POA: Diagnosis not present

## 2020-12-10 DIAGNOSIS — E876 Hypokalemia: Secondary | ICD-10-CM | POA: Diagnosis not present

## 2020-12-10 DIAGNOSIS — Z882 Allergy status to sulfonamides status: Secondary | ICD-10-CM | POA: Diagnosis not present

## 2020-12-10 DIAGNOSIS — E039 Hypothyroidism, unspecified: Secondary | ICD-10-CM | POA: Diagnosis present

## 2020-12-10 DIAGNOSIS — G319 Degenerative disease of nervous system, unspecified: Secondary | ICD-10-CM | POA: Diagnosis not present

## 2020-12-10 DIAGNOSIS — Z515 Encounter for palliative care: Secondary | ICD-10-CM | POA: Diagnosis not present

## 2020-12-10 DIAGNOSIS — I674 Hypertensive encephalopathy: Secondary | ICD-10-CM | POA: Diagnosis present

## 2020-12-10 LAB — CREATININE, SERUM
Creatinine, Ser: 0.99 mg/dL (ref 0.61–1.24)
GFR, Estimated: >60 mL/min (ref >60)

## 2020-12-10 LAB — PROCALCITONIN: Procalcitonin: <0.1 ng/mL

## 2020-12-10 LAB — CBC
HCT: 43 % (ref 39.0–52.0)
Hemoglobin: 14.5 g/dL (ref 13.0–17.0)
MCH: 29.3 pg (ref 26.0–34.0)
MCHC: 33.7 g/dL (ref 30.0–36.0)
MCV: 86.9 fL (ref 80.0–100.0)
Platelets: 245 10*3/uL (ref 150–400)
RBC: 4.95 MIL/uL (ref 4.22–5.81)
RDW: 15.2 % (ref 11.5–15.5)
WBC: 6 10*3/uL (ref 4.0–10.5)
nRBC: 0 % (ref 0.0–0.2)

## 2020-12-10 LAB — ECHOCARDIOGRAM COMPLETE
Height: 67 in
S' Lateral: 2.8 cm
Weight: 2640 oz

## 2020-12-10 LAB — STREP PNEUMONIAE URINARY ANTIGEN: Strep Pneumo Urinary Antigen: NEGATIVE

## 2020-12-10 LAB — TSH: TSH: 3.72 u[IU]/mL (ref 0.350–4.500)

## 2020-12-10 LAB — HIV ANTIBODY (ROUTINE TESTING W REFLEX): HIV Screen 4th Generation wRfx: NONREACTIVE

## 2020-12-10 LAB — TROPONIN I (HIGH SENSITIVITY): Troponin I (High Sensitivity): 78 ng/L — ABNORMAL HIGH (ref <18)

## 2020-12-10 LAB — LACTIC ACID, PLASMA: Lactic Acid, Venous: 1.5 mmol/L (ref 0.5–1.9)

## 2020-12-10 MED ORDER — SODIUM CHLORIDE 0.9 % IV SOLN: 2.0000 g | INTRAVENOUS | Status: DC

## 2020-12-10 MED ORDER — MIDAZOLAM HCL 2 MG/2ML IJ SOLN
INTRAMUSCULAR | Status: AC
  Administered 2020-12-10: 1 mg via INTRAVENOUS
  Filled 2020-12-10: qty 2

## 2020-12-10 MED ORDER — DROPERIDOL 2.5 MG/ML IJ SOLN
2.5000 mg | Freq: Once | INTRAMUSCULAR | Status: AC
  Administered 2020-12-10: 2.5 mg via INTRAVENOUS

## 2020-12-10 MED ORDER — LOSARTAN POTASSIUM 50 MG PO TABS
50.0000 mg | ORAL_TABLET | Freq: Every day | ORAL | Status: DC
  Administered 2020-12-11: 50 mg via ORAL
  Filled 2020-12-10: qty 1

## 2020-12-10 MED ORDER — MIDAZOLAM HCL 2 MG/2ML IJ SOLN: 1.0000 mg | Freq: Once | INTRAMUSCULAR | Status: AC

## 2020-12-10 MED ORDER — LORAZEPAM 2 MG/ML IJ SOLN
1.0000 mg | INTRAMUSCULAR | Status: DC | PRN
  Administered 2020-12-10: 1 mg via INTRAVENOUS
  Filled 2020-12-10: qty 1

## 2020-12-10 MED ORDER — ACETAMINOPHEN 325 MG PO TABS
650.0000 mg | ORAL_TABLET | ORAL | Status: DC | PRN
  Administered 2020-12-12: 650 mg via ORAL
  Filled 2020-12-10: qty 2

## 2020-12-10 MED ORDER — HALOPERIDOL LACTATE 5 MG/ML IJ SOLN
5.0000 mg | Freq: Four times a day (QID) | INTRAMUSCULAR | Status: DC | PRN
  Administered 2020-12-10: 5 mg via INTRAVENOUS

## 2020-12-10 MED ORDER — SODIUM CHLORIDE 0.9% FLUSH: 3.0000 mL | INTRAVENOUS | Status: DC | PRN

## 2020-12-10 MED ORDER — SIMVASTATIN 20 MG PO TABS
40.0000 mg | ORAL_TABLET | Freq: Every day | ORAL | Status: DC
  Administered 2020-12-11 – 2020-12-12 (×2): 40 mg via ORAL
  Filled 2020-12-10 (×2): qty 4
  Filled 2020-12-10 (×2): qty 2

## 2020-12-10 MED ORDER — NITROGLYCERIN 2 % TD OINT
0.5000 [in_us] | TOPICAL_OINTMENT | Freq: Once | TRANSDERMAL | Status: AC
  Administered 2020-12-10: 0.5 [in_us] via TOPICAL
  Filled 2020-12-10: qty 1

## 2020-12-10 MED ORDER — ENOXAPARIN SODIUM 40 MG/0.4ML ~~LOC~~ SOLN
40.0000 mg | SUBCUTANEOUS | Status: DC
  Administered 2020-12-11 – 2020-12-18 (×8): 40 mg via SUBCUTANEOUS
  Filled 2020-12-10 (×8): qty 0.4

## 2020-12-10 MED ORDER — AZITHROMYCIN 500 MG IV SOLR: 500.0000 mg | INTRAVENOUS | Status: DC

## 2020-12-10 MED ORDER — HALOPERIDOL LACTATE 5 MG/ML IJ SOLN
INTRAMUSCULAR | Status: AC
  Filled 2020-12-10: qty 1

## 2020-12-10 MED ORDER — ASPIRIN EC 81 MG PO TBEC
81.0000 mg | DELAYED_RELEASE_TABLET | Freq: Every day | ORAL | Status: DC
  Administered 2020-12-11 – 2020-12-18 (×4): 81 mg via ORAL
  Filled 2020-12-10 (×5): qty 1

## 2020-12-10 MED ORDER — SODIUM CHLORIDE 0.9% FLUSH
3.0000 mL | Freq: Two times a day (BID) | INTRAVENOUS | Status: DC
  Administered 2020-12-10 – 2020-12-18 (×13): 3 mL via INTRAVENOUS

## 2020-12-10 MED ORDER — HYDRALAZINE HCL 20 MG/ML IJ SOLN
10.0000 mg | Freq: Four times a day (QID) | INTRAMUSCULAR | Status: DC | PRN
  Administered 2020-12-12: 10 mg via INTRAVENOUS
  Filled 2020-12-10: qty 1

## 2020-12-10 MED ORDER — LEVOTHYROXINE SODIUM 50 MCG PO TABS
50.0000 ug | ORAL_TABLET | Freq: Every day | ORAL | Status: DC
  Administered 2020-12-11 – 2020-12-18 (×4): 50 ug via ORAL
  Filled 2020-12-10 (×6): qty 1

## 2020-12-10 MED ORDER — DEXMEDETOMIDINE HCL IN NACL 400 MCG/100ML IV SOLN
0.4000 ug/kg/h | INTRAVENOUS | Status: DC
  Administered 2020-12-10: 0.4 ug/kg/h via INTRAVENOUS
  Filled 2020-12-10 (×2): qty 100

## 2020-12-10 MED ORDER — SODIUM CHLORIDE 0.9 % IV SOLN: 250.0000 mL | INTRAVENOUS | Status: DC | PRN

## 2020-12-10 MED ORDER — ONDANSETRON HCL 4 MG/2ML IJ SOLN: 4.0000 mg | Freq: Four times a day (QID) | INTRAMUSCULAR | Status: DC | PRN

## 2020-12-10 MED ORDER — FUROSEMIDE 10 MG/ML IJ SOLN
60.0000 mg | Freq: Two times a day (BID) | INTRAMUSCULAR | Status: DC
  Administered 2020-12-10 – 2020-12-12 (×5): 60 mg via INTRAVENOUS
  Filled 2020-12-10 (×5): qty 8

## 2020-12-10 NOTE — ED Notes (Signed)
Patient self-removed external catheter. Changed patients bedding and placed new external catheter, and provided warm blanket. Patient resting calmly in bed.

## 2020-12-10 NOTE — ED Notes (Signed)
Per MD, Titrate patient from Precedex. New rate 0.3 mcg/kg/hr. Will continue to monitor. PRN ativan ordered for agitation once weaned from precedex. Also contacted respiratory to wean patient off of Bipap. Bambi to come assess and place patient on alternative oxygen delivery. Patient resting with no acute distress.

## 2020-12-10 NOTE — ED Notes (Addendum)
This RN and Cabin crew at bedside. Pt taking of BiPAP mask and trying to pull mitts off. RNs trying to redirect pt. Pt kicking and swinging arms at RNs. Precedex remains at 64mcg due to pt's HR. Pt RA sats 80s. MD messaged and haldol administered.

## 2020-12-10 NOTE — ED Notes (Signed)
precedex discontinued due to necessity. Patient was titrated off and given PRN ativan.

## 2020-12-10 NOTE — ED Notes (Addendum)
This RN and Denton Ar, RN received report on this pt. Pt precedex running at 0.93mcg/ml. HR maintaining low 50s and intermittently decreasing to 40s. Precedex decreased to 0.61mcg/ml. Pt remains on BiPAP. New IV started and morning labs drawn. Male purewick and hand mitts remain on pt at this time.

## 2020-12-10 NOTE — Progress Notes (Signed)
OT Cancellation Note  Patient Details Name: Manuel Reynolds MRN: [id number] DOB: [birth year]   Cancelled Treatment:    Reason Eval/Treat Not Completed: Medical issues which prohibited therapy. OT order received and chart reviewed. Per chart, pt with increased agitation and remains on Bipap. OT will hold until pt is able to actively participate in OT intervention.   Darleen Crocker, [place], OTR/L , CBIS ascom [phone]  12/10/20, 11:29 AM   12/10/2020, 11:28 AM

## 2020-12-10 NOTE — Progress Notes (Signed)
Elink following pt for sepsis protocol 

## 2020-12-10 NOTE — Patient Care Conference (Signed)
Called and updated patient's wife over phone for update. All questions answered. Code status addressed with pt's wife who states wishes are known to be DNR and DNI. Orders are placed

## 2020-12-10 NOTE — ED Notes (Signed)
Mittens applied and patient repositioned for comfort. Male catheter remains in place. Incontinence pad changed.

## 2020-12-10 NOTE — ED Notes (Signed)
Airborne precautions discontinued due to negative covid test.

## 2020-12-10 NOTE — ED Notes (Signed)
Pt easily arousable to touch. Resting more comfortably at this time.

## 2020-12-10 NOTE — ED Notes (Signed)
Patient presenting with increased agitation. Ativan prepared for administration.

## 2020-12-10 NOTE — TOC Progression Note (Addendum)
Transition of Care [facility]) - Progression Note    Patient Details  Name: Manuel Reynolds MRN: [id number] Date of Birth: 16-Dec-1929  Transition of Care [facility]) CM/SW [place], RN Phone Number: 12/10/2020, 1:55 PM  Clinical Narrative:    Attempted to see patient at beside. Patient currently on bipap and unable to communicate.   Call placed to spouse, [name], confirmed patient and spouse live in [facility] facility. Reports patient is ambulates with walker or cane and is able to feed himself and take his medications. Spouse assists with bathing and dressing and keeps medications organized. Reports patient has days that he is oriented and other days he seems totally disoriented. Spouse is planning to hire some PCS services to assist with ADl's after discharge if patient is able to return home. Spouse states she is worried about patient and her not being there to explain to him. Requests that staff remind him that she is not able to come due to bad weather. No other needs or concerns at this time.         Expected Discharge Plan and Services                                                 Social Determinants of Health (SDOH) Interventions    Readmission Risk Interventions No flowsheet data found.

## 2020-12-10 NOTE — Consult Note (Signed)
.    Cardiology Consultation Note    Patient ID: Manuel Reynolds, MRN: [id number], DOB/AGE: [id number] 85 y.o. Admit date: 12/09/2020   Date of Consult: 12/10/2020 Primary Physician: [name], DO Primary Cardiologist: none  Chief Complaint: sob Reason for Consultation: Duanne Limerick Requesting MD: Dr. Wyline Copas  HPI: Manuel Reynolds is a 87 y.o. male with history of dementia, anxiety, lymphoma in remission, hypertension and history of lower extremity edema with as needed Lasix.  Presents emergency room on a nonrebreather.  Patiently was increased shortness of breath.  Was 60% oxygenation on room air per EMS.  Chest x-ray in the emergency room revealed mild pulmonary edema.  BNP was 2103.  Baseline not available.  High sensitive troponins minimally elevated consistent with demand.  EKG revealed sinus rhythm with nonspecific ST-T wave changes.  No ischemic abnormalities.  Been placed on IV Lasix.  Echo pending.  Renal insufficiency although creatinine is at its baseline.  CBC and hemoglobin normal. Past Medical History:  Diagnosis Date  . Anxiety   . Hypertension   . 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 Thyroid disease       Surgical History:  Past Surgical History:  Procedure Laterality Date  . EYE SURGERY  2010   cataract     Home Meds: Prior to Admission medications   Medication Sig Start Date End Date Taking? Authorizing Provider  acebutolol (SECTRAL) 200 MG capsule TAKE 1 CAPSULE(200 MG) BY MOUTH DAILY 08/27/20   Karamalegos, [name], DO  Cholecalciferol (VITAMIN D3) 10 MCG (400 UNIT) tablet     [provider]  dorzolamide-timolol (COSOPT) 22.3-6.8 MG/ML ophthalmic solution Place 1 drop into both eyes 2 (two) times daily.    [provider]  furosemide (LASIX) 20 MG tablet TAKE 1 TABLET(20 MG) BY MOUTH DAILY AS NEEDED FOR FLUID RETENTION OR SWELLING 08/28/20   Karamalegos, [name], DO  latanoprost (XALATAN) 0.005 % ophthalmic solution 1  drop at bedtime.    [provider]  levothyroxine (SYNTHROID) 50 MCG tablet TAKE 1 TABLET(50 MCG) BY MOUTH DAILY BEFORE BREAKFAST 08/08/20   Karamalegos, [name], DO  losartan (COZAAR) 50 MG tablet TAKE 1 TABLET(50 MG) BY MOUTH DAILY 08/03/20   Karamalegos, [name], DO  meclizine (ANTIVERT) 25 MG tablet Take 1 tablet (25 mg total) by mouth 3 (three) times daily as needed for dizziness. 09/18/20   Karamalegos, [name], DO  Melatonin 5 MG TABS Take 2.5 mg by mouth at bedtime.    [provider]  polyethylene glycol powder (GLYCOLAX/MIRALAX) 17 GM/SCOOP powder Take 17 g by mouth daily as needed. Use half or whole daily, or can use every other day, goal for prevention    [provider]  Probiotic Product (PROBIOTIC-10 PO) Take by mouth.    [provider]  sildenafil (VIAGRA) 100 MG tablet Take 1 tablet (100 mg total) by mouth as needed for erectile dysfunction. 10/25/20   Verl Bangs, FNP  simvastatin (ZOCOR) 40 MG tablet TAKE 1 TABLET(40 MG) BY MOUTH DAILY 08/03/20   Parks Ranger, [name], DO  VITAMIN D PO Take by mouth.    [provider]    Inpatient Medications:  . aspirin EC  81 mg Oral Daily  . enoxaparin (LOVENOX) injection  40 mg Subcutaneous Q24H  . furosemide  60 mg Intravenous Q12H  . levothyroxine  50 mcg Oral Q0600  . losartan  50 mg Oral Daily  . simvastatin  40 mg Oral q1800  . sodium chloride  flush  3 mL Intravenous Q12H   . sodium chloride    . azithromycin Stopped (12/10/20 0055)  . cefTRIAXone (ROCEPHIN)  IV Stopped (12/09/20 2336)  . dexmedetomidine (PRECEDEX) IV infusion 0.4 mcg/kg/hr (12/10/20 0740)    Allergies:  Allergies  Allergen Reactions  . Other Rash  . Sulfa Antibiotics Rash  . Sulfacetamide Sodium Rash    Social History   Socioeconomic History  . Marital status: Married    Spouse name: Not on file  . Number of children: Not on file  . Years of education: Not on file  . Highest education  level: Master's degree (e.g., MA, MS, MEng, MEd, MSW, MBA)  Occupational History  . Occupation: retired  Tobacco Use  . Smoking status: Never Smoker  . Smokeless tobacco: Never Used  Vaping Use  . Vaping Use: Never used  Substance and Sexual Activity  . Alcohol use: No  . Drug use: No  . Sexual activity: Not on file  Other Topics Concern  . Not on file  Social History Narrative  . Not on file   Social Determinants of Health   Financial Resource Strain: Low Risk   . Difficulty of Paying Living Expenses: Not hard at all  Food Insecurity: No Food Insecurity  . Worried About Charity fundraiser in the Last Year: Never true  . Ran Out of Food in the Last Year: Never true  Transportation Needs: No Transportation Needs  . Lack of Transportation (Medical): No  . Lack of Transportation (Non-Medical): No  Physical Activity: Insufficiently Active  . Days of Exercise per Week: 7 days  . Minutes of Exercise per Session: 10 min  Stress: No Stress Concern Present  . Feeling of Stress : Not at all  Social Connections: Not on file  Intimate Partner Violence: Not on file     Family History  Problem Relation Age of Onset  . Hypertension Father   . Hypertension Mother   . Lung cancer Sister   . Diabetes Maternal Aunt   . Diabetes Maternal Uncle   . Diabetes Maternal Aunt   . Diabetes Maternal Uncle      Review of Systems: A 12-system review of systems was performed and is negative except as noted in the HPI.  Labs: No results for input(s): CKTOTAL, CKMB, TROPONINI in the last 72 hours. Lab Results  Component Value Date   WBC 6.0 12/10/2020   HGB 14.5 12/10/2020   HCT 43.0 12/10/2020   MCV 86.9 12/10/2020   PLT 245 12/10/2020    Recent Labs  Lab 12/09/20 2207 12/10/20 0739  NA 143  --   K 4.2  --   CL 109  --   CO2 23  --   BUN 31*  --   CREATININE 1.15 0.99  CALCIUM 8.7*  --   GLUCOSE 217*  --    Lab Results  Component Value Date   CHOL 158 03/06/2020   HDL 36  (L) 03/06/2020   LDLCALC 102 (H) 03/06/2020   TRIG 104 03/06/2020   No results found for: DDIMER  Radiology/Studies:  DG Chest Portable 1 View  Result Date: 12/09/2020 CLINICAL DATA:  Quit dyspnea, hypoxic, question fluid overload EXAM: PORTABLE CHEST 1 VIEW COMPARISON:  None. FINDINGS: Areas of mixed hazy interstitial and patchy opacity are present in both lungs with a perihilar and basilar predominance. Some additional areas of more coalescent opacity are seen in the medial lung bases with bandlike opacities in both lower lobes, possibly scarring  or subsegmental atelectatic change. The pulmonary vascularity is indistinct. Small bilateral effusions. No pneumothoraces. The cardiac silhouette is likely enlarged, possibly cardiomegaly or pericardial effusion. The aorta is calcified. The remaining cardiomediastinal contours are unremarkable. No acute osseous or soft tissue abnormality. Degenerative changes are present in the imaged spine and shoulders. Telemetry leads and external support devices overlie the chest. IMPRESSION: 1. Features suggestive of CHF/volume overload with pulmonary edema and bilateral effusions. 2. More patchy and coalescent opacities could reflect developing alveolar edema though underlying infection is difficult to exclude. 3. Enlarged cardiac silhouette, possibly cardiomegaly or pericardial effusion. Electronically Signed   By: Lovena Le M.D.   On: 12/09/2020 22:45    Wt Readings from Last 3 Encounters:  12/09/20 74.8 kg  10/02/20 68 kg  09/18/20 66.2 kg    EKG: nsr  Physical Exam:  Blood pressure (!) 149/97, pulse (!) 57, temperature 99.1 F (37.3 C), temperature source Oral, resp. rate 17, height 5\' 7"  (1.702 m), weight 74.8 kg, SpO2 97 %. Body mass index is 25.84 kg/m. General: Well developed, well nourished, in no acute distress. Head: Normocephalic, atraumatic, sclera non-icteric, no xanthomas, nares are without discharge.  Neck: Negative for carotid bruits.  JVD not elevated. Lungs: Clear bilaterally to auscultation without wheezes, rales, or rhonchi. Breathing is unlabored. Heart: RRR with S1 S2. No murmurs, rubs, or gallops appreciated. Abdomen: Soft, non-tender, non-distended with normoactive bowel sounds. No hepatomegaly. No rebound/guarding. No obvious abdominal masses. Msk:  Strength and tone appear normal for age. Extremities: No clubbing or cyanosis. No edema.  Distal pedal pulses are 2+ and equal bilaterally. Neuro: Alert and oriented X 3. No facial asymmetry. No focal deficit. Moves all extremities spontaneously. Psych:  Responds to questions appropriately with a normal affect.     Assessment and Plan    87 y.o. male with history of dementia, anxiety, lymphoma in remission, hypertension and history of lower extremity edema with as needed Lasix.  Presents emergency room on a nonrebreather.  Patiently was increased shortness of breath.  Was 60% oxygenation on room air per EMS.  Chest x-ray in the emergency room revealed mild pulmonary edema.  BNP was 2103.  Baseline not available.  High sensitive troponins minimally elevated consistent with demand.  EKG revealed sinus rhythm with nonspecific ST-T wave changes.  No ischemic abnormalities.  Been placed on IV Lasix.  Echo pending.  Renal insufficiency although creatinine is at its baseline.  CBC and hemoglobin normal.  1.  Respiratory distress-still on nonrebreather.  Likely multifactorial.  Chest x-ray suggested pulmonary edema with elevated BNP.  Also appears to have underlying opacities.  We will continue with BiPAP and bronchodilators.  We will continue to diurese following renal function.  Echocardiogram is pending.  2.  CHF-as per above.  Chest x-ray suggested possible pulmonary edema with elevated BNP.  We will continue with careful diuresis and bronchodilators.  Continue with afterload reduction with losartan.  Appears to rule out for myocardial infarction.  Mildly elevated troponin  consistent with demand.  3.  Hypertension-continue with losartan and diuretics. Signed, Teodoro Spray MD 12/10/2020, 9:46 AM Pager: [phone]

## 2020-12-10 NOTE — Progress Notes (Signed)
*  PRELIMINARY RESULTS* Echocardiogram 2D Echocardiogram has been performed.  Sherrie Sport 12/10/2020, 12:07 PM

## 2020-12-10 NOTE — Progress Notes (Signed)
PT Cancellation Note  Patient Details Name: Manuel Reynolds MRN: [id number] DOB: Mar 19, 1930   Cancelled Treatment:    Reason Eval/Treat Not Completed: Medical issues which prohibited therapy. PT orders received, chart reviewed. Per notes, pt with increased agitation & requiring Bipap. Spoke with nurse who confirms pt is on bipap & reports pt "cannot do PT today". Will hold PT evaluation at this time & reattempt as able & as pt is medically appropriate.  [name], PT, DPT 12/10/20, 10:07 AM    [name] 12/10/2020, 10:06 AM

## 2020-12-10 NOTE — Progress Notes (Signed)
PROGRESS NOTE    Manuel Reynolds  ZOX:[id number] DOB: 1930-05-04 DOA: 12/09/2020 PCP: [name], DO    Brief Narrative:  87 y.o. male with medical history significant of  Dementia Alzheimer's, anxiety, hypothyroidism, Lymphoma in remission,Hypertension,b/l lower extremity edema on prn lasix., CKDIII who presents to ed BIB EMS on NRB 15L/min sat 95%.Of note patient is a poor historian and history is taken from chart. Per chart patient has acute shortness of breath that started immediately prior to presentation. Per wife patient complained of increase sob while eating dinner. She also noted per ED MD that she heard crackling noises coming from his chest. Due to continued symptoms x 1 hour she called EMS.  In the field per noted patient sat on arrival of EMS was 60% on ra. Of note ed coarse complicated by episode of agitation, with patient pulling of bipap he was treated with one dose of droperidol 2.5mg  iv.  Assessment & Plan:   Active Problems:   CHF (congestive heart failure) ([place])   New onset CHF with associated acute hypoxic respiratory failure -concern for flash with acute onset /and uncontrolled bp on admission -Initially required bipap in ed, now weaned to [facility] -nitro paste -continued on lasix 60 mg iv bid  -2d echo with normal LVEF50-55% with moderate LVH -Cardiology following, recs to cont diuresis and cont afterload reduction with losartan  Hypertensive Emergency  -uncontrolled BP on admission  -nitro paste x 1, prn medications  -cont on home regimen -BP now better controlled  CAP -noted hypoxemia on presentation -patient with increase wbc and  Opacities on cxr unable to r/o infection  -ctx/ azithromycin was started empirically, de-escalate as able  -pulmonary toilet  -urine ag, sputum, f/u on culture data  -Will check procalcitonin. If low or neg, would d/c abx -repeat cbc in AM. WBC has since normalized  Dementia Alzheimer's -late stage per last pcp  note   Anxiety -not currently on medications  -increased with bipap, tx x 2 in ed -started on precedex, in order to tolerate bipap  -Weaning precedex, started PRN ativan and haldol   hypothyroidism -continue synthroid  -TSH is within normal limits   CKDIII  -noted to be at baseline  -repeat cmp in AM   DVT prophylaxis: Lovenox subq Code Status: Full Family Communication: Pt in room, family not at bedside  Status is: Inpatient  Remains inpatient appropriate because:Ongoing diagnostic testing needed not appropriate for outpatient work up, Unsafe d/c plan, IV treatments appropriate due to intensity of illness or inability to take PO and Inpatient level of care appropriate due to severity of illness   Dispo: The patient is from: Home              Anticipated d/c is to: Unclear at this time. Will need PT/OT eval when more stable for d/c planning              Anticipated d/c date is: > 3 days              Patient currently is not medically stable to d/c.    Consultants:   Cardiology  Procedures:     Antimicrobials: Anti-infectives (From admission, onward)   Start     Dose/Rate Route Frequency Ordered Stop   12/10/20 0130  cefTRIAXone (ROCEPHIN) 2 g in sodium chloride 0.9 % 100 mL IVPB  Status:  Discontinued        2 g 200 mL/hr over 30 Minutes Intravenous Every 24 hours 12/10/20 0127 12/10/20 0139  12/10/20 0130  azithromycin (ZITHROMAX) 500 mg in sodium chloride 0.9 % 250 mL IVPB  Status:  Discontinued        500 mg 250 mL/hr over 60 Minutes Intravenous Every 24 hours 12/10/20 0127 12/10/20 0139   12/09/20 2300  cefTRIAXone (ROCEPHIN) 2 g in sodium chloride 0.9 % 100 mL IVPB        2 g 200 mL/hr over 30 Minutes Intravenous Every 24 hours 12/09/20 2246     12/09/20 2300  azithromycin (ZITHROMAX) 500 mg in sodium chloride 0.9 % 250 mL IVPB        500 mg 250 mL/hr over 60 Minutes Intravenous Every 24 hours 12/09/20 2246         Subjective: Unable to assess  given mentation  Objective: Vitals:   12/10/20 1430 12/10/20 1500 12/10/20 1530 12/10/20 1603  BP: (!) 152/87 (!) 149/86 (!) 144/89   Pulse: (!) 48 (!) 47 (!) 46   Resp: 16 16 15    Temp:      TempSrc:      SpO2: 99% 100% 100% 99%  Weight:      Height:        Intake/Output Summary (Last 24 hours) at 12/10/2020 1726 Last data filed at 12/10/2020 1407 Gross per 24 hour  Intake 460.4 ml  Output 850 ml  Net -389.6 ml   Filed Weights   12/09/20 2159  Weight: 74.8 kg    Examination:  General exam: Appears calm and comfortable  Respiratory system: Clear to auscultation. Respiratory effort normal. Cardiovascular system: S1 & S2 heard, Regular Gastrointestinal system: Abdomen is nondistended, soft and nontender. No organomegaly or masses felt. Normal bowel sounds heard. Central nervous system: no tremors. No focal neurological deficits. Extremities: Symmetric 5 x 5 power. Skin: No rashes, lesions  Psychiatry: unable to assess given mentation.   Data Reviewed: I have personally reviewed following labs and imaging studies  CBC: Recent Labs  Lab 12/09/20 2207 12/10/20 0739  WBC 14.0* 6.0  NEUTROABS 7.2  --   HGB 14.7 14.5  HCT 45.8 43.0  MCV 88.9 86.9  PLT 277 [id number]   Basic Metabolic Panel: Recent Labs  Lab 12/09/20 2207 12/10/20 0739  NA 143  --   K 4.2  --   CL 109  --   CO2 23  --   GLUCOSE 217*  --   BUN 31*  --   CREATININE 1.15 0.99  CALCIUM 8.7*  --    GFR: Estimated Creatinine Clearance: 48.2 mL/min (by C-G formula based on SCr of 0.99 mg/dL). Liver Function Tests: No results for input(s): AST, ALT, ALKPHOS, BILITOT, PROT, ALBUMIN in the last 168 hours. No results for input(s): LIPASE, AMYLASE in the last 168 hours. No results for input(s): AMMONIA in the last 168 hours. Coagulation Profile: Recent Labs  Lab 12/09/20 2207  INR 1.1   Cardiac Enzymes: No results for input(s): CKTOTAL, CKMB, CKMBINDEX, TROPONINI in the last 168 hours. BNP (last 3  results) No results for input(s): PROBNP in the last 8760 hours. HbA1C: No results for input(s): HGBA1C in the last 72 hours. CBG: No results for input(s): GLUCAP in the last 168 hours. Lipid Profile: No results for input(s): CHOL, HDL, LDLCALC, TRIG, CHOLHDL, LDLDIRECT in the last 72 hours. Thyroid Function Tests: Recent Labs    12/10/20 0739  TSH 3.720   Anemia Panel: No results for input(s): VITAMINB12, FOLATE, FERRITIN, TIBC, IRON, RETICCTPCT in the last 72 hours. Sepsis Labs: Recent Labs  Lab 12/09/20 2253  LATICACIDVEN 1.5    Recent Results (from the past 240 hour(s))  Resp Panel by RT-PCR (Flu A&B, Covid) Nasopharyngeal Swab     Status: None   Collection Time: 12/09/20 10:18 PM   Specimen: Nasopharyngeal Swab; Nasopharyngeal(NP) swabs in vial transport medium  Result Value Ref Range Status   SARS Coronavirus 2 by RT PCR NEGATIVE NEGATIVE Final    Comment: (NOTE) SARS-CoV-2 target nucleic acids are NOT DETECTED.  The SARS-CoV-2 RNA is generally detectable in upper respiratory specimens during the acute phase of infection. The lowest concentration of SARS-CoV-2 viral copies this assay can detect is 138 copies/mL. A negative result does not preclude SARS-Cov-2 infection and should not be used as the sole basis for treatment or other patient management decisions. A negative result may occur with  improper specimen collection/handling, submission of specimen other than nasopharyngeal swab, presence of viral mutation(s) within the areas targeted by this assay, and inadequate number of viral copies(<138 copies/mL). A negative result must be combined with clinical observations, patient history, and epidemiological information. The expected result is Negative.  Fact Sheet for Patients:  EntrepreneurPulse.com.au  Fact Sheet for Healthcare Providers:  IncredibleEmployment.be  This test is no t yet approved or cleared by the [place] FDA and  has been authorized for detection and/or diagnosis of SARS-CoV-2 by FDA under an Emergency Use Authorization (EUA). This EUA will remain  in effect (meaning this test can be used) for the duration of the COVID-19 declaration under Section 564(b)(1) of the Act, 21 U.S.C.section 360bbb-3(b)(1), unless the authorization is terminated  or revoked sooner.       Influenza A by PCR NEGATIVE NEGATIVE Final   Influenza B by PCR NEGATIVE NEGATIVE Final    Comment: (NOTE) The Xpert Xpress SARS-CoV-2/FLU/RSV plus assay is intended as an aid in the diagnosis of influenza from Nasopharyngeal swab specimens and should not be used as a sole basis for treatment. Nasal washings and aspirates are unacceptable for Xpert Xpress SARS-CoV-2/FLU/RSV testing.  Fact Sheet for Patients: EntrepreneurPulse.com.au  Fact Sheet for Healthcare Providers: IncredibleEmployment.be  This test is not yet approved or cleared by the [place] FDA and has been authorized for detection and/or diagnosis of SARS-CoV-2 by FDA under an Emergency Use Authorization (EUA). This EUA will remain in effect (meaning this test can be used) for the duration of the COVID-19 declaration under Section 564(b)(1) of the Act, 21 U.S.C. section 360bbb-3(b)(1), unless the authorization is terminated or revoked.  Performed at [facility], [address] [place], [place] [address]   Culture, blood (Routine X 2) w Reflex to ID Panel     Status: None (Preliminary result)   Collection Time: 12/09/20 10:53 PM   Specimen: BLOOD  Result Value Ref Range Status   Specimen Description BLOOD BLOOD LEFT FOREARM  Final   Special Requests   Final    BOTTLES DRAWN AEROBIC AND ANAEROBIC Blood Culture results may not be optimal due to an inadequate volume of blood received in culture bottles   Culture   Final    NO GROWTH < 12 HOURS Performed at [facility], [address] [place], [place] [address]    Report Status PENDING  Incomplete  Culture, blood (Routine X 2) w Reflex to ID Panel     Status: None (Preliminary result)   Collection Time: 12/09/20 10:54 PM   Specimen: BLOOD  Result Value Ref Range Status   Specimen Description BLOOD BLOOD LEFT FOREARM  Final   Special Requests  Final    BOTTLES DRAWN AEROBIC AND ANAEROBIC Blood Culture results may not be optimal due to an excessive volume of blood received in culture bottles   Culture   Final    NO GROWTH < 12 HOURS Performed at [facility], [address] [place], [place] [address]    Report Status PENDING  Incomplete     Radiology Studies: DG Chest Portable 1 View  Result Date: 12/09/2020 CLINICAL DATA:  Quit dyspnea, hypoxic, question fluid overload EXAM: PORTABLE CHEST 1 VIEW COMPARISON:  None. FINDINGS: Areas of mixed hazy interstitial and patchy opacity are present in both lungs with a perihilar and basilar predominance. Some additional areas of more coalescent opacity are seen in the medial lung bases with bandlike opacities in both lower lobes, possibly scarring or subsegmental atelectatic change. The pulmonary vascularity is indistinct. Small bilateral effusions. No pneumothoraces. The cardiac silhouette is likely enlarged, possibly cardiomegaly or pericardial effusion. The aorta is calcified. The remaining cardiomediastinal contours are unremarkable. No acute osseous or soft tissue abnormality. Degenerative changes are present in the imaged spine and shoulders. Telemetry leads and external support devices overlie the chest. IMPRESSION: 1. Features suggestive of CHF/volume overload with pulmonary edema and bilateral effusions. 2. More patchy and coalescent opacities could reflect developing alveolar edema though underlying infection is difficult to exclude. 3. Enlarged cardiac silhouette, possibly cardiomegaly or pericardial effusion. Electronically Signed   By: Lovena Le M.D.   On:  12/09/2020 22:45   ECHOCARDIOGRAM COMPLETE  Result Date: 12/10/2020    ECHOCARDIOGRAM REPORT   Patient Name:   Manuel Reynolds Date of Exam: 12/10/2020 Medical Rec #:  [id number]    Height:       67.0 in Accession #:    [id number]   Weight:       165.0 lb Date of Birth:  [birth year]    BSA:          1.863 m Patient Age:    65 years     BP:           148/93 mmHg Patient Gender: M            HR:           53 bpm. Exam Location:  [facility] Procedure: 2D Echo and Cardiac Doppler Indications:     CHF-acute systolic [id number]  History:         Patient has no prior history of Echocardiogram examinations.                  Risk Factors:Hypertension.  Sonographer:     Sherrie Sport RDCS (AE) Referring Phys:  [id number] Victoriano Lain A THOMAS Diagnosing Phys: Kathlyn Sacramento MD  Sonographer Comments: Technically challenging study due to limited acoustic windows, no apical window and suboptimal parasternal window. Image acquisition challenging due to patient body habitus. IMPRESSIONS  1. Left ventricular ejection fraction, by estimation, is 50 to 55%. The left ventricle has low normal function. Left ventricular endocardial border not optimally defined to evaluate regional wall motion. There is moderate left ventricular hypertrophy. Left ventricular diastolic parameters are indeterminate.  2. Right ventricular systolic function is normal. The right ventricular size is normal.  3. Moderate pleural effusion in the left lateral region.  4. The mitral valve is normal in structure. No evidence of mitral valve regurgitation. No evidence of mitral stenosis.  5. The aortic valve is normal in structure. Aortic valve regurgitation is not visualized. Mild to moderate aortic valve sclerosis. There is likely some degree of  aortic stenosis. However, no gradient was recorded.  6. The inferior vena cava is normal in size with greater than 50% respiratory variability, suggesting right atrial pressure of 3 mmHg. FINDINGS  Left Ventricle: Left ventricular ejection  fraction, by estimation, is 50 to 55%. The left ventricle has low normal function. Left ventricular endocardial border not optimally defined to evaluate regional wall motion. The left ventricular internal cavity  size was normal in size. There is moderate left ventricular hypertrophy. Left ventricular diastolic parameters are indeterminate. Right Ventricle: The right ventricular size is normal. No increase in right ventricular wall thickness. Right ventricular systolic function is normal. Left Atrium: Left atrial size was normal in size. Right Atrium: Right atrial size was normal in size. Pericardium: There is no evidence of pericardial effusion. Mitral Valve: The mitral valve is normal in structure. No evidence of mitral valve regurgitation. No evidence of mitral valve stenosis. Tricuspid Valve: The tricuspid valve is normal in structure. Tricuspid valve regurgitation is not demonstrated. No evidence of tricuspid stenosis. Aortic Valve: The aortic valve is normal in structure. Aortic valve regurgitation is not visualized. Mild to moderate aortic valve sclerosis/calcification is present, without any evidence of aortic stenosis. Pulmonic Valve: The pulmonic valve was normal in structure. Pulmonic valve regurgitation is not visualized. No evidence of pulmonic stenosis. Aorta: The aortic root is normal in size and structure. Venous: The inferior vena cava is normal in size with greater than 50% respiratory variability, suggesting right atrial pressure of 3 mmHg. IAS/Shunts: No atrial level shunt detected by color flow Doppler. Additional Comments: There is a moderate pleural effusion in the left lateral region.  LEFT VENTRICLE PLAX 2D LVIDd:         3.10 cm LVIDs:         2.80 cm LV PW:         1.50 cm LV IVS:        1.80 cm LVOT diam:     2.00 cm LVOT Area:     3.14 cm  LEFT ATRIUM         Index LA diam:    3.60 cm 1.93 cm/m                        PULMONIC VALVE AORTA                 PV Vmax:        0.54 m/s Ao Root  diam: 3.20 cm PV Peak grad:   1.1 mmHg                       RVOT Peak grad: 2 mmHg   SHUNTS Systemic Diam: 2.00 cm Kathlyn Sacramento MD Electronically signed by Kathlyn Sacramento MD Signature Date/Time: 12/10/2020/1:37:06 PM    Final     Scheduled Meds: . aspirin EC  81 mg Oral Daily  . enoxaparin (LOVENOX) injection  40 mg Subcutaneous Q24H  . furosemide  60 mg Intravenous Q12H  . levothyroxine  50 mcg Oral Q0600  . losartan  50 mg Oral Daily  . simvastatin  40 mg Oral q1800  . sodium chloride flush  3 mL Intravenous Q12H   Continuous Infusions: . sodium chloride    . azithromycin Stopped (12/10/20 0055)  . cefTRIAXone (ROCEPHIN)  IV Stopped (12/09/20 2336)  . dexmedetomidine (PRECEDEX) IV infusion Stopped (12/10/20 1407)     LOS: 0 days   Marylu Lund, MD [facility] Pager On Amion  If 7PM-7AM, please contact night-coverage 12/10/2020, 5:26 PM

## 2020-12-10 NOTE — ED Notes (Signed)
Date and time results received: 12/10/20 0924AM (use smartphrase ".now" to insert current time)  Test: Troponin  Critical Value: 45  Name of Provider Notified: Dr. Wyline Copas  Orders Received? Or Actions Taken?: no new orders at this time

## 2020-12-10 NOTE — ED Notes (Signed)
Patient is now more relaxed, not pulling at bipap. Heart rate 46 BMP consistently Manuel Dago MD to possible DC precedex and possibly have repiratory DC bipap and change to high flow. Awaiting further instruction.

## 2020-12-10 NOTE — ED Notes (Signed)
Pt removed BIPAP and stated he would not put it back on due to anxiety. NRB put on pt at 15L. RT called and MD notified.

## 2020-12-11 DIAGNOSIS — I509 Heart failure, unspecified: Secondary | ICD-10-CM

## 2020-12-11 LAB — BASIC METABOLIC PANEL
Anion gap: 15 (ref 5–15)
BUN: 32 mg/dL — ABNORMAL HIGH (ref 8–23)
CO2: 25 mmol/L (ref 22–32)
Calcium: 9 mg/dL (ref 8.9–10.3)
Chloride: 104 mmol/L (ref 98–111)
Creatinine, Ser: 1.16 mg/dL (ref 0.61–1.24)
GFR, Estimated: >60 mL/min (ref >60)
Glucose, Bld: 102 mg/dL — ABNORMAL HIGH (ref 70–99)
Potassium: 3.2 mmol/L — ABNORMAL LOW (ref 3.5–5.1)
Sodium: 144 mmol/L (ref 135–145)

## 2020-12-11 LAB — LEGIONELLA PNEUMOPHILA SEROGP 1 UR AG: L. pneumophila Serogp 1 Ur Ag: NEGATIVE

## 2020-12-11 MED ORDER — POTASSIUM CHLORIDE CRYS ER 20 MEQ PO TBCR
40.0000 meq | EXTENDED_RELEASE_TABLET | Freq: Two times a day (BID) | ORAL | Status: AC
  Administered 2020-12-11 (×2): 40 meq via ORAL
  Filled 2020-12-11 (×2): qty 2

## 2020-12-11 NOTE — Progress Notes (Signed)
PROGRESS NOTE    Manuel Reynolds  [id number] DOB: June 17, 1930 DOA: 12/09/2020 PCP: [name], DO    Brief Narrative:  87 y.o. male with medical history significant of  Dementia Alzheimer's, anxiety, hypothyroidism, Lymphoma in remission,Hypertension,b/l lower extremity edema on prn lasix., CKDIII who presents to ed BIB EMS on NRB 15L/min sat 95%.Of note patient is a poor historian and history is taken from chart. Per chart patient has acute shortness of breath that started immediately prior to presentation. Per wife patient complained of increase sob while eating dinner. She also noted per ED MD that she heard crackling noises coming from his chest. Due to continued symptoms x 1 hour she called EMS.  In the field per noted patient sat on arrival of EMS was 60% on ra. Of note ed coarse complicated by episode of agitation, with patient pulling of bipap he was treated with one dose of droperidol 2.5mg  iv.  Assessment & Plan:   Active Problems:   CHF (congestive heart failure) (HCC)   CHF exacerbation (HCC)   New onset CHF with associated acute hypoxic respiratory failure -concern for flash with acute onset /and uncontrolled bp on admission -Initially required bipap in ed, now weaned to [facility] -nitro paste -continued on lasix 60 mg iv bid per cardiology recs -2d echo with normal LVEF50-55% with moderate LVH -Cardiology continues to follow, recs to cont diuresis with IV lasix with potassium replacement -recheck bmet in AM  Hypertensive Emergency  -uncontrolled BP on admission  -nitro paste x 1, prn medications  -cont on home regimen -BP now stable  CAP ruled out -noted hypoxemia on presentation -patient with increase wbc and  Opacities on cxr unable to r/o infection  -ctx/ azithromycin was started empirically, de-escalate as able  -pulmonary toilet  -Procalcitonin is <0.01, thus likely not infectious  Dementia Alzheimer's -late stage per last pcp note    Anxiety -not currently on medications  -increased with bipap, tx x 2 in ed -started on precedex, in order to tolerate bipap  -Weaned precedex to off, on PRN ativan and haldol -Mentation seems stable this AM   hypothyroidism -continue synthroid  -TSH is within normal limits, reviewed   CKDIII  -noted to be at baseline  -recheck cmp in AM   DVT prophylaxis: Lovenox subq Code Status: Full Family Communication: Pt in room, updated pt's wife over phone 1/17  Status is: Inpatient  Remains inpatient appropriate because:Ongoing diagnostic testing needed not appropriate for outpatient work up, Unsafe d/c plan, IV treatments appropriate due to intensity of illness or inability to take PO and Inpatient level of care appropriate due to severity of illness   Dispo: The patient is from: Home              Anticipated d/c is to: Unclear at this time. Will need PT/OT eval when more stable for d/c planning              Anticipated d/c date is: > 3 days              Patient currently is not medically stable to d/c.    Consultants:   Cardiology  Procedures:     Antimicrobials: Anti-infectives (From admission, onward)   Start     Dose/Rate Route Frequency Ordered Stop   12/10/20 0130  cefTRIAXone (ROCEPHIN) 2 g in sodium chloride 0.9 % 100 mL IVPB  Status:  Discontinued        2 g 200 mL/hr over 30 Minutes Intravenous Every 24  hours 12/10/20 0127 12/10/20 0139   12/10/20 0130  azithromycin (ZITHROMAX) 500 mg in sodium chloride 0.9 % 250 mL IVPB  Status:  Discontinued        500 mg 250 mL/hr over 60 Minutes Intravenous Every 24 hours 12/10/20 0127 12/10/20 0139   12/09/20 2300  cefTRIAXone (ROCEPHIN) 2 g in sodium chloride 0.9 % 100 mL IVPB  Status:  Discontinued        2 g 200 mL/hr over 30 Minutes Intravenous Every 24 hours 12/09/20 2246 12/11/20 0808   12/09/20 2300  azithromycin (ZITHROMAX) 500 mg in sodium chloride 0.9 % 250 mL IVPB  Status:  Discontinued        500 mg 250  mL/hr over 60 Minutes Intravenous Every 24 hours 12/09/20 2246 12/11/20 0808      Subjective: Without complaints. Denies chest pain  Objective: Vitals:   12/11/20 1615 12/11/20 1630 12/11/20 1645 12/11/20 1700  BP:      Pulse: (!) 103 (!) 101 (!) 102 (!) 101  Resp: (!) 24 (!) 24 (!) 28 17  Temp:      TempSrc:      SpO2: 100% 100% 100% 100%  Weight:      Height:        Intake/Output Summary (Last 24 hours) at 12/11/2020 1742 Last data filed at 12/10/2020 2146 Gross per 24 hour  Intake 100 ml  Output --  Net 100 ml   Filed Weights   12/09/20 2159  Weight: 74.8 kg    Examination: General exam: Awake, laying in bed, in nad Respiratory system: Normal respiratory effort, no wheezing Cardiovascular system: regular rate, s1, s2 Gastrointestinal system: Soft, nondistended, positive BS Central nervous system: CN2-12 grossly intact, strength intact Extremities: Perfused, no clubbing Skin: Normal skin turgor, no notable skin lesions seen Psychiatry: Mood normal // no visual hallucinations   Data Reviewed: I have personally reviewed following labs and imaging studies  CBC: Recent Labs  Lab 12/09/20 2207 12/10/20 0739  WBC 14.0* 6.0  NEUTROABS 7.2  --   HGB 14.7 14.5  HCT 45.8 43.0  MCV 88.9 86.9  PLT 277 [id number]   Basic Metabolic Panel: Recent Labs  Lab 12/09/20 2207 12/10/20 0739 12/11/20 0516  NA 143  --  144  K 4.2  --  3.2*  CL 109  --  104  CO2 23  --  25  GLUCOSE 217*  --  102*  BUN 31*  --  32*  CREATININE 1.15 0.99 1.16  CALCIUM 8.7*  --  9.0   GFR: Estimated Creatinine Clearance: 41.2 mL/min (by C-G formula based on SCr of 1.16 mg/dL). Liver Function Tests: No results for input(s): AST, ALT, ALKPHOS, BILITOT, PROT, ALBUMIN in the last 168 hours. No results for input(s): LIPASE, AMYLASE in the last 168 hours. No results for input(s): AMMONIA in the last 168 hours. Coagulation Profile: Recent Labs  Lab 12/09/20 2207  INR 1.1   Cardiac  Enzymes: No results for input(s): CKTOTAL, CKMB, CKMBINDEX, TROPONINI in the last 168 hours. BNP (last 3 results) No results for input(s): PROBNP in the last 8760 hours. HbA1C: No results for input(s): HGBA1C in the last 72 hours. CBG: No results for input(s): GLUCAP in the last 168 hours. Lipid Profile: No results for input(s): CHOL, HDL, LDLCALC, TRIG, CHOLHDL, LDLDIRECT in the last 72 hours. Thyroid Function Tests: Recent Labs    12/10/20 0739  TSH 3.720   Anemia Panel: No results for input(s): VITAMINB12, FOLATE, FERRITIN, TIBC, IRON, RETICCTPCT  in the last 72 hours. Sepsis Labs: Recent Labs  Lab 12/09/20 2253 12/10/20 0739  PROCALCITON  --  <0.10  LATICACIDVEN 1.5  --     Recent Results (from the past 240 hour(s))  Resp Panel by RT-PCR (Flu A&B, Covid) Nasopharyngeal Swab     Status: None   Collection Time: 12/09/20 10:18 PM   Specimen: Nasopharyngeal Swab; Nasopharyngeal(NP) swabs in vial transport medium  Result Value Ref Range Status   SARS Coronavirus 2 by RT PCR NEGATIVE NEGATIVE Final    Comment: (NOTE) SARS-CoV-2 target nucleic acids are NOT DETECTED.  The SARS-CoV-2 RNA is generally detectable in upper respiratory specimens during the acute phase of infection. The lowest concentration of SARS-CoV-2 viral copies this assay can detect is 138 copies/mL. A negative result does not preclude SARS-Cov-2 infection and should not be used as the sole basis for treatment or other patient management decisions. A negative result may occur with  improper specimen collection/handling, submission of specimen other than nasopharyngeal swab, presence of viral mutation(s) within the areas targeted by this assay, and inadequate number of viral copies(<138 copies/mL). A negative result must be combined with clinical observations, patient history, and epidemiological information. The expected result is Negative.  Fact Sheet for Patients:   EntrepreneurPulse.com.au  Fact Sheet for Healthcare Providers:  IncredibleEmployment.be  This test is no t yet approved or cleared by the [place] FDA and  has been authorized for detection and/or diagnosis of SARS-CoV-2 by FDA under an Emergency Use Authorization (EUA). This EUA will remain  in effect (meaning this test can be used) for the duration of the COVID-19 declaration under Section 564(b)(1) of the Act, 21 U.S.C.section 360bbb-3(b)(1), unless the authorization is terminated  or revoked sooner.       Influenza A by PCR NEGATIVE NEGATIVE Final   Influenza B by PCR NEGATIVE NEGATIVE Final    Comment: (NOTE) The Xpert Xpress SARS-CoV-2/FLU/RSV plus assay is intended as an aid in the diagnosis of influenza from Nasopharyngeal swab specimens and should not be used as a sole basis for treatment. Nasal washings and aspirates are unacceptable for Xpert Xpress SARS-CoV-2/FLU/RSV testing.  Fact Sheet for Patients: EntrepreneurPulse.com.au  Fact Sheet for Healthcare Providers: IncredibleEmployment.be  This test is not yet approved or cleared by the [place] FDA and has been authorized for detection and/or diagnosis of SARS-CoV-2 by FDA under an Emergency Use Authorization (EUA). This EUA will remain in effect (meaning this test can be used) for the duration of the COVID-19 declaration under Section 564(b)(1) of the Act, 21 U.S.C. section 360bbb-3(b)(1), unless the authorization is terminated or revoked.  Performed at [facility], [address] [place], [place] [address]   Culture, blood (Routine X 2) w Reflex to ID Panel     Status: None (Preliminary result)   Collection Time: 12/09/20 10:53 PM   Specimen: BLOOD  Result Value Ref Range Status   Specimen Description BLOOD BLOOD LEFT FOREARM  Final   Special Requests   Final    BOTTLES DRAWN AEROBIC AND ANAEROBIC Blood Culture  results may not be optimal due to an inadequate volume of blood received in culture bottles   Culture   Final    NO GROWTH 2 DAYS Performed at [facility], [address], [place], [place] [address]    Report Status PENDING  Incomplete  Culture, blood (Routine X 2) w Reflex to ID Panel     Status: None (Preliminary result)   Collection Time: 12/09/20 10:54 PM  Specimen: BLOOD  Result Value Ref Range Status   Specimen Description BLOOD BLOOD LEFT FOREARM  Final   Special Requests   Final    BOTTLES DRAWN AEROBIC AND ANAEROBIC Blood Culture results may not be optimal due to an excessive volume of blood received in culture bottles   Culture   Final    NO GROWTH 2 DAYS Performed at [facility], [address] [place], [place] [address]    Report Status PENDING  Incomplete     Radiology Studies: DG Chest Portable 1 View  Result Date: 12/09/2020 CLINICAL DATA:  Quit dyspnea, hypoxic, question fluid overload EXAM: PORTABLE CHEST 1 VIEW COMPARISON:  None. FINDINGS: Areas of mixed hazy interstitial and patchy opacity are present in both lungs with a perihilar and basilar predominance. Some additional areas of more coalescent opacity are seen in the medial lung bases with bandlike opacities in both lower lobes, possibly scarring or subsegmental atelectatic change. The pulmonary vascularity is indistinct. Small bilateral effusions. No pneumothoraces. The cardiac silhouette is likely enlarged, possibly cardiomegaly or pericardial effusion. The aorta is calcified. The remaining cardiomediastinal contours are unremarkable. No acute osseous or soft tissue abnormality. Degenerative changes are present in the imaged spine and shoulders. Telemetry leads and external support devices overlie the chest. IMPRESSION: 1. Features suggestive of CHF/volume overload with pulmonary edema and bilateral effusions. 2. More patchy and coalescent opacities could reflect developing alveolar edema though  underlying infection is difficult to exclude. 3. Enlarged cardiac silhouette, possibly cardiomegaly or pericardial effusion. Electronically Signed   By: Lovena Le M.D.   On: 12/09/2020 22:45   ECHOCARDIOGRAM COMPLETE  Result Date: 12/10/2020    ECHOCARDIOGRAM REPORT   Patient Name:   Manuel Reynolds Date of Exam: 12/10/2020 Medical Rec #:  [id number]    Height:       67.0 in Accession #:    [id number]   Weight:       165.0 lb Date of Birth:  02/26/1930    BSA:          1.863 m Patient Age:    5 years     BP:           148/93 mmHg Patient Gender: M            HR:           53 bpm. Exam Location:  [facility] Procedure: 2D Echo and Cardiac Doppler Indications:     CHF-acute systolic P95.09  History:         Patient has no prior history of Echocardiogram examinations.                  Risk Factors:Hypertension.  Sonographer:     Sherrie Sport RDCS (AE) Referring Phys:  [id number] Victoriano Lain A THOMAS Diagnosing Phys: Kathlyn Sacramento MD  Sonographer Comments: Technically challenging study due to limited acoustic windows, no apical window and suboptimal parasternal window. Image acquisition challenging due to patient body habitus. IMPRESSIONS  1. Left ventricular ejection fraction, by estimation, is 50 to 55%. The left ventricle has low normal function. Left ventricular endocardial border not optimally defined to evaluate regional wall motion. There is moderate left ventricular hypertrophy. Left ventricular diastolic parameters are indeterminate.  2. Right ventricular systolic function is normal. The right ventricular size is normal.  3. Moderate pleural effusion in the left lateral region.  4. The mitral valve is normal in structure. No evidence of mitral valve regurgitation. No evidence of mitral stenosis.  5. The aortic  valve is normal in structure. Aortic valve regurgitation is not visualized. Mild to moderate aortic valve sclerosis. There is likely some degree of aortic stenosis. However, no gradient was recorded.  6. The  inferior vena cava is normal in size with greater than 50% respiratory variability, suggesting right atrial pressure of 3 mmHg. FINDINGS  Left Ventricle: Left ventricular ejection fraction, by estimation, is 50 to 55%. The left ventricle has low normal function. Left ventricular endocardial border not optimally defined to evaluate regional wall motion. The left ventricular internal cavity  size was normal in size. There is moderate left ventricular hypertrophy. Left ventricular diastolic parameters are indeterminate. Right Ventricle: The right ventricular size is normal. No increase in right ventricular wall thickness. Right ventricular systolic function is normal. Left Atrium: Left atrial size was normal in size. Right Atrium: Right atrial size was normal in size. Pericardium: There is no evidence of pericardial effusion. Mitral Valve: The mitral valve is normal in structure. No evidence of mitral valve regurgitation. No evidence of mitral valve stenosis. Tricuspid Valve: The tricuspid valve is normal in structure. Tricuspid valve regurgitation is not demonstrated. No evidence of tricuspid stenosis. Aortic Valve: The aortic valve is normal in structure. Aortic valve regurgitation is not visualized. Mild to moderate aortic valve sclerosis/calcification is present, without any evidence of aortic stenosis. Pulmonic Valve: The pulmonic valve was normal in structure. Pulmonic valve regurgitation is not visualized. No evidence of pulmonic stenosis. Aorta: The aortic root is normal in size and structure. Venous: The inferior vena cava is normal in size with greater than 50% respiratory variability, suggesting right atrial pressure of 3 mmHg. IAS/Shunts: No atrial level shunt detected by color flow Doppler. Additional Comments: There is a moderate pleural effusion in the left lateral region.  LEFT VENTRICLE PLAX 2D LVIDd:         3.10 cm LVIDs:         2.80 cm LV PW:         1.50 cm LV IVS:        1.80 cm LVOT diam:      2.00 cm LVOT Area:     3.14 cm  LEFT ATRIUM         Index LA diam:    3.60 cm 1.93 cm/m                        PULMONIC VALVE AORTA                 PV Vmax:        0.54 m/s Ao Root diam: 3.20 cm PV Peak grad:   1.1 mmHg                       RVOT Peak grad: 2 mmHg   SHUNTS Systemic Diam: 2.00 cm Kathlyn Sacramento MD Electronically signed by Kathlyn Sacramento MD Signature Date/Time: 12/10/2020/1:37:06 PM    Final     Scheduled Meds: . aspirin EC  81 mg Oral Daily  . enoxaparin (LOVENOX) injection  40 mg Subcutaneous Q24H  . furosemide  60 mg Intravenous Q12H  . levothyroxine  50 mcg Oral Q0600  . losartan  50 mg Oral Daily  . potassium chloride  40 mEq Oral BID  . simvastatin  40 mg Oral q1800  . sodium chloride flush  3 mL Intravenous Q12H   Continuous Infusions: . sodium chloride    . dexmedetomidine (PRECEDEX) IV infusion Stopped (12/10/20 1407)  LOS: 1 day   Marylu Lund, MD [facility] Pager On Amion  If 7PM-7AM, please contact night-coverage 12/11/2020, 5:42 PM

## 2020-12-11 NOTE — ED Notes (Signed)
Pt resting in bed, NAD noted.

## 2020-12-11 NOTE — Evaluation (Signed)
Occupational Therapy Evaluation Patient Details Name: Manuel Reynolds MRN: [id number] DOB: 1930-07-14 Today's Date: 12/11/2020    History of Present Illness Patient is an 87 year old male whose wife reports patient c/o of shortness of breath while eating dinner, could hear crackling noises coming from his chest. She called EMS. EMS noted [id number] on RA, complicated by agitation with patient pulling off bipap. Arrived to ED on NRB 15L/min sat 95% PMh includes dementia, alzheimers, anxiety, hypothyroidism, lymphoma in remission, BLE edema on prm lasix, CKD stage III.   Clinical Impression   Patient presenting with decreased I in self care, balance, functional mobility, transfer, endurance, safety awareness, and cognition.  Per chart review, pt lives in [place]. He lives there with his wife and ambulates with RW and use of cane as well. She assists him with self care tasks but unsure of how much assistance he needs with each task. Pt with very tangential speech and appears internally and externally distracted throughout session. Pt needing mod - max multimodal cuing for initiation, sequencing, and safety awareness. Patient currently needing +2 assistance for functional mobility with significant posterior bias in standing. Max A +2 needed to return pt to bed. OT providing total a to don B hand mitts for safety. Patient will benefit from acute OT to increase overall independence in the areas of ADLs, functional mobility, and safety awareness in order to safely discharge to next venue of care.    Follow Up Recommendations  SNF    Equipment Recommendations  Other (comment) (defer to next venue of care)       Precautions / Restrictions Precautions Precautions: Fall Restrictions Weight Bearing Restrictions: No      Mobility Bed Mobility Overal bed mobility: Needs Assistance Bed Mobility: Supine to Sit;Sit to Supine     Supine to sit: +2 for physical assistance;Max assist;HOB elevated;Mod  assist Sit to supine: +2 for physical assistance;Max assist;HOB elevated   General bed mobility comments: Patient is confused and requires x2 assistance for transfer supine/sit.    Transfers Overall transfer level: Needs assistance Equipment used: Rolling walker (2 wheeled) Transfers: Sit to/from Stand Sit to Stand: Mod assist;Max assist;+2 physical assistance;+2 safety/equipment;From elevated surface         General transfer comment: Patient is confused, requires x2 assistance for mobility due to posterior trunk lean, excessive weight shift onto bilateral heels.    Balance Overall balance assessment: Needs assistance Sitting-balance support: Bilateral upper extremity supported Sitting balance-Leahy Scale: Poor Sitting balance - Comments: patient requires support in seated position due to posterior and lateral trunk lean. Upon attempt at kicking LE posterior trunk lean. Postural control: Posterior lean Standing balance support: Bilateral upper extremity supported;During functional activity Standing balance-Leahy Scale: Poor Standing balance comment: Patient requires heavy BUE support and stabilization x2 person assist.                           ADL either performed or assessed with clinical judgement   ADL Overall ADL's : Needs assistance/impaired     Grooming: Wash/dry hands;Wash/dry face;Total assistance Grooming Details (indicate cue type and reason): hand over hand assistance                               General ADL Comments: Pt would likely need cuing and min A for self feeding. Pt appears internally and externally distracted during this session. Toileting not performed secondary  to safety     Vision Baseline Vision/History: Wears glasses Wears Glasses: At all times Patient Visual Report: No change from baseline              Pertinent Vitals/Pain Pain Assessment: Faces Faces Pain Scale: No hurt        Extremity/Trunk Assessment  Upper Extremity Assessment Upper Extremity Assessment: Difficult to assess due to impaired cognition;Generalized weakness   Lower Extremity Assessment Lower Extremity Assessment: Defer to PT evaluation       Communication Communication Communication: HOH;Other (comment)   Cognition Arousal/Alertness: Awake/alert Behavior During Therapy: Impulsive;Restless Overall Cognitive Status: No family/caregiver present to determine baseline cognitive functioning                                 General Comments: Patient confused, inconsistant command follow, no apparent safety awareness. tangential speech throughout and unable to clearly understanding 50% of the time.   General Comments  patient appears well nourished, disheveled.    Exercises Other Exercises Other Exercises: Patient educated on role of PT in acute care setting, safe mobility and tranfers, seated and standing balance.   Shoulder Instructions      Home Living Family/patient expects to be discharged to:: Assisted living                             Home Equipment: Gilford Rile - 2 wheels;Cane - single point   Additional Comments: Patient lives with wife at [place]. Patient unable to provide history, information taken from patient's chart as wife is not present.      Prior Functioning/Environment Level of Independence: Needs assistance  Gait / Transfers Assistance Needed: Per previous documentation patient is ambulatory with walker and/or cane within home. ADL's / Homemaking Assistance Needed: Per previous documentation patient is able to feed himself but requires help from spouse for bathing, dressing, and med organization   Comments: Patient unable to provide history, requiring history to be obtained from previous documentation.        OT Problem List: Decreased strength;Decreased activity tolerance;Impaired balance (sitting and/or standing);Decreased safety  awareness;Pain;Impaired UE functional use;Cardiopulmonary status limiting activity;Decreased cognition      OT Treatment/Interventions: Self-care/ADL training;Therapeutic exercise;Patient/family education;Balance training;Energy conservation;Therapeutic activities;Cognitive remediation/compensation;DME and/or AE instruction;Manual therapy    OT Goals(Current goals can be found in the care plan section) Acute Rehab OT Goals Patient Stated Goal: patient unable to state OT Goal Formulation: With patient Time For Goal Achievement: 12/25/20 Potential to Achieve Goals: Fair ADL Goals Pt Will Perform Grooming: with supervision;standing Pt Will Perform Upper Body Dressing: with supervision;standing Pt Will Perform Lower Body Dressing: with supervision;sit to/from stand Pt Will Transfer to Toilet: with supervision;ambulating Pt Will Perform Toileting - Clothing Manipulation and hygiene: with supervision;sit to/from stand  OT Frequency: Min 1X/week           Co-evaluation PT/OT/SLP Co-Evaluation/Treatment: Yes Reason for Co-Treatment: Complexity of the patient's impairments (multi-system involvement);Necessary to address cognition/behavior during functional activity;For patient/therapist safety;To address functional/ADL transfers PT goals addressed during session: Mobility/safety with mobility;Balance;Proper use of DME;Strengthening/ROM OT goals addressed during session: ADL's and self-care;Proper use of Adaptive equipment and DME;Strengthening/ROM      AM-PAC OT "6 Clicks" Daily Activity     Outcome Measure Help from another person eating meals?: A Lot Help from another person taking care of personal grooming?: A Lot Help from another person toileting, which  includes using toliet, bedpan, or urinal?: Total Help from another person bathing (including washing, rinsing, drying)?: A Lot Help from another person to put on and taking off regular upper body clothing?: A Lot Help from another  person to put on and taking off regular lower body clothing?: Total 6 Click Score: 10   End of Session Equipment Utilized During Treatment: Rolling walker;Oxygen Nurse Communication: Mobility status  Activity Tolerance: Other (comment) (limited secondary to cognition) Patient left: in bed;with call bell/phone within reach;with bed alarm set  OT Visit Diagnosis: Unsteadiness on feet (R26.81);Muscle weakness (generalized) (M62.81);Repeated falls (R29.6)                Time: 7619-5093 OT Time Calculation (min): 26 min Charges:  OT General Charges $OT Visit: 1 Visit OT Evaluation $OT Eval Moderate Complexity: [address] [place], OTR/L , CBIS ascom [phone]  12/11/20, 1:04 PM

## 2020-12-11 NOTE — ED Notes (Signed)
Fed pt applesauce. Placed meds in applesauce d/t pt being confused. Unsure how pt takes meds at home.

## 2020-12-11 NOTE — ED Notes (Signed)
Resumed care from [place] rn.  Pt awake and talking.  nsr on monitor.  Pt wearing mitts .  High flow oxygen in place at 6 liters.  siderails up x 2.  Pt waiting on admission

## 2020-12-11 NOTE — ED Notes (Signed)
Visitor at bedside.

## 2020-12-11 NOTE — ED Notes (Signed)
Report off to jennifer rn 

## 2020-12-11 NOTE — TOC Progression Note (Signed)
Transition of Care [facility]) - Progression Note    Patient Details  Name: Manuel Reynolds MRN: [id number] Date of Birth: 1930/09/12  Transition of Care [facility]) CM/SW [place], [place] Phone Number: [phone] 12/11/2020, 4:16 PM  Clinical Narrative:     CSW called and voicemail for [place] (Spouse) [phone] (Mobile) for update on patient status.      Expected Discharge Plan and Services                                                 Social Determinants of Health (SDOH) Interventions    Readmission Risk Interventions No flowsheet data found.

## 2020-12-11 NOTE — Progress Notes (Signed)
[facility]    SUBJECTIVE: Manuel Reynolds is an 87 year old male with a past medical history significant for Alzheimer's dementia, lymphoma, hypothyroidism, chronic kidney disease, and hypertension who presented to the ED on 12/09/20 for acute shortness of breath.  Workup was significant for a chest xray revealing pulmonary edema and bilateral pleural effusions with patchy opacities, with underlying infection difficult to exclude, ECG revealing sinus rhythm with ectopy but no evidence of acute ischemia, WBC of 14, BNP of 2103, high sensitivity troponin 46, 78 respectively, and COVID-19 negative.   12/11/20: Due to underlying dementia, Manuel Reynolds is a poor historian but appears to be in no acute distress.  Currently working with physical therapy, standing with assistance.  Appears to deny chest pain or shortness of breath.    Vitals:   12/11/20 0730 12/11/20 0800 12/11/20 0830 12/11/20 0855  BP:    129/75  Pulse: 85 88 89 90  Resp: (!) 25 (!) 25 (!) 24 (!) 21  Temp:    98.4 F (36.9 C)  TempSrc:    Oral  SpO2: 100% 100% 100% 100%  Weight:      Height:         Intake/Output Summary (Last 24 hours) at 12/11/2020 7846 Last data filed at 12/10/2020 2146 Gross per 24 hour  Intake 211.46 ml  Output -  Net 211.46 ml      PHYSICAL EXAM  General: Well developed, well nourished, in no acute distress HEENT:  Normocephalic and atramatic Neck:  No JVD.  Lungs: On supplemental O2. Clear bilaterally to auscultation and percussion. Heart: HRRR . Normal S1 and S2 without gallops or murmurs.  Abdomen: Bowel sounds are positive, abdomen soft and non-tender  Msk:  Back normal. Normal strength and tone for age. Extremities: No clubbing, cyanosis or edema.   Neuro: Confused Psych:  Confused   LABS: Basic Metabolic Panel: Recent Labs    12/09/20 2207 12/10/20 0739 12/11/20 0516  NA 143  --  144  K 4.2  --  3.2*  CL 109  --  104  CO2 23  --  25  GLUCOSE 217*  --  102*  BUN 31*  --  32*   CREATININE 1.15 0.99 1.16  CALCIUM 8.7*  --  9.0   Liver Function Tests: No results for input(s): AST, ALT, ALKPHOS, BILITOT, PROT, ALBUMIN in the last 72 hours. No results for input(s): LIPASE, AMYLASE in the last 72 hours. CBC: Recent Labs    12/09/20 2207 12/10/20 0739  WBC 14.0* 6.0  NEUTROABS 7.2  --   HGB 14.7 14.5  HCT 45.8 43.0  MCV 88.9 86.9  PLT 277 245   Cardiac Enzymes: No results for input(s): CKTOTAL, CKMB, CKMBINDEX, TROPONINI in the last 72 hours. BNP: Invalid input(s): POCBNP D-Dimer: No results for input(s): DDIMER in the last 72 hours. Hemoglobin A1C: No results for input(s): HGBA1C in the last 72 hours. Fasting Lipid Panel: No results for input(s): CHOL, HDL, LDLCALC, TRIG, CHOLHDL, LDLDIRECT in the last 72 hours. Thyroid Function Tests: Recent Labs    12/10/20 0739  TSH 3.720   Anemia Panel: No results for input(s): VITAMINB12, FOLATE, FERRITIN, TIBC, IRON, RETICCTPCT in the last 72 hours.  DG Chest Portable 1 View  Result Date: 12/09/2020 CLINICAL DATA:  Quit dyspnea, hypoxic, question fluid overload EXAM: PORTABLE CHEST 1 VIEW COMPARISON:  None. FINDINGS: Areas of mixed hazy interstitial and patchy opacity are present in both lungs with a perihilar and basilar predominance. Some additional areas  of more coalescent opacity are seen in the medial lung bases with bandlike opacities in both lower lobes, possibly scarring or subsegmental atelectatic change. The pulmonary vascularity is indistinct. Small bilateral effusions. No pneumothoraces. The cardiac silhouette is likely enlarged, possibly cardiomegaly or pericardial effusion. The aorta is calcified. The remaining cardiomediastinal contours are unremarkable. No acute osseous or soft tissue abnormality. Degenerative changes are present in the imaged spine and shoulders. Telemetry leads and external support devices overlie the chest. IMPRESSION: 1. Features suggestive of CHF/volume overload with pulmonary  edema and bilateral effusions. 2. More patchy and coalescent opacities could reflect developing alveolar edema though underlying infection is difficult to exclude. 3. Enlarged cardiac silhouette, possibly cardiomegaly or pericardial effusion. Electronically Signed   By: Lovena Le M.D.   On: 12/09/2020 22:45   ECHOCARDIOGRAM COMPLETE  Result Date: 12/10/2020    ECHOCARDIOGRAM REPORT   Patient Name:   Manuel Reynolds Date of Exam: 12/10/2020 Medical Rec #:  [id number]    Height:       67.0 in Accession #:    [id number]   Weight:       165.0 lb Date of Birth:  [birth year]    BSA:          1.863 m Patient Age:    20 years     BP:           148/93 mmHg Patient Gender: M            HR:           53 bpm. Exam Location:  [facility] Procedure: 2D Echo and Cardiac Doppler Indications:     CHF-acute systolic J50.09  History:         Patient has no prior history of Echocardiogram examinations.                  Risk Factors:Hypertension.  Sonographer:     Sherrie Sport RDCS (AE) Referring Phys:  [id number] Victoriano Lain A THOMAS Diagnosing Phys: Kathlyn Sacramento MD  Sonographer Comments: Technically challenging study due to limited acoustic windows, no apical window and suboptimal parasternal window. Image acquisition challenging due to patient body habitus. IMPRESSIONS  1. Left ventricular ejection fraction, by estimation, is 50 to 55%. The left ventricle has low normal function. Left ventricular endocardial border not optimally defined to evaluate regional wall motion. There is moderate left ventricular hypertrophy. Left ventricular diastolic parameters are indeterminate.  2. Right ventricular systolic function is normal. The right ventricular size is normal.  3. Moderate pleural effusion in the left lateral region.  4. The mitral valve is normal in structure. No evidence of mitral valve regurgitation. No evidence of mitral stenosis.  5. The aortic valve is normal in structure. Aortic valve regurgitation is not visualized. Mild to moderate  aortic valve sclerosis. There is likely some degree of aortic stenosis. However, no gradient was recorded.  6. The inferior vena cava is normal in size with greater than 50% respiratory variability, suggesting right atrial pressure of 3 mmHg. FINDINGS  Left Ventricle: Left ventricular ejection fraction, by estimation, is 50 to 55%. The left ventricle has low normal function. Left ventricular endocardial border not optimally defined to evaluate regional wall motion. The left ventricular internal cavity  size was normal in size. There is moderate left ventricular hypertrophy. Left ventricular diastolic parameters are indeterminate. Right Ventricle: The right ventricular size is normal. No increase in right ventricular wall thickness. Right ventricular systolic function is normal. Left Atrium: Left atrial size was normal in  size. Right Atrium: Right atrial size was normal in size. Pericardium: There is no evidence of pericardial effusion. Mitral Valve: The mitral valve is normal in structure. No evidence of mitral valve regurgitation. No evidence of mitral valve stenosis. Tricuspid Valve: The tricuspid valve is normal in structure. Tricuspid valve regurgitation is not demonstrated. No evidence of tricuspid stenosis. Aortic Valve: The aortic valve is normal in structure. Aortic valve regurgitation is not visualized. Mild to moderate aortic valve sclerosis/calcification is present, without any evidence of aortic stenosis. Pulmonic Valve: The pulmonic valve was normal in structure. Pulmonic valve regurgitation is not visualized. No evidence of pulmonic stenosis. Aorta: The aortic root is normal in size and structure. Venous: The inferior vena cava is normal in size with greater than 50% respiratory variability, suggesting right atrial pressure of 3 mmHg. IAS/Shunts: No atrial level shunt detected by color flow Doppler. Additional Comments: There is a moderate pleural effusion in the left lateral region.  LEFT VENTRICLE  PLAX 2D LVIDd:         3.10 cm LVIDs:         2.80 cm LV PW:         1.50 cm LV IVS:        1.80 cm LVOT diam:     2.00 cm LVOT Area:     3.14 cm  LEFT ATRIUM         Index LA diam:    3.60 cm 1.93 cm/m                        PULMONIC VALVE AORTA                 PV Vmax:        0.54 m/s Ao Root diam: 3.20 cm PV Peak grad:   1.1 mmHg                       RVOT Peak grad: 2 mmHg   SHUNTS Systemic Diam: 2.00 cm Kathlyn Sacramento MD Electronically signed by Kathlyn Sacramento MD Signature Date/Time: 12/10/2020/1:37:06 PM    Final      Echo:  Normal RV systolic function and low normal LV systolic function with an EF estimated between 50-55% with moderate LVH, moderate left pleural effusion, mild to moderate aortic valve sclerosis with likely some degree of AS.   TELEMETRY: Sinus rhythm   ASSESSMENT AND PLAN:  Active Problems:   CHF (congestive heart failure) ([place])    1.  Acute CHF   -Pulmonary edema on chest xray, BNP of 2100  -Continue Lasix injection 60mg  BID with supplemental potassium   -Continue I's and O's, daily weights   -Echocardiogram this admission revealing preserved LV systolic function   -Remains on supplemental O2   2.  Elevated troponin   -Borderline elevated, likely demand ischemia in the setting of new onset CHF; no further invasive cardiac workup indicated at this time    3.  Hypertension   -Normotensive this morning; continue losartan 50mg  and Lasix injections   4.  Hyperlipidemia   -Continue simvastatin 40mg  daily   The history, physical exam findings, and plan of care were all discussed with Dr. Bartholome Bill, and all decision making was made in collaboration.   Avie Arenas  PA-C 12/11/2020 9:22 AM

## 2020-12-11 NOTE — Evaluation (Signed)
Physical Therapy Evaluation Patient Details Name: Manuel Reynolds MRN: [id number] DOB: 1930-09-20 Today's Date: 12/11/2020   History of Present Illness  Patient is an 87 year old male whose wife reports patient c/o of shortness of breath while eating dinner, could hear crackling noises coming from his chest. She called EMS. EMS noted 40% on RA, complicated by agitation with patient pulling off bipap. Arrived to ED on NRB 15L/min sat 95% PMh includes dementia, alzheimers, anxiety, hypothyroidism, lymphoma in remission, BLE edema on prm lasix, CKD stage III.    Clinical Impression  Patient is a very confused 87 year old male who presents with diffuse weakness and limited mobility. Prior to hospital admission, pt was ambulatory with RW and/or cane but required assistance for bathing and dressing and lives with his wife at [facility] .  Currently pt is limited with all mobility requiring a co-treat with OT due to excessive confusion and x2 assistance for all mobility required.  He is unable to provide history, requiring history to be obtained from previous documentation from wife. He inconsistently follows simple commands and shows little to no safety awareness. Heavy cueing and assistance required from PT and OT for transition to EOB, and from sit to stand. Upon standing patients vitals mis-reading with frequent PVC's reading.  Patient requires x2 assistance to return to bed and reposition in bed with needs met. Pt would benefit from skilled PT to address noted impairments and functional limitations (see below for any additional details).  Upon hospital discharge, pt would benefit from SNF placement due to high fall risk and limited mobility.     Follow Up Recommendations SNF    Equipment Recommendations  None recommended by PT (if going to SNF)    Recommendations for Other Services       Precautions / Restrictions Precautions Precautions: Fall Restrictions Weight Bearing  Restrictions: No      Mobility  Bed Mobility Overal bed mobility: Needs Assistance Bed Mobility: Supine to Sit;Sit to Supine     Supine to sit: +2 for physical assistance;Max assist;HOB elevated;Mod assist Sit to supine: +2 for physical assistance;Max assist;HOB elevated   General bed mobility comments: Patient is confused and requires x2 assistance for transfer supine/sit.    Transfers Overall transfer level: Needs assistance Equipment used: Rolling walker (2 wheeled) Transfers: Sit to/from Stand Sit to Stand: Mod assist;Max assist;+2 physical assistance;+2 safety/equipment;From elevated surface         General transfer comment: Patient is confused, requires x2 assistance for mobility due to posterior trunk lean, excessive weight shift onto bilateral heels.  Ambulation/Gait Ambulation/Gait assistance: Max assist;+2 physical assistance;+2 safety/equipment   Assistive device: Rolling walker (2 wheeled)       General Gait Details: able to perform two lateral steps at bedside with x2 assistance and excessive weight shift onto heels, very unsafe6  Stairs            Wheelchair Mobility    Modified Rankin (Stroke Patients Only)       Balance Overall balance assessment: Needs assistance Sitting-balance support: Bilateral upper extremity supported Sitting balance-Leahy Scale: Poor Sitting balance - Comments: patient requires support in seated position due to posterior and lateral trunk lean. Upon attempt at kicking LE posterior trunk lean. Postural control: Posterior lean Standing balance support: Bilateral upper extremity supported;During functional activity Standing balance-Leahy Scale: Poor Standing balance comment: Patient requires heavy BUE support and stabilization x2 person assist.  Pertinent Vitals/Pain Pain Assessment: Faces Faces Pain Scale: No hurt    Home Living Family/patient expects to be discharged to::  Assisted living               Home Equipment: Walker - 2 wheels;Cane - single point Additional Comments: Patient lives with wife at [place]. Patient unable to provide history, information taken from patient's chart as wife is not present.    Prior Function Level of Independence: Needs assistance   Gait / Transfers Assistance Needed: Per previous documentation patient is ambulatory with walker and/or cane within home.  ADL's / Homemaking Assistance Needed: Per previous documentation patient is able to feed himself but requires help from spouse for bathing, dressing, and med organization  Comments: Patient unable to provide history, requiring history to be obtained from previous documentation.     Hand Dominance        Extremity/Trunk Assessment   Upper Extremity Assessment Upper Extremity Assessment: Defer to OT evaluation    Lower Extremity Assessment Lower Extremity Assessment: Difficult to assess due to impaired cognition;Generalized weakness (Patient limited in command follow, able to stand with assistance indicating 3/5 gross strength.)       Communication   Communication: HOH;Other (comment) (confusion)  Cognition Arousal/Alertness: Awake/alert Behavior During Therapy: Impulsive;Restless Overall Cognitive Status: No family/caregiver present to determine baseline cognitive functioning                                 General Comments: Patient confused, inconsistant command follow, no apparent safety awareness.      General Comments General comments (skin integrity, edema, etc.): patient appears well nourished, disheveled.    Exercises Other Exercises Other Exercises: Patient educated on role of PT in acute care setting, safe mobility and tranfers, seated and standing balance.   Assessment/Plan    PT Assessment Patient needs continued PT services  PT Problem List Decreased strength;Decreased activity  tolerance;Decreased balance;Decreased mobility;Decreased coordination;Decreased safety awareness;Decreased cognition       PT Treatment Interventions DME instruction;Gait training;Functional mobility training;Therapeutic activities;Therapeutic exercise;Balance training;Neuromuscular re-education;Cognitive remediation;Patient/family education;Wheelchair mobility training    PT Goals (Current goals can be found in the Care Plan section)  Acute Rehab PT Goals Patient Stated Goal: patient unable PT Goal Formulation: Patient unable to participate in goal setting    Frequency Min 2X/week   Barriers to discharge Decreased caregiver support Patient unsafe for home at this time.    Co-evaluation PT/OT/SLP Co-Evaluation/Treatment: Yes Reason for Co-Treatment: Complexity of the patient's impairments (multi-system involvement);Necessary to address cognition/behavior during functional activity;For patient/therapist safety;To address functional/ADL transfers PT goals addressed during session: Mobility/safety with mobility;Balance;Proper use of DME;Strengthening/ROM OT goals addressed during session: ADL's and self-care;Proper use of Adaptive equipment and DME;Strengthening/ROM       AM-PAC PT "6 Clicks" Mobility  Outcome Measure Help needed turning from your back to your side while in a flat bed without using bedrails?: A Little Help needed moving from lying on your back to sitting on the side of a flat bed without using bedrails?: A Lot Help needed moving to and from a bed to a chair (including a wheelchair)?: A Lot Help needed standing up from a chair using your arms (e.g., wheelchair or bedside chair)?: A Lot Help needed to walk in hospital room?: A Lot Help needed climbing 3-5 steps with a railing? : Total 6 Click Score: 12    End of Session Equipment Utilized During  Treatment: Gait belt;Oxygen (6L via nasal cannula) Activity Tolerance: Patient limited by fatigue;Other (comment)  (confusion) Patient left: in bed;with call bell/phone within reach Nurse Communication: Mobility status PT Visit Diagnosis: Unsteadiness on feet (R26.81);Other abnormalities of gait and mobility (R26.89);Muscle weakness (generalized) (M62.81);Difficulty in walking, not elsewhere classified (R26.2)    Time: 0041-5930 PT Time Calculation (min) (ACUTE ONLY): 26 min   Charges:   PT Evaluation $PT Eval Moderate Complexity: 1 Mod         Janna Arch, PT, DPT   12/11/2020, 11:05 AM

## 2020-12-11 NOTE — ED Notes (Signed)
Attempted to feed pt. Pt refused. Pt ate apple sauce with medicine. Pt is resting now.

## 2020-12-12 DIAGNOSIS — J810 Acute pulmonary edema: Secondary | ICD-10-CM | POA: Diagnosis present

## 2020-12-12 LAB — BASIC METABOLIC PANEL
Anion gap: 13 (ref 5–15)
BUN: 34 mg/dL — ABNORMAL HIGH (ref 8–23)
CO2: 27 mmol/L (ref 22–32)
Calcium: 8.9 mg/dL (ref 8.9–10.3)
Chloride: 108 mmol/L (ref 98–111)
Creatinine, Ser: 1.34 mg/dL — ABNORMAL HIGH (ref 0.61–1.24)
GFR, Estimated: 51 mL/min — ABNORMAL LOW (ref >60)
Glucose, Bld: 112 mg/dL — ABNORMAL HIGH (ref 70–99)
Potassium: 3.2 mmol/L — ABNORMAL LOW (ref 3.5–5.1)
Sodium: 148 mmol/L — ABNORMAL HIGH (ref 135–145)

## 2020-12-12 MED ORDER — POTASSIUM CHLORIDE CRYS ER 20 MEQ PO TBCR
40.0000 meq | EXTENDED_RELEASE_TABLET | Freq: Once | ORAL | Status: AC
  Administered 2020-12-12: 40 meq via ORAL
  Filled 2020-12-12: qty 2

## 2020-12-12 MED ORDER — ACEBUTOLOL HCL 200 MG PO CAPS
200.0000 mg | ORAL_CAPSULE | Freq: Every day | ORAL | Status: DC
  Filled 2020-12-12 (×3): qty 1

## 2020-12-12 NOTE — ED Notes (Signed)
Pt changed, new gown and brief placed on patient. Patient repositioned in bed.

## 2020-12-12 NOTE — Progress Notes (Signed)
[facility] PROGRESS NOTE    Kenner Agro  [id number] DOB: [birth year] DOA: 12/09/2020 PCP: [name], DO      Brief Narrative:  87 y.o.malewith medical history significant of Dementia Alzheimer's, anxiety, hypothyroidism,Lymphoma in remission,Hypertension,b/l lower extremity edema on prn lasix., CKDIII who presents to ed BIB EMS on NRB 15L/min sat 95%.Of note patient is a poor historian and history is taken from chart. Per chart patient has acute shortness of breath that started immediately prior to presentation. Per wife patient complained of increase sob while eating dinner. She also noted per ED MD that she heard crackling noises coming from his chest. Due to continued symptoms x 1 hour she called EMS.  In the field per noted patient sat on arrival of EMS was 60% on ra.Of note ed coarse complicated by episode of agitation, with patient pulling of bipap he was treated with one dose of droperidol 2.5mg  iv.        Assessment & Plan:  New onset CHF Acute diastolic CHF Likely flash pulmonary edema Left pleural effusion Weaned off BiPAP Cr up Only 800cc out. -Hold Lasix -  Order US thoracentesis  -Cardiology continues to follow    Hypertensive emergency Acute severe HTN with acute flash pulmonary edema BP normal -Hold losartan with AKI -Continue acetbulolol   Dementia Alzheimer's -late stage per last pcp note   Acute metabolic encephalopathy Off Precedex -Delirium precautions  hypothyroidism -continue synthroid  CKDIII         Disposition: Status is: Inpatient  Remains inpatient appropriate because:Altered mental status   Dispo: The patient is from: Home              Anticipated d/c is to: SNF              Anticipated d/c date is: 2 days              Patient currently is not medically stable to d/c.              MDM: The below labs and imaging reports were reviewed and summarized above.   Medication management as above.   DVT prophylaxis: enoxaparin (LOVENOX) injection 40 mg Start: 12/10/20 1000  Code Status: DNR Family Communication:     Consultants:     Procedures:     Antimicrobials:      Culture data:              Subjective: Patient still confused.  No fever. No cough, no sputum, no respiratory distress, no vomiting, no diarrhea, no pain complaints.  Objective: Vitals:   12/12/20 1200 12/12/20 1252 12/12/20 1400 12/12/20 1630  BP: 139/89 129/83 (!) 124/91 (!) 148/88  Pulse: 93 97 98 98  Resp: (!) 24 (!) 22 (!) 22 17  Temp:      TempSrc:      SpO2: 91% 100%  95%  Weight:      Height:        Intake/Output Summary (Last 24 hours) at 12/12/2020 1726 Last data filed at 12/12/2020 0301 Gross per 24 hour  Intake -  Output 600 ml  Net -600 ml   Filed Weights   12/09/20 2159  Weight: 74.8 kg    Examination: General appearance: thin elderly adult male, awake but confused, no obvious distress.   HEENT: Anicteric, conjunctiva pink, lids and lashes normal. No nasal deformity, discharge, epistaxis.  Lips dry, OP dry, no oral lesions, hearing normal.   Skin: Warm and dry.  no jaundice.  No suspicious rashes or lesions. Cardiac: RRR, nl S1-S2, no murmurs appreciated.  Capillary refill is brisk.  JVP normal.  No LE edema.  Radial  pulses 2+ and symmetric. Respiratory: Normal respiratory rate and rhythm.  CTAB without rales or wheezes. Abdomen: Abdomen soft.  No TTP. No ascites, distension, hepatosplenomegaly.   MSK: No deformities or effusions. Neuro: Awake and confused.  EOMI, moves all extremities with generalized weakness. Speech fluent.    Psych: Sensorium intact and responding to questions, but not oriented to person, place or situation, mumbles incoherently.  Attention dminished, judgment impaired     Data Reviewed: I have personally reviewed following labs and imaging studies:  CBC: Recent Labs  Lab 12/09/20 2207 12/10/20 0739   WBC 14.0* 6.0  NEUTROABS 7.2  --   HGB 14.7 14.5  HCT 45.8 43.0  MCV 88.9 86.9  PLT 277 035   Basic Metabolic Panel: Recent Labs  Lab 12/09/20 2207 12/10/20 0739 12/11/20 0516 12/12/20 0528  NA 143  --  144 148*  K 4.2  --  3.2* 3.2*  CL 109  --  104 108  CO2 23  --  25 27  GLUCOSE 217*  --  102* 112*  BUN 31*  --  32* 34*  CREATININE 1.15 0.99 1.16 1.34*  CALCIUM 8.7*  --  9.0 8.9   GFR: Estimated Creatinine Clearance: 35.6 mL/min (A) (by C-G formula based on SCr of 1.34 mg/dL (H)). Liver Function Tests: No results for input(s): AST, ALT, ALKPHOS, BILITOT, PROT, ALBUMIN in the last 168 hours. No results for input(s): LIPASE, AMYLASE in the last 168 hours. No results for input(s): AMMONIA in the last 168 hours. Coagulation Profile: Recent Labs  Lab 12/09/20 2207  INR 1.1   Cardiac Enzymes: No results for input(s): CKTOTAL, CKMB, CKMBINDEX, TROPONINI in the last 168 hours. BNP (last 3 results) No results for input(s): PROBNP in the last 8760 hours. HbA1C: No results for input(s): HGBA1C in the last 72 hours. CBG: No results for input(s): GLUCAP in the last 168 hours. Lipid Profile: No results for input(s): CHOL, HDL, LDLCALC, TRIG, CHOLHDL, LDLDIRECT in the last 72 hours. Thyroid Function Tests: Recent Labs    12/10/20 0739  TSH 3.720   Anemia Panel: No results for input(s): VITAMINB12, FOLATE, FERRITIN, TIBC, IRON, RETICCTPCT in the last 72 hours. Urine analysis:    Component Value Date/Time   BILIRUBINUR Negative 11/30/2019 1345   PROTEINUR Negative 11/30/2019 1345   UROBILINOGEN 0.2 11/30/2019 1345   NITRITE Negative 11/30/2019 1345   LEUKOCYTESUR Negative 11/30/2019 1345   Sepsis Labs: @LABRCNTIP (procalcitonin:4,lacticacidven:4)  ) Recent Results (from the past 240 hour(s))  Resp Panel by RT-PCR (Flu A&B, Covid) Nasopharyngeal Swab     Status: None   Collection Time: 12/09/20 10:18 PM   Specimen: Nasopharyngeal Swab; Nasopharyngeal(NP) swabs  in vial transport medium  Result Value Ref Range Status   SARS Coronavirus 2 by RT PCR NEGATIVE NEGATIVE Final    Comment: (NOTE) SARS-CoV-2 target nucleic acids are NOT DETECTED.  The SARS-CoV-2 RNA is generally detectable in upper respiratory specimens during the acute phase of infection. The lowest concentration of SARS-CoV-2 viral copies this assay can detect is 138 copies/mL. A negative result does not preclude SARS-Cov-2 infection and should not be used as the sole basis for treatment or other patient management decisions. A negative result may occur with  improper specimen collection/handling, submission of specimen other than nasopharyngeal swab, presence of viral mutation(s) within the areas targeted by this assay,  and inadequate number of viral copies(<138 copies/mL). A negative result must be combined with clinical observations, patient history, and epidemiological information. The expected result is Negative.  Fact Sheet for Patients:  EntrepreneurPulse.com.au  Fact Sheet for Healthcare Providers:  IncredibleEmployment.be  This test is no t yet approved or cleared by the [place] FDA and  has been authorized for detection and/or diagnosis of SARS-CoV-2 by FDA under an Emergency Use Authorization (EUA). This EUA will remain  in effect (meaning this test can be used) for the duration of the COVID-19 declaration under Section 564(b)(1) of the Act, 21 U.S.C.section 360bbb-3(b)(1), unless the authorization is terminated  or revoked sooner.       Influenza A by PCR NEGATIVE NEGATIVE Final   Influenza B by PCR NEGATIVE NEGATIVE Final    Comment: (NOTE) The Xpert Xpress SARS-CoV-2/FLU/RSV plus assay is intended as an aid in the diagnosis of influenza from Nasopharyngeal swab specimens and should not be used as a sole basis for treatment. Nasal washings and aspirates are unacceptable for Xpert Xpress  SARS-CoV-2/FLU/RSV testing.  Fact Sheet for Patients: EntrepreneurPulse.com.au  Fact Sheet for Healthcare Providers: IncredibleEmployment.be  This test is not yet approved or cleared by the [place] FDA and has been authorized for detection and/or diagnosis of SARS-CoV-2 by FDA under an Emergency Use Authorization (EUA). This EUA will remain in effect (meaning this test can be used) for the duration of the COVID-19 declaration under Section 564(b)(1) of the Act, 21 U.S.C. section 360bbb-3(b)(1), unless the authorization is terminated or revoked.  Performed at [facility], [address] [place], [place] [address]   Culture, blood (Routine X 2) w Reflex to ID Panel     Status: None (Preliminary result)   Collection Time: 12/09/20 10:53 PM   Specimen: BLOOD  Result Value Ref Range Status   Specimen Description BLOOD BLOOD LEFT FOREARM  Final   Special Requests   Final    BOTTLES DRAWN AEROBIC AND ANAEROBIC Blood Culture results may not be optimal due to an inadequate volume of blood received in culture bottles   Culture   Final    NO GROWTH 3 DAYS Performed at [facility], [address], [place], [place] [address]    Report Status PENDING  Incomplete  Culture, blood (Routine X 2) w Reflex to ID Panel     Status: None (Preliminary result)   Collection Time: 12/09/20 10:54 PM   Specimen: BLOOD  Result Value Ref Range Status   Specimen Description BLOOD BLOOD LEFT FOREARM  Final   Special Requests   Final    BOTTLES DRAWN AEROBIC AND ANAEROBIC Blood Culture results may not be optimal due to an excessive volume of blood received in culture bottles   Culture   Final    NO GROWTH 3 DAYS Performed at [facility], [address] [place], [place] [address]    Report Status PENDING  Incomplete         Radiology Studies: No results found.      Scheduled Meds: . acebutolol  200 mg Oral Daily  .  aspirin EC  81 mg Oral Daily  . enoxaparin (LOVENOX) injection  40 mg Subcutaneous Q24H  . levothyroxine  50 mcg Oral Q0600  . simvastatin  40 mg Oral q1800  . sodium chloride flush  3 mL Intravenous Q12H   Continuous Infusions: . sodium chloride       LOS: 2 days    Time spent: 25 minutes  Edwin Dada, MD [facility] 12/12/2020, 5:26 PM     Please page though [place] or Epic secure chat:  For [employer], Adult nurse

## 2020-12-12 NOTE — ED Notes (Signed)
Randol Kern, NP notified via secure chat that pts potassium of 3.2 and Creatinine is 1.34. Morrsion, NP also notified that this pt is no longer requiring oxygen and is on room air. Pt in NAD at this time.  VSS.  Awaiting further orders. Will continue to monitor.

## 2020-12-12 NOTE — ED Notes (Signed)
Pt's wife updated about patient.

## 2020-12-12 NOTE — ED Notes (Signed)
Pt removed condom cath and was incontinent of urine. Pt cleaned, sheets changed and peri care provided. Mepilex placed on pts sacrum for skin protection. Pt turned onto left side. No other changes in pt condition. VSS. Awaiting further orders. Will continue to monitor.

## 2020-12-13 ENCOUNTER — Inpatient Hospital Stay: Payer: Medicare Other

## 2020-12-13 ENCOUNTER — Ambulatory Visit: Payer: Medicare Other | Admitting: Podiatry

## 2020-12-13 LAB — COMPREHENSIVE METABOLIC PANEL
ALT: 42 U/L (ref 0–44)
AST: 60 U/L — ABNORMAL HIGH (ref 15–41)
Albumin: 3.2 g/dL — ABNORMAL LOW (ref 3.5–5.0)
Alkaline Phosphatase: 74 U/L (ref 38–126)
Anion gap: 14 (ref 5–15)
BUN: 46 mg/dL — ABNORMAL HIGH (ref 8–23)
CO2: 27 mmol/L (ref 22–32)
Calcium: 9 mg/dL (ref 8.9–10.3)
Chloride: 110 mmol/L (ref 98–111)
Creatinine, Ser: 1.51 mg/dL — ABNORMAL HIGH (ref 0.61–1.24)
GFR, Estimated: 44 mL/min — ABNORMAL LOW (ref >60)
Glucose, Bld: 105 mg/dL — ABNORMAL HIGH (ref 70–99)
Potassium: 3.1 mmol/L — ABNORMAL LOW (ref 3.5–5.1)
Sodium: 151 mmol/L — ABNORMAL HIGH (ref 135–145)
Total Bilirubin: 1.9 mg/dL — ABNORMAL HIGH (ref 0.3–1.2)
Total Protein: 6.2 g/dL — ABNORMAL LOW (ref 6.5–8.1)

## 2020-12-13 LAB — MAGNESIUM: Magnesium: 2.3 mg/dL (ref 1.7–2.4)

## 2020-12-13 LAB — LACTATE DEHYDROGENASE: LDH: 252 U/L — ABNORMAL HIGH (ref 98–192)

## 2020-12-13 MED ORDER — POTASSIUM CHLORIDE CRYS ER 20 MEQ PO TBCR: 20.0000 meq | EXTENDED_RELEASE_TABLET | Freq: Once | ORAL | Status: DC

## 2020-12-13 MED ORDER — POTASSIUM CL IN DEXTROSE 5% 20 MEQ/L IV SOLN
20.0000 meq | INTRAVENOUS | Status: AC
  Administered 2020-12-13: 20 meq via INTRAVENOUS
  Filled 2020-12-13 (×2): qty 1000

## 2020-12-13 NOTE — Progress Notes (Signed)
Physical Therapy Treatment Patient Details Name: Manuel Reynolds MRN: [id number] DOB: 04/15/1930 Today's Date: 12/13/2020    History of Present Illness Patient is an 87 year old male whose wife reports patient c/o of shortness of breath while eating dinner, could hear crackling noises coming from his chest. She called EMS. EMS noted 93% on RA, complicated by agitation with patient pulling off bipap. Arrived to ED on NRB 15L/min sat 95% PMh includes dementia, alzheimers, anxiety, hypothyroidism, lymphoma in remission, BLE edema on prm lasix, CKD stage III.    PT Comments    Pt out for testing this am and recently returned.  Attempted session but pt remained asleep and did not awaken for session.  Will continue at a later time and date.  Follow Up Recommendations  SNF     Equipment Recommendations  None recommended by PT (if going to SNF)    Recommendations for Other Services       Precautions / Restrictions Precautions Precautions: Fall Restrictions Weight Bearing Restrictions: No    Mobility  Bed Mobility               General bed mobility comments: asleep during session and did not awaken  Transfers                    Ambulation/Gait                 Stairs             Wheelchair Mobility    Modified Rankin (Stroke Patients Only)       Balance                                            Cognition Arousal/Alertness: Lethargic Behavior During Therapy: WFL for tasks assessed/performed Overall Cognitive Status: No family/caregiver present to determine baseline cognitive functioning                                        Exercises      General Comments        Pertinent Vitals/Pain Pain Assessment: No/denies pain    Home Living                      Prior Function            PT Goals (current goals can now be found in the care plan section) Progress towards PT goals: Progressing  toward goals    Frequency    Min 2X/week      PT Plan      Co-evaluation              AM-PAC PT "6 Clicks" Mobility   Outcome Measure  Help needed turning from your back to your side while in a flat bed without using bedrails?: Total Help needed moving from lying on your back to sitting on the side of a flat bed without using bedrails?: Total Help needed moving to and from a bed to a chair (including a wheelchair)?: Total Help needed standing up from a chair using your arms (e.g., wheelchair or bedside chair)?: Total Help needed to walk in hospital room?: Total Help needed climbing 3-5 steps with a railing? : Total 6 Click Score: 6    End of  Session Equipment Utilized During Treatment: Gait belt;Oxygen (6L via nasal cannula) Activity Tolerance: Patient limited by fatigue;Other (comment) (confusion) Patient left: in bed;with call bell/phone within reach Nurse Communication: Mobility status PT Visit Diagnosis: Unsteadiness on feet (R26.81);Other abnormalities of gait and mobility (R26.89);Muscle weakness (generalized) (M62.81);Difficulty in walking, not elsewhere classified (R26.2)     Time: 6433-2951 PT Time Calculation (min) (ACUTE ONLY): 10 min  Charges:  $Therapeutic Exercise: 8-22 mins                    Chesley Noon, PTA 12/13/20, 11:30 AM

## 2020-12-13 NOTE — Progress Notes (Signed)
RE: Manuel Reynolds Date of Birth: October 21, 2030 Date: 12/13/20   To Whom It May Concern:  Please be advised that the above-named patient will require a short-term nursing home stay - anticipated 30 days or less for rehabilitation and strengthening.  The plan is for return home.

## 2020-12-13 NOTE — Plan of Care (Signed)
Pt alert and disoriented, unable to follow commands or make needs known. High BP and PRN Apresoline was given IV with effect. PRN tylenol given for pain. Falls precautions remained in place. Room near nursing station. Call bell within reach  12/12/20 2156  Vitals  Temp 99.2 F (37.3 C)  Temp Source Oral  BP (!) 123/102  MAP (mmHg) 109  BP Location Left Arm  BP Method Automatic  Patient Position (if appropriate) Lying  Pulse Rate 100  Resp (!) 26  MEWS COLOR  MEWS Score Color Yellow  Oxygen Therapy  SpO2 100 %  O2 Device Room Air  MEWS Score  MEWS Temp 0  MEWS Systolic 0  MEWS Pulse 0  MEWS RR 2  MEWS LOC 0  MEWS Score 2   Problem: Coping: Goal: Level of anxiety will decrease Outcome: Progressing   Problem: Pain Managment: Goal: General experience of comfort will improve Outcome: Progressing   Problem: Safety: Goal: Ability to remain free from injury will improve Outcome: Progressing

## 2020-12-13 NOTE — Progress Notes (Signed)
Mobility Specialist - Progress Note   12/13/20 1523  Mobility  Activity Contraindicated/medical hold  Mobility performed by Mobility specialist    Per nursing, pt has been asleep all day. Attempted to wake pt. Pt remained sound asleep. Nursing aware. Will hold off and re-attempt when pt is appropriate.    Czarina Gingras Mobility Specialist  12/13/20, 3:24 PM

## 2020-12-13 NOTE — TOC Progression Note (Signed)
Transition of Care [facility]) - Progression Note    Patient Details  Name: Manuel Reynolds MRN: [id number] Date of Birth: 06-11-1930  Transition of Care [facility]) CM/SW [place], [place] Phone Number: [phone] 12/13/2020, 9:03 AM  Clinical Narrative:     CSW spoke with patient's Hollman,[name] (Spouse) [phone] (Mobile) w/ update on patient status. Ms. Oregel stated the patient would have to go to a SNF because she is unable to care for him if he cannot safely ambulate.  Ms. Leighty stated she did not know which SNF was best for he patient but would like [facility] in the list because it is walking distance from their home.  CSW explained SNF placement process and estimated time line.  CSW gave Ms. Sinning medicare.gov information.     Expected Discharge Plan and Services                                                 Social Determinants of Health (SDOH) Interventions    Readmission Risk Interventions No flowsheet data found.

## 2020-12-13 NOTE — TOC Progression Note (Addendum)
Transition of Care [facility]) - Progression Note    Patient Details  Name: Domnick Chervenak MRN: [id number] Date of Birth: 10-31-1930  Transition of Care [facility]) CM/SW [place], LCSW Phone Number: 12/13/2020, 10:28 AM  Clinical Narrative:   Started SNF work up. Per MD patient should be ready to DC to SNF tomorrow. Reached out to Neoma Laming at [facility] to see if they would be able to accept patient tomorrow, waiting for response.  1:10- Call from patient's wife, she says now [facility] is her 1st choice, [facility] 2nd choice. Reached out to [facility] to see if they can accept patient. Waiting for responses from both facilities.  2:40- Patient's wife requested update. CSW attempted calls to [facility] x 3. No answer. Informed her that [facility] accepted patient, and CSW waiting to hear from [facility]. She verbalized understanding. She reported if [facility] is unable to accept patient, she would like [facility].   3:10- [facility] made a bed offer, pending bed availability. Updated patient's wife, she verbalized understanding that patient will have to go to SNF that has a bed available when he is medically ready for DC.       Expected Discharge Plan and Services                                                 Social Determinants of Health (SDOH) Interventions    Readmission Risk Interventions No flowsheet data found.

## 2020-12-13 NOTE — Progress Notes (Signed)
Occupational Therapy Treatment Patient Details Name: Manuel Reynolds MRN: [id number] DOB: Oct 12, 1930 Today's Date: 12/13/2020    History of present illness Patient is an 87 year old male whose wife reports patient c/o of shortness of breath while eating dinner, could hear crackling noises coming from his chest. She called EMS. EMS noted 41% on RA, complicated by agitation with patient pulling off bipap. Arrived to ED on NRB 15L/min sat 95% PMh includes dementia, alzheimers, anxiety, hypothyroidism, lymphoma in remission, BLE edema on prm lasix, CKD stage III.   OT comments  Upon entering the room, pt supine in bed and very lethargic. OT removed hand mitt during session. HOB elevated to 45 degrees for safety. Total assist to wash face to alert pt with pt not opening eyes. OT then placed lemon swabs in mouth with pt grimacing and then opening eyes. Total A for oral care as pt was unable to assist without hand over hand assistance. Pt unable to follow any verbal commands. Straw placed in mouth and pt unable to sip from it. OT then provided small sip from spoon of water with pt coughing immediately. Pt has not ate from lunch tray and RN reports pt unable to take medication by mouth. OT messaged MD and SLP order placed. Pt making limited progress this session secondary to lethargy. Hand mitt placed and bed lowered with bed alarm activated. All needs within reach and pt back asleep as therapist exits the room. OT continued to recommend SNF for discharge to address functional deficits.  Follow Up Recommendations  SNF    Equipment Recommendations  Other (comment) (defer to next venue of care)       Precautions / Restrictions Precautions Precautions: Fall              ADL either performed or assessed with clinical judgement   ADL Overall ADL's : Needs assistance/impaired Eating/Feeding: Total assistance   Grooming: Wash/dry hands;Wash/dry face;Total assistance                                  General ADL Comments: hand over hand assistance for self care secondary to decreased alertness     Vision Baseline Vision/History: Wears glasses Wears Glasses: At all times Patient Visual Report: No change from baseline            Cognition Arousal/Alertness: Lethargic Behavior During Therapy: Flat affect Overall Cognitive Status: No family/caregiver present to determine baseline cognitive functioning                                 General Comments: Pt confused. Pt does not interact with therapist and requires hand over hand assistance. He does not follow any commands.                   Pertinent Vitals/ Pain       Pain Assessment: Faces Faces Pain Scale: No hurt         Frequency  Min 1X/week        Progress Toward Goals  OT Goals(current goals can now be found in the care plan section)  Progress towards OT goals: Not progressing toward goals - comment (secondary to lethargy this session)  Acute Rehab OT Goals Patient Stated Goal: patient unable to state OT Goal Formulation: With patient Time For Goal Achievement: 12/25/20 Potential to Achieve Goals: Poor  Plan Discharge plan  remains appropriate       AM-PAC OT "6 Clicks" Daily Activity     Outcome Measure   Help from another person eating meals?: Total Help from another person taking care of personal grooming?: Total Help from another person toileting, which includes using toliet, bedpan, or urinal?: Total Help from another person bathing (including washing, rinsing, drying)?: Total Help from another person to put on and taking off regular upper body clothing?: Total Help from another person to put on and taking off regular lower body clothing?: Total 6 Click Score: 6    End of Session Equipment Utilized During Treatment: Oxygen  OT Visit Diagnosis: Unsteadiness on feet (R26.81);Muscle weakness (generalized) (M62.81);Repeated falls (R29.6)   Activity Tolerance Patient  limited by lethargy   Patient Left in bed;with call bell/phone within reach;with bed alarm set   Nurse Communication Precautions;Other (comment) (lethargy, decreased oral consumption)        Time: 1430-1455 OT Time Calculation (min): 25 min  Charges: OT General Charges $OT Visit: 1 Visit OT Treatments $Self Care/Home Management : 23-37 mins  Darleen Crocker, MS, OTR/L , CBIS ascom [phone]  12/13/20, 4:07 PM

## 2020-12-13 NOTE — Progress Notes (Signed)
Re: Manuel Reynolds Date of Birth: 10/12/2030 Date: 12/13/20   To Whom It May Concern:  Please be advised that the above-name patient's dementia diagnosis is primary and supersedes his mental illness.

## 2020-12-13 NOTE — Progress Notes (Signed)
   12/12/20 2156  Assess: MEWS Score  Temp 99.2 F (37.3 C)  BP (!) 123/102  Pulse Rate 100  Resp (!) 26  Level of Consciousness Alert  SpO2 100 %  O2 Device Room Air  Assess: if the MEWS score is Yellow or Red  Were vital signs taken at a resting state? Yes  Focused Assessment No change from prior assessment  Early Detection of Sepsis Score *See Row Information* Low  Treat  MEWS Interventions Administered prn meds/treatments;Other (Comment) (did not escalate)  Pain Scale PAINAD  Breathing 0  Negative Vocalization 0  Facial Expression 1  Body Language 1  Consolability 0  PAINAD Score 2  Take Vital Signs  Increase Vital Sign Frequency  Yellow: Q 2hr X 2 then Q 4hr X 2, if remains yellow, continue Q 4hrs  Escalate  MEWS: Escalate Yellow: discuss with charge nurse/RN and consider discussing with provider and RRT  Document  Patient Outcome Stabilized after interventions  Did not escalate will continue to monitor patient after interventions

## 2020-12-13 NOTE — Progress Notes (Signed)
[facility] PROGRESS NOTE    Manuel Reynolds  [id number] DOB: 21-Oct-1930 DOA: 12/09/2020 PCP: [name], DO      Brief Narrative:  Manuel Reynolds is an 87 y.o.M with Alzheimer's dementia, lives at home, hypothyroidism, Lymphoma in remission, HTN, CKD IIIa baseline Cr 1.1-1.3 who presented with acute onset hypoxia and dyspnea.    Patient had had leg swelling on and off for months, couldn't tolerate leg elevation or compression.  Finally, on day of admission he was at home with wife, eating supper when he suddenly asked for "an oxygen pill".  Then he slowly appeared to breath heavier and have crackling in his chest and appeared to be in distress.  On EMS arrival his O2 was 60% and in the ER, his CXR showed bilateral opacities and bilateral pleural effusions.  BP very severely elevated.  He was admitted and started on nitro, IV Lasix.            Assessment & Plan:  New onset acute diastolic CHF Likely flash pulmonary edema Patient admitted and started on IV Lasix.  Diuresed now 1.2L, but Cr up and K down. -Hold diuretics -Strict intake and output -Daily BMP   Hypernatremia Due to diuresis, poor PO intake given encephalopathy - D5W with K at 100 cc/hr - Trend BMP   Hypokalemia -Supplement K -Check Mag  AKI on CKD 3A Patient started on IV diuresis, creatinine went from 1.1 up to 1.5. - Hold diuretics - Start gentle fluids - Trend Cr   Alzheimer's dementia Acute metabolic encephalopathy At baseline, the patient is able to ambulate with a walker, respond to questions, but is frequently nonsensical in his answers.  Lives at home.  His current encephalopathy is accommodation of CHF, hyponatremia, hypokalemia, and hypertensive.  Do not suspect infection or stroke.  -Delirium precautions    Left pleural effusion Chest x-ray yesterday showed persistent pleural effusion.  Ultrasound shows insufficient fluid for sampling.  Given the  circumstances, highly suspect this is transudative from CHF.  This will likely resolve by itself.      Hypertensive emergency Hypertensive encephalopathy Acute severe HTN with acute flash pulmonary edema Blood pressure today now low -Hold losartan with kidney injury - Hold acebutolol    Hypothyroidism TSH normal -Continue levothyroxine        Disposition: Status is: Inpatient  Remains inpatient appropriate because:Persistent severe electrolyte disturbances   Dispo: The patient is from: Home              Anticipated d/c is to: SNF              Anticipated d/c date is: 1 day              Patient currently is not medically stable to d/c.              MDM: The below labs and imaging reports were reviewed and summarized above.  Medication management as above.   DVT prophylaxis: enoxaparin (LOVENOX) injection 40 mg Start: 12/10/20 1000  Code Status: DNR Family Communication: Wife by phone    Consultants:   Cardiology  Procedures:   1/17 echocardiogram: EF, normal valves   1/20 ultrasound thoracentesis, attempted, no fluid available            Subjective: Patient still confused, somnolent.  No new cough, fever, respiratory distress, sputum.  No vomiting, or diarrhea       Objective: Vitals:   12/13/20 [id number] 12/13/20 0221 12/13/20 [id number] 12/13/20 [id number]  BP: 100/73 93/65 104/67 138/85  Pulse: 98 94 90 99  Resp: 18 18 18    Temp: 98.2 F (36.8 C) 97.9 F (36.6 C) (!) 97.3 F (36.3 C)   TempSrc: Oral Oral    SpO2: 97% 98% 94% 95%  Weight:      Height:        Intake/Output Summary (Last 24 hours) at 12/13/2020 1127 Last data filed at 12/12/2020 2213 Gross per 24 hour  Intake --  Output 350 ml  Net -350 ml   Filed Weights   12/09/20 2159  Weight: 74.8 kg    Examination: General appearance: Thin, frail, elderly adult male, lying in bed, appears somnolent, stirs to name, makes no intelligible responses     HEENT:  Anicteric, conjunctive pink, lids and lashes normal for age, no nasal deformity, discharge, or epistaxis, lips dry, oropharynx tacky dry, no oral lesions, hearing seems diminished Skin:  Cardiac: RRR, soft systolic murmur, JVP not distended, no lower extremity edema Respiratory: Normal respiratory rate and rhythm, lungs clear without rales or wheezes Abdomen: Abdomen without distention, tenderness, rigidity, guarding.  Normal liver and spleen. MSK: Loss of subcutaneous muscle mass and fat, moderate.  No joint deformities or effusions. Neuro: Does not follow commands, opens eyes, then closes them again, pupils seem symmetric, he makes some purposeful movements grabbing at things with mitts on, but makes no intelligible verbalizations. Psych: Sleepy, unable to assess.  Disoriented.     Data Reviewed: I have personally reviewed following labs and imaging studies:  CBC: Recent Labs  Lab 12/09/20 2207 12/10/20 0739  WBC 14.0* 6.0  NEUTROABS 7.2  --   HGB 14.7 14.5  HCT 45.8 43.0  MCV 88.9 86.9  PLT 277 474   Basic Metabolic Panel: Recent Labs  Lab 12/09/20 2207 12/10/20 0739 12/11/20 0516 12/12/20 0528 12/13/20 0425  NA 143  --  144 148* 151*  K 4.2  --  3.2* 3.2* 3.1*  CL 109  --  104 108 110  CO2 23  --  25 27 27   GLUCOSE 217*  --  102* 112* 105*  BUN 31*  --  32* 34* 46*  CREATININE 1.15 0.99 1.16 1.34* 1.51*  CALCIUM 8.7*  --  9.0 8.9 9.0  MG  --   --   --   --  2.3   GFR: Estimated Creatinine Clearance: 31.6 mL/min (A) (by C-G formula based on SCr of 1.51 mg/dL (H)). Liver Function Tests: Recent Labs  Lab 12/13/20 0425  AST 60*  ALT 42  ALKPHOS 74  BILITOT 1.9*  PROT 6.2*  ALBUMIN 3.2*   No results for input(s): LIPASE, AMYLASE in the last 168 hours. No results for input(s): AMMONIA in the last 168 hours. Coagulation Profile: Recent Labs  Lab 12/09/20 2207  INR 1.1   Cardiac Enzymes: No results for input(s): CKTOTAL, CKMB, CKMBINDEX, TROPONINI in the  last 168 hours. BNP (last 3 results) No results for input(s): PROBNP in the last 8760 hours. HbA1C: No results for input(s): HGBA1C in the last 72 hours. CBG: No results for input(s): GLUCAP in the last 168 hours. Lipid Profile: No results for input(s): CHOL, HDL, LDLCALC, TRIG, CHOLHDL, LDLDIRECT in the last 72 hours. Thyroid Function Tests: No results for input(s): TSH, T4TOTAL, FREET4, T3FREE, THYROIDAB in the last 72 hours. Anemia Panel: No results for input(s): VITAMINB12, FOLATE, FERRITIN, TIBC, IRON, RETICCTPCT in the last 72 hours. Urine analysis:    Component Value Date/Time   BILIRUBINUR Negative 11/30/2019 1345  PROTEINUR Negative 11/30/2019 1345   UROBILINOGEN 0.2 11/30/2019 1345   NITRITE Negative 11/30/2019 1345   LEUKOCYTESUR Negative 11/30/2019 1345   Sepsis Labs: @LABRCNTIP (procalcitonin:4,lacticacidven:4)  ) Recent Results (from the past 240 hour(s))  Resp Panel by RT-PCR (Flu A&B, Covid) Nasopharyngeal Swab     Status: None   Collection Time: 12/09/20 10:18 PM   Specimen: Nasopharyngeal Swab; Nasopharyngeal(NP) swabs in vial transport medium  Result Value Ref Range Status   SARS Coronavirus 2 by RT PCR NEGATIVE NEGATIVE Final    Comment: (NOTE) SARS-CoV-2 target nucleic acids are NOT DETECTED.  The SARS-CoV-2 RNA is generally detectable in upper respiratory specimens during the acute phase of infection. The lowest concentration of SARS-CoV-2 viral copies this assay can detect is 138 copies/mL. A negative result does not preclude SARS-Cov-2 infection and should not be used as the sole basis for treatment or other patient management decisions. A negative result may occur with  improper specimen collection/handling, submission of specimen other than nasopharyngeal swab, presence of viral mutation(s) within the areas targeted by this assay, and inadequate number of viral copies(<138 copies/mL). A negative result must be combined with clinical observations,  patient history, and epidemiological information. The expected result is Negative.  Fact Sheet for Patients:  EntrepreneurPulse.com.au  Fact Sheet for Healthcare Providers:  IncredibleEmployment.be  This test is no t yet approved or cleared by the [place] FDA and  has been authorized for detection and/or diagnosis of SARS-CoV-2 by FDA under an Emergency Use Authorization (EUA). This EUA will remain  in effect (meaning this test can be used) for the duration of the COVID-19 declaration under Section 564(b)(1) of the Act, 21 U.S.C.section 360bbb-3(b)(1), unless the authorization is terminated  or revoked sooner.       Influenza A by PCR NEGATIVE NEGATIVE Final   Influenza B by PCR NEGATIVE NEGATIVE Final    Comment: (NOTE) The Xpert Xpress SARS-CoV-2/FLU/RSV plus assay is intended as an aid in the diagnosis of influenza from Nasopharyngeal swab specimens and should not be used as a sole basis for treatment. Nasal washings and aspirates are unacceptable for Xpert Xpress SARS-CoV-2/FLU/RSV testing.  Fact Sheet for Patients: EntrepreneurPulse.com.au  Fact Sheet for Healthcare Providers: IncredibleEmployment.be  This test is not yet approved or cleared by the [place] FDA and has been authorized for detection and/or diagnosis of SARS-CoV-2 by FDA under an Emergency Use Authorization (EUA). This EUA will remain in effect (meaning this test can be used) for the duration of the COVID-19 declaration under Section 564(b)(1) of the Act, 21 U.S.C. section 360bbb-3(b)(1), unless the authorization is terminated or revoked.  Performed at [facility], [address] [place], [place] [address]   Culture, blood (Routine X 2) w Reflex to ID Panel     Status: None (Preliminary result)   Collection Time: 12/09/20 10:53 PM   Specimen: BLOOD  Result Value Ref Range Status   Specimen Description BLOOD  BLOOD LEFT FOREARM  Final   Special Requests   Final    BOTTLES DRAWN AEROBIC AND ANAEROBIC Blood Culture results may not be optimal due to an inadequate volume of blood received in culture bottles   Culture   Final    NO GROWTH 4 DAYS Performed at [facility], [address] [place], [place] [address]    Report Status PENDING  Incomplete  Culture, blood (Routine X 2) w Reflex to ID Panel     Status: None (Preliminary result)   Collection Time: 12/09/20 10:54 PM  Specimen: BLOOD  Result Value Ref Range Status   Specimen Description BLOOD BLOOD LEFT FOREARM  Final   Special Requests   Final    BOTTLES DRAWN AEROBIC AND ANAEROBIC Blood Culture results may not be optimal due to an excessive volume of blood received in culture bottles   Culture   Final    NO GROWTH 4 DAYS Performed at [facility], [address] [place], [place] [address]    Report Status PENDING  Incomplete         Radiology Studies: [place] CHEST (PLEURAL EFFUSION)  Result Date: 12/13/2020 CLINICAL DATA:  Suggestion of bilateral pleural effusions by prior chest x-ray. EXAM: CHEST ULTRASOUND COMPARISON:  Chest x-ray on 12/09/2000 FINDINGS: Ultrasound demonstrates a small right pleural effusion and trace left pleural fluid. There was not enough fluid volume to perform thoracentesis on the right. IMPRESSION: Lack of sufficient pleural fluid volume to perform thoracentesis. Small right pleural effusion and trace left pleural effusion present. Electronically Signed   By: Aletta Edouard M.D.   On: 12/13/2020 10:50        Scheduled Meds: . aspirin EC  81 mg Oral Daily  . enoxaparin (LOVENOX) injection  40 mg Subcutaneous Q24H  . levothyroxine  50 mcg Oral Q0600  . potassium chloride  20 mEq Oral Once  . simvastatin  40 mg Oral q1800  . sodium chloride flush  3 mL Intravenous Q12H   Continuous Infusions: . sodium chloride    . dextrose 5 % with KCl 20 mEq / L       LOS: 3 days    Time  spent: 25 minutes    Edwin Dada, MD [facility] 12/13/2020, 11:27 AM     Please page though [employer] or Epic secure chat:  For [employer], Adult nurse

## 2020-12-13 NOTE — Progress Notes (Signed)
SLP Cancellation Note  Patient Details Name: Manuel Reynolds MRN: [id number] DOB: February 07, 1930   Cancelled treatment:       Reason Eval/Treat Not Completed:  (chart reviewed; diet modified until BSE completed). Significant reports of Dysphagia per OT, NSG(no po meds given today thus far) d/t pt's presentation. ST services modified the diet w/ strict aspiration precautions including Not feeding unless fully alert to participate safely. Recommend holding any po's if overt s/s of aspiration noted by NSG. ST services will f/u tomorrow w/ BSE.     Orinda Kenner, MS, CCC-SLP Speech Language Pathologist Rehab Services [phone] [facility] 12/13/2020, 3:04 PM

## 2020-12-13 NOTE — NC FL2 (Signed)
[place] LEVEL OF CARE SCREENING TOOL     IDENTIFICATION  Patient Name: Manuel Reynolds Birthdate: November 01, 1930 Sex: male Admission Date (Current Location): 12/09/2020  [facility] and [place] Number:  Engineering geologist and Address:  [facility], [address], [place], [place] [address]      Provider Number: [id number]  Attending Physician Name and Address:  Edwin Dada, *  Relative Name and Phone Number:  Gnau,[name] (Spouse)   [phone] (Mobile)    Current Level of Care: Hospital Recommended Level of Care: [place] Prior Approval Number:    Date Approved/Denied:   PASRR Number: pending  Discharge Plan: SNF    Current Diagnoses: Patient Active Problem List   Diagnosis Date Noted  . Flash pulmonary edema ([place]) 12/12/2020  . CHF exacerbation ([place]) 12/11/2020  . CHF (congestive heart failure) ([place]) 12/10/2020  . History of deep venous thrombosis (DVT) of distal vein of left lower extremity 06/27/2020  . Late onset Alzheimer's disease with behavioral disturbance ([place]) 03/13/2020  . Anxiety, generalized 03/08/2020  . Loss of memory 03/08/2020  . Sleeping difficulty 03/08/2020  . Visual hallucination 03/08/2020  . Other insomnia 12/28/2019  . Pre-diabetes 12/17/2017  . Hypnopompic hallucination 06/19/2017  . CKD (chronic kidney disease), stage III ([place]) 03/12/2017  . Plantar fasciitis 03/11/2017  . Bilateral lower extremity edema 08/17/2015  . Lymphoma in remission ([place]) 05/25/2015  . Benign hypertension with CKD (chronic kidney disease) stage III ([place]) 05/11/2015  . Hyperlipidemia 05/11/2015  . Hypothyroidism 05/11/2015  . Anxiety 05/11/2015  . ED (erectile dysfunction) 05/11/2015    Orientation RESPIRATION BLADDER Height & Weight        Normal Incontinent,External catheter Weight: 165 lb (74.8 kg) Height:  5\' 7"  (170.2 cm)  BEHAVIORAL SYMPTOMS/MOOD NEUROLOGICAL BOWEL NUTRITION STATUS      (Dementia)   Diet (heart diet, thin liquids)  AMBULATORY STATUS COMMUNICATION OF NEEDS Skin   Extensive Assist Verbally Skin abrasions (left leg - abrasion)                       Personal Care Assistance Level of Assistance  Bathing,Feeding,Dressing Bathing Assistance: Maximum assistance Feeding assistance: Maximum assistance Dressing Assistance: Maximum assistance     Functional Limitations Info             SPECIAL CARE FACTORS FREQUENCY  PT (By licensed PT),OT (By licensed OT)     PT Frequency: 5 x/week OT Frequency: 5 x/week            Contractures      Additional Factors Info  Code Status,Allergies Code Status Info: DNR Allergies Info: sulfa antibiotics, sulfacetamide sodium           Current Medications (12/13/2020):  This is the current hospital active medication list Current Facility-Administered Medications  Medication Dose Route Frequency Provider Last Rate Last Admin  . 0.9 %  sodium chloride infusion  250 mL Intravenous PRN Myles Rosenthal A, MD      . acetaminophen (TYLENOL) tablet 650 mg  650 mg Oral Q4H PRN Clance Boll, MD   650 mg at 12/12/20 2237  . aspirin EC tablet 81 mg  81 mg Oral Daily Myles Rosenthal A, MD   81 mg at 12/12/20 0913  . dextrose 5 % with KCl 20 mEq / L  infusion  20 mEq Intravenous Continuous Danford, Suann Larry, MD      . enoxaparin (LOVENOX) injection 40 mg  40 mg Subcutaneous Q24H  Clance Boll, MD   40 mg at 12/12/20 0913  . hydrALAZINE (APRESOLINE) injection 10 mg  10 mg Intravenous Q6H PRN Clance Boll, MD   10 mg at 12/12/20 2231  . levothyroxine (SYNTHROID) tablet 50 mcg  50 mcg Oral Q0600 Clance Boll, MD   50 mcg at 12/12/20 0546  . ondansetron (ZOFRAN) injection 4 mg  4 mg Intravenous Q6H PRN Myles Rosenthal A, MD      . potassium chloride SA (KLOR-CON) CR tablet 20 mEq  20 mEq Oral Once Edwin Dada, MD      . simvastatin (ZOCOR) tablet 40 mg  40 mg Oral q1800 Myles Rosenthal A, MD   40 mg at 12/12/20 1744  . sodium chloride flush (NS) 0.9 % injection 3 mL  3 mL Intravenous Q12H Myles Rosenthal A, MD   3 mL at 12/12/20 2037  . sodium chloride flush (NS) 0.9 % injection 3 mL  3 mL Intravenous PRN Clance Boll, MD         Discharge Medications: Please see discharge summary for a list of discharge medications.  Relevant Imaging Results:  Relevant Lab Results:   Additional Information SS #: [place], LCSW

## 2020-12-13 NOTE — Care Management Important Message (Signed)
Important Message  Patient Details  Name: Starling Christofferson MRN: [id number] Date of Birth: March 15, 1930   Medicare Important Message Given:  Yes     Juliann Pulse A Viera Okonski 12/13/2020, 11:13 AM

## 2020-12-13 NOTE — Progress Notes (Signed)
Interventional Radiology Progress Note  US shows small right pleural effusion and trace left pleural fluid. Lack of sufficient volume to perform thoracentesis.  Venetia Night. Kathlene Cote, M.D Pager:  [phone]

## 2020-12-13 NOTE — Progress Notes (Signed)
   12/13/20 0019  Assess: MEWS Score  Temp 98.2 F (36.8 C)  BP 100/73  Pulse Rate 98  Resp 18  Level of Consciousness Alert  SpO2 97 %  O2 Device Room Air  Assess: if the MEWS score is Yellow or Red  Were vital signs taken at a resting state? Yes  Focused Assessment No change from prior assessment  Early Detection of Sepsis Score *See Row Information* Low  Treat  Pain Scale PAINAD  Breathing 0  Negative Vocalization 0  Facial Expression 0  Body Language 0  Consolability 0  PAINAD Score 0  Document  Patient Outcome Stabilized after interventions  Pt condition improved.

## 2020-12-13 NOTE — Progress Notes (Signed)
Patient Name: Manuel Reynolds Date of Encounter: 12/13/2020  Hospital Problem List     Active Problems:   CHF (congestive heart failure) ([place])   CHF exacerbation (HCC)   Flash pulmonary edema [facility])    Patient Profile     87 year old male with a past medical history significant for Alzheimer's dementia, lymphoma, hypothyroidism, chronic kidney disease, and hypertension who presented to the ED on 12/09/20 for acute shortness of breath.  Workup was significant for a chest xray revealing pulmonary edema and bilateral pleural effusions with patchy opacities, with underlying infection difficult to exclude, ECG revealing sinus rhythm with ectopy but no evidence of acute ischemia, WBC of 14, BNP of 2103, high sensitivity troponin 46, 78 respectively, and COVID-19 negative. Patient is a  poor historian but appears to be in no acute distress.  Currently working with physical therapy, standing with assistance.  Appears to deny chest pain or shortness of breath.  Subjective   Lethargic. Not able to provide history  Inpatient Medications    . acebutolol  200 mg Oral Daily  . aspirin EC  81 mg Oral Daily  . enoxaparin (LOVENOX) injection  40 mg Subcutaneous Q24H  . levothyroxine  50 mcg Oral Q0600  . potassium chloride  20 mEq Oral Once  . simvastatin  40 mg Oral q1800  . sodium chloride flush  3 mL Intravenous Q12H    Vital Signs    Vitals:   12/12/20 2341 12/13/20 0019 12/13/20 0221 12/13/20 0342  BP: 112/64 100/73 93/65 104/67  Pulse: 99 98 94 90  Resp:  18 18 18   Temp:  98.2 F (36.8 C) 97.9 F (36.6 C) (!) 97.3 F (36.3 C)  TempSrc:  Oral Oral   SpO2:  97% 98% 94%  Weight:      Height:        Intake/Output Summary (Last 24 hours) at 12/13/2020 0734 Last data filed at 12/12/2020 2213 Gross per 24 hour  Intake --  Output 350 ml  Net -350 ml   Filed Weights   12/09/20 2159  Weight: 74.8 kg    Physical Exam    GEN: Well nourished, well developed, in no acute distress.   HEENT: normal.  Neck: Supple, no JVD, carotid bruits, or masses. Cardiac: RRR, no murmurs, rubs, or gallops. No clubbing, cyanosis, edema.  Radials/DP/PT 2+ and equal bilaterally.  Respiratory:  Respirations regular and unlabored, clear to auscultation bilaterally. GI: Soft, nontender, nondistended, BS + x 4. MS: no deformity or atrophy. Skin: warm and dry, no rash. Neuro:  Poor historian.  Labs    CBC Recent Labs    12/10/20 0739  WBC 6.0  HGB 14.5  HCT 43.0  MCV 86.9  PLT 751   Basic Metabolic Panel Recent Labs    12/12/20 0528 12/13/20 0425  NA 148* 151*  K 3.2* 3.1*  CL 108 110  CO2 27 27  GLUCOSE 112* 105*  BUN 34* 46*  CREATININE 1.34* 1.51*  CALCIUM 8.9 9.0   Liver Function Tests Recent Labs    12/13/20 0425  AST 60*  ALT 42  ALKPHOS 74  BILITOT 1.9*  PROT 6.2*  ALBUMIN 3.2*   No results for input(s): LIPASE, AMYLASE in the last 72 hours. Cardiac Enzymes No results for input(s): CKTOTAL, CKMB, CKMBINDEX, TROPONINI in the last 72 hours. BNP No results for input(s): BNP in the last 72 hours. D-Dimer No results for input(s): DDIMER in the last 72 hours. Hemoglobin A1C No results for  input(s): HGBA1C in the last 72 hours. Fasting Lipid Panel No results for input(s): CHOL, HDL, LDLCALC, TRIG, CHOLHDL, LDLDIRECT in the last 72 hours. Thyroid Function Tests Recent Labs    12/10/20 0739  TSH 3.720    Telemetry       ECG       Radiology    DG Chest Portable 1 View  Result Date: 12/09/2020 CLINICAL DATA:  Quit dyspnea, hypoxic, question fluid overload EXAM: PORTABLE CHEST 1 VIEW COMPARISON:  None. FINDINGS: Areas of mixed hazy interstitial and patchy opacity are present in both lungs with a perihilar and basilar predominance. Some additional areas of more coalescent opacity are seen in the medial lung bases with bandlike opacities in both lower lobes, possibly scarring or subsegmental atelectatic change. The pulmonary vascularity is  indistinct. Small bilateral effusions. No pneumothoraces. The cardiac silhouette is likely enlarged, possibly cardiomegaly or pericardial effusion. The aorta is calcified. The remaining cardiomediastinal contours are unremarkable. No acute osseous or soft tissue abnormality. Degenerative changes are present in the imaged spine and shoulders. Telemetry leads and external support devices overlie the chest. IMPRESSION: 1. Features suggestive of CHF/volume overload with pulmonary edema and bilateral effusions. 2. More patchy and coalescent opacities could reflect developing alveolar edema though underlying infection is difficult to exclude. 3. Enlarged cardiac silhouette, possibly cardiomegaly or pericardial effusion. Electronically Signed   By: Lovena Le M.D.   On: 12/09/2020 22:45   ECHOCARDIOGRAM COMPLETE  Result Date: 12/10/2020    ECHOCARDIOGRAM REPORT   Patient Name:   Manuel Reynolds Date of Exam: 12/10/2020 Medical Rec #:  [id number]    Height:       67.0 in Accession #:    [id number]   Weight:       165.0 lb Date of Birth:  31-Dec-1929    BSA:          1.863 m Patient Age:    89 years     BP:           148/93 mmHg Patient Gender: M            HR:           53 bpm. Exam Location:  [facility] Procedure: 2D Echo and Cardiac Doppler Indications:     CHF-acute systolic J09.32  History:         Patient has no prior history of Echocardiogram examinations.                  Risk Factors:Hypertension.  Sonographer:     Sherrie Sport RDCS (AE) Referring Phys:  [id number] Victoriano Lain A THOMAS Diagnosing Phys: Kathlyn Sacramento MD  Sonographer Comments: Technically challenging study due to limited acoustic windows, no apical window and suboptimal parasternal window. Image acquisition challenging due to patient body habitus. IMPRESSIONS  1. Left ventricular ejection fraction, by estimation, is 50 to 55%. The left ventricle has low normal function. Left ventricular endocardial border not optimally defined to evaluate regional wall motion.  There is moderate left ventricular hypertrophy. Left ventricular diastolic parameters are indeterminate.  2. Right ventricular systolic function is normal. The right ventricular size is normal.  3. Moderate pleural effusion in the left lateral region.  4. The mitral valve is normal in structure. No evidence of mitral valve regurgitation. No evidence of mitral stenosis.  5. The aortic valve is normal in structure. Aortic valve regurgitation is not visualized. Mild to moderate aortic valve sclerosis. There is likely some degree of aortic stenosis. However, no gradient was recorded.  6. The inferior vena cava is normal in size with greater than 50% respiratory variability, suggesting right atrial pressure of 3 mmHg. FINDINGS  Left Ventricle: Left ventricular ejection fraction, by estimation, is 50 to 55%. The left ventricle has low normal function. Left ventricular endocardial border not optimally defined to evaluate regional wall motion. The left ventricular internal cavity  size was normal in size. There is moderate left ventricular hypertrophy. Left ventricular diastolic parameters are indeterminate. Right Ventricle: The right ventricular size is normal. No increase in right ventricular wall thickness. Right ventricular systolic function is normal. Left Atrium: Left atrial size was normal in size. Right Atrium: Right atrial size was normal in size. Pericardium: There is no evidence of pericardial effusion. Mitral Valve: The mitral valve is normal in structure. No evidence of mitral valve regurgitation. No evidence of mitral valve stenosis. Tricuspid Valve: The tricuspid valve is normal in structure. Tricuspid valve regurgitation is not demonstrated. No evidence of tricuspid stenosis. Aortic Valve: The aortic valve is normal in structure. Aortic valve regurgitation is not visualized. Mild to moderate aortic valve sclerosis/calcification is present, without any evidence of aortic stenosis. Pulmonic Valve: The pulmonic  valve was normal in structure. Pulmonic valve regurgitation is not visualized. No evidence of pulmonic stenosis. Aorta: The aortic root is normal in size and structure. Venous: The inferior vena cava is normal in size with greater than 50% respiratory variability, suggesting right atrial pressure of 3 mmHg. IAS/Shunts: No atrial level shunt detected by color flow Doppler. Additional Comments: There is a moderate pleural effusion in the left lateral region.  LEFT VENTRICLE PLAX 2D LVIDd:         3.10 cm LVIDs:         2.80 cm LV PW:         1.50 cm LV IVS:        1.80 cm LVOT diam:     2.00 cm LVOT Area:     3.14 cm  LEFT ATRIUM         Index LA diam:    3.60 cm 1.93 cm/m                        PULMONIC VALVE AORTA                 PV Vmax:        0.54 m/s Ao Root diam: 3.20 cm PV Peak grad:   1.1 mmHg                       RVOT Peak grad: 2 mmHg   SHUNTS Systemic Diam: 2.00 cm Kathlyn Sacramento MD Electronically signed by Kathlyn Sacramento MD Signature Date/Time: 12/10/2020/1:37:06 PM    Final     Assessment & Plan     Active Problems:   CHF (congestive heart failure) ([place])    1.  Acute CHF              -Pulmonary edema on chest xray on admission.              -Agree with holding diuresis due to rising creatinine. K 3.1. Continue supplementation.               -Continue I's and O's, -1239 cc since admission             -Echocardiogram this admission revealing preserved LV systolic function  2.  Elevated troponin              -Borderline elevated, likely demand ischemia in the setting of new onset CHF; no further invasive cardiac workup indicated at this time            3.  Hypertension              -Relatively soft blood pressure. Holding antihypertensives.   4.  Hyperlipidemia              -Continue simvastatin 40mg  daily   Signed, Javier Docker. Chevette Fee MD 12/13/2020, 7:34 AM  Pager: [phone]

## 2020-12-14 ENCOUNTER — Inpatient Hospital Stay: Payer: Medicare Other

## 2020-12-14 LAB — CBC
HCT: 48.7 % (ref 39.0–52.0)
Hemoglobin: 16.3 g/dL (ref 13.0–17.0)
MCH: 28.8 pg (ref 26.0–34.0)
MCHC: 33.5 g/dL (ref 30.0–36.0)
MCV: 86 fL (ref 80.0–100.0)
Platelets: 254 10*3/uL (ref 150–400)
RBC: 5.66 MIL/uL (ref 4.22–5.81)
RDW: 15.7 % — ABNORMAL HIGH (ref 11.5–15.5)
WBC: 12.3 10*3/uL — ABNORMAL HIGH (ref 4.0–10.5)
nRBC: 0 % (ref 0.0–0.2)

## 2020-12-14 LAB — BASIC METABOLIC PANEL
Anion gap: 14 (ref 5–15)
BUN: 63 mg/dL — ABNORMAL HIGH (ref 8–23)
CO2: 28 mmol/L (ref 22–32)
Calcium: 9 mg/dL (ref 8.9–10.3)
Chloride: 109 mmol/L (ref 98–111)
Creatinine, Ser: 1.53 mg/dL — ABNORMAL HIGH (ref 0.61–1.24)
GFR, Estimated: 43 mL/min — ABNORMAL LOW (ref >60)
Glucose, Bld: 115 mg/dL — ABNORMAL HIGH (ref 70–99)
Potassium: 3.1 mmol/L — ABNORMAL LOW (ref 3.5–5.1)
Sodium: 151 mmol/L — ABNORMAL HIGH (ref 135–145)

## 2020-12-14 LAB — CULTURE, BLOOD (ROUTINE X 2)
Culture: NO GROWTH
Culture: NO GROWTH

## 2020-12-14 MED ORDER — POTASSIUM CL IN DEXTROSE 5% 20 MEQ/L IV SOLN
20.0000 meq | INTRAVENOUS | Status: DC
  Administered 2020-12-14: 20 meq via INTRAVENOUS
  Filled 2020-12-14 (×3): qty 1000

## 2020-12-14 MED ORDER — POTASSIUM CL IN DEXTROSE 5% 20 MEQ/L IV SOLN
20.0000 meq | INTRAVENOUS | Status: DC
  Administered 2020-12-14 – 2020-12-16 (×6): 20 meq via INTRAVENOUS
  Filled 2020-12-14 (×7): qty 1000

## 2020-12-14 NOTE — TOC Progression Note (Addendum)
Transition of Care [facility]) - Progression Note    Patient Details  Name: Manuel Reynolds MRN: [id number] Date of Birth: 10/13/1930  Transition of Care [facility]) CM/SW [place], LCSW Phone Number: 12/14/2020, 2:07 PM  Clinical Narrative:   Per MD patient not ready for DC today. CSW reached out to [facility] [place] and [facility] about possibly admitting patient over weekend if patient is medically ready. Both SNFs not able to take patients over weekend due to staffing. Per Admissions, they should be able to accept patient on Monday. Informed MD.          Expected Discharge Plan and Services                                                 Social Determinants of Health (SDOH) Interventions    Readmission Risk Interventions No flowsheet data found.

## 2020-12-14 NOTE — Evaluation (Signed)
Clinical/Bedside Swallow Evaluation Patient Details  Name: Manuel Reynolds MRN: [id number] Date of Birth: 05-Nov-1930  Today's Date: 12/14/2020 Time: SLP Start Time (ACUTE ONLY): 0900 SLP Stop Time (ACUTE ONLY): 1000 SLP Time Calculation (min) (ACUTE ONLY): 60 min  Past Medical History:  Past Medical History:  Diagnosis Date   Anxiety    Hypertension    Lymphoma malignant, nodular, lymphocytic ([place]) 05/25/2015   Thyroid disease    Past Surgical History:  Past Surgical History:  Procedure Laterality Date   EYE SURGERY  2010   cataract   HPI:  Patient is an 87 year old male whose wife reports patient c/o of shortness of breath while eating dinner, could hear crackling noises coming from his chest. She called EMS. EMS noted 45% on RA, complicated by agitation with patient pulling off bipap. Arrived to ED on NRB 15L/min sat 95% PMh includes Dementia, alzheimers, anxiety, hypothyroidism, lymphoma in remission, BLE edema on prm lasix, CKD stage III.  CXR: "Features suggestive of CHF/volume overload with pulmonary edema  and bilateral effusions. 2. More patchy and coalescent opacities could reflect developing  alveolar edema though underlying infection is difficult to exclude.".  Staff have noted pt's difficulty w/ oral intake; lack of intake in past 2 days.   Assessment / Plan / Recommendation Clinical Impression  Pt appears to present w/ significant oropharyngeal phase dysphagia hampered by little to no oro-lingual movements during bolus acceptance or management. W/ all bolus trials of ice chips, tsps of water, and 1/2 tsp of puree, pt did not respond to the presentation of the boluses w/ appropriate oral prep and attention. Verbal/tactile cues given to encourage jaw/labial closing to focus on A-P transfer and swalliowing w/ each trial. Bolus trials of small TSPs of water were presented w/ a much delayed pharyngeal swallow following - suspect bolus material spilled into the pharynx. Overt  coughing x2. Laryngeal excursion was reduced upon palpation. Swallows appeared incomplete. Oral phase presentation included poor lingual manipulation of boluses, no controlled A-P transfer, and bolus material laying orally in floor of mouth(yaunker used to suction as well). Pt presented w/ a slight mouth open posture at rest but was able to achieve labial closure w/ cues, He also achieved min lingual protrustion and ROM(to lips). Gag reflex+. Noted a severely Dried tongue surface during oral care provided by SLP. No further po's attempted secondary to pt's presentation and increased risk for aspiration/choking. Recommend strict NPO status w/ frequent oral care for hygiene and stimulation of swallowing next 2-3 days. MD consulted re: above and pt's NPO status; need for hydration support -- unsure if new Neurological event vs Baseline Dementia and dehydration. NSG updated. ST services will f/u w/ ongoing oral assessment/awareness stimulation for swallowing in hopes to establish and oral diet next few days. Dietician f/u recommended for support.  SLP Visit Diagnosis: Dysphagia, oropharyngeal phase (R13.12) (dementia baseline)    Aspiration Risk  Severe aspiration risk;Risk for inadequate nutrition/hydration    Diet Recommendation  NPO w/ frequent oral care for hygiene and stimulation of swallowing; aspiration precautions  Medication Administration: Via alternative means    Other  Recommendations Recommended Consults:  (Dietician f/u; Palliative Care f/u) Oral Care Recommendations: Oral care QID;Staff/trained caregiver to provide oral care Other Recommendations:  (TBD)   Follow up Recommendations  (TBD)      Frequency and Duration min 3x week  2 weeks       Prognosis Prognosis for Safe Diet Advancement: Guarded Barriers to Reach Goals: Cognitive deficits;Time post onset;Severity of  deficits      Swallow Study   General Date of Onset: 12/09/20 HPI: Patient is an 87 year old male whose wife  reports patient c/o of shortness of breath while eating dinner, could hear crackling noises coming from his chest. She called EMS. EMS noted 09% on RA, complicated by agitation with patient pulling off bipap. Arrived to ED on NRB 15L/min sat 95% PMh includes Dementia, alzheimers, anxiety, hypothyroidism, lymphoma in remission, BLE edema on prm lasix, CKD stage III.  CXR: "Features suggestive of CHF/volume overload with pulmonary edema  and bilateral effusions. 2. More patchy and coalescent opacities could reflect developing  alveolar edema though underlying infection is difficult to exclude.".  Staff have noted pt's difficulty w/ oral intake; lack of intake in past 2 days. Type of Study: Bedside Swallow Evaluation Previous Swallow Assessment: none Diet Prior to this Study: Dysphagia 1 (puree);Nectar-thick liquids (downgraded by SLP; regular prior) Temperature Spikes Noted: No (wbc 12.3) Respiratory Status: Room air History of Recent Intubation: No Behavior/Cognition: Alert;Cooperative;Pleasant mood;Confused;Distractible;Requires cueing (baseline Dementia) Oral Cavity Assessment: Dry;Dried secretions Oral Care Completed by SLP: Yes Oral Cavity - Dentition: Poor condition;Missing dentition Vision:  (n/a) Self-Feeding Abilities: Total assist Patient Positioning: Upright in bed (needed positioning) Baseline Vocal Quality: Low vocal intensity (muttered responses x2 only) Volitional Cough: Cognitively unable to elicit Volitional Swallow: Unable to elicit    Oral/Motor/Sensory Function Overall Oral Motor/Sensory Function: Severe impairment Facial ROM: Reduced right (min) Facial Symmetry: Abnormal symmetry right (min) Facial Strength:  (reduced overall) Lingual ROM:  (reduced but able to protrude to lips) Lingual Symmetry: Within Functional Limits Lingual Strength: Reduced Velum:  (CNT) Mandible:  (weakness in general)   Ice Chips Ice chips: Impaired Presentation: Spoon (fed) Oral Phase  Impairments: Reduced labial seal;Reduced lingual movement/coordination;Poor awareness of bolus Oral Phase Functional Implications: Prolonged oral transit   Thin Liquid Thin Liquid: Impaired Presentation: Spoon (fed) Oral Phase Impairments: Reduced labial seal;Reduced lingual movement/coordination;Poor awareness of bolus Oral Phase Functional Implications: Prolonged oral transit Pharyngeal  Phase Impairments: Suspected delayed Swallow;Cough - Immediate    Nectar Thick Nectar Thick Liquid: Not tested   Honey Thick Honey Thick Liquid: Not tested   Puree Puree: Impaired Presentation: Spoon (fed) Oral Phase Impairments: Poor awareness of bolus;Reduced labial seal;Reduced lingual movement/coordination Oral Phase Functional Implications:  (no lingual movement) Other Comments: removed   Solid     Solid: Not tested       Orinda Kenner, MS, CCC-SLP Speech Language Pathologist Rehab Services [phone] Manuel Reynolds 12/14/2020,12:19 PM

## 2020-12-14 NOTE — Progress Notes (Addendum)
[facility] PROGRESS NOTE    Manuel Reynolds  [id number] DOB: 23-Aug-1930 DOA: 12/09/2020 PCP: [name], DO      Brief Narrative:  Mr. Kump is an 87 y.o.M with Alzheimer's dementia, lives at home, hypothyroidism, Lymphoma in remission, HTN, CKD IIIa baseline Cr 1.1-1.3 who presented with acute onset hypoxia and dyspnea.    Patient had had leg swelling on and off for months, couldn't tolerate leg elevation or compression.  Finally, on day of admission he was at home with wife, eating supper when he suddenly asked for "an oxygen pill".  Then he slowly appeared to breath heavier and have crackling in his chest and appeared to be in distress.  On EMS arrival his O2 was 60% and in the ER, his CXR showed bilateral opacities and bilateral pleural effusions.  BP very severely elevated.  He was admitted and started on nitro, IV Lasix.            Assessment & Plan:  New onset acute diastolic CHF Likely flash pulmonary edema Patient admitted and started on IV Lasix.  Diuresed now 1.6L, and weaned off O2.  Subsequently developed AKI and hypokalemia, diuresis halted.  Remains off O2.  - Hold diuretics - Strict intake and output - Monitor daily BMP    Acute metabolic encephalopathy Alzheimer's dementia Delirium/encephalopathy is now main issue.  At baseline, the patient is able to ambulate with a walker, respond to questions, but is frequently nonsensical in his answers.  Lives at home.  The current state is acute, likely delirium in the setting of CHF, hypernatremia, hypokalemia, and hypertenison.  There has been no focal neurological deficit I can find on exam.  Today with SLP, he was noted to have +gag but some reduced palatal responses to food, consistentw with CNXII deficit and so MRI brain was ordered, which showed no brainstem lesion.  Stroke ruled out.  -Delirium precautions      Dysphagia This appears to be primarily due to  generalized weakness.  No neurological insult has occurred, MRI brain normal.  - Continue IV fluids - Consult Palliative care    Goals of Care In speaking with patient's wife, she was able to articulate clearly that he would not have wanted to end his final days or weeks of life, in a nursing home, nor moving from nursing home back to hospital, to nursing home.  In light of that, we will continue IV fluids, but if no significant improvement in mental status over the next 1-2 days, I would recommend involvement of Hospice, for referral home with home Hospice.       Hypernatremia Due to diuresis, poor PO intake given encephalopathy - Continue IV fluids - Continue D5W with K, increase to 150 cc/hr - Trend BMP   Hypokalemia Mag normal - Supp K  AKI on CKD stage IIIa Patient started on IV diuresis, creatinine went from 1.1 up to 1.5. No change today. - Hold diuretics - Continue IV fluids - Trend Cr       Left pleural effusion Ultrasound shows insufficient fluid for sampling.  Given the circumstances, highly suspect this is transudative from CHF.  This will likely resolve by itself.      Hypertensive emergency, resolved Acute severe HTN with acute flash pulmonary edema, resolved Hypertension BP nmormal - Hold losartan with kidney injury - Hold acebutolol    Hypothyroidism TSH normal - Continue levothyroxine        Disposition: Status is: Inpatient  Remains inpatient  appropriate because:Persistent severe electrolyte disturbances   Dispo: The patient is from: Home              Anticipated d/c is to: SNF              Anticipated d/c date is: 3 days              Patient currently is not medically stable to d/c.              MDM: The below labs and imaging reports were reviewed and summarized above.  Medication management as above.   DVT prophylaxis: enoxaparin (LOVENOX) injection 40 mg Start: 12/10/20 1000  Code Status:  DNR Family Communication: Wife by phone    Consultants:   Cardiology  Procedures:   1/17 echocardiogram: EF, normal valves   1/20 ultrasound thoracentesis, attempted, no fluid available            Subjective: Patient with no change in mentation, mostly obtunded.  Still not eating.  No new fever, cough, respiratory symptoms, vomiting, diarrhea.       Objective: Vitals:   12/14/20 0034 12/14/20 0451 12/14/20 0755 12/14/20 1115  BP: 118/75 128/80 134/68 115/67  Pulse: 88 87 84 89  Resp: 16 16 18 18   Temp: 98.4 F (36.9 C) 98.1 F (36.7 C) 98.4 F (36.9 C) 98.9 F (37.2 C)  TempSrc:      SpO2: 100%  98% 99%  Weight:      Height:        Intake/Output Summary (Last 24 hours) at 12/14/2020 1448 Last data filed at 12/14/2020 0617 Gross per 24 hour  Intake --  Output 400 ml  Net -400 ml   Filed Weights   12/09/20 2159  Weight: 74.8 kg    Examination: General appearance: Thin elderly male, lying in bed, somnolent     HEENT: Anicteric, conjunctival pink, lids and lashes normal.  No nasal deformity, discharge, or epistaxis.  Lips normal, dentition in good repair, oropharynx dry, no oral lesions Skin: No suspicious rashes or lesions Cardiac: Regular rate and rhythm, no murmurs, no lower extremity edema, no JVD Respiratory: Normal respiratory rate and rhythm, lungs clear without rales or wheezes Abdomen: Abdomen soft, no guarding, no grimace to palpation.  No hepatosplenomegaly MSK:  Neuro: Stirs to stimulation, but no purposeful movements, no meaningful responses to questions, does not follow commands.  Pupils equal, reactive.  Withdraws from pain Psych: Unable to assess          Data Reviewed: I have personally reviewed following labs and imaging studies:  CBC: Recent Labs  Lab 12/09/20 2207 12/10/20 0739 12/14/20 0803  WBC 14.0* 6.0 12.3*  NEUTROABS 7.2  --   --   HGB 14.7 14.5 16.3  HCT 45.8 43.0 48.7  MCV 88.9 86.9 86.0  PLT 277 245 676    Basic Metabolic Panel: Recent Labs  Lab 12/09/20 2207 12/10/20 0739 12/11/20 0516 12/12/20 0528 12/13/20 0425 12/14/20 0803  NA 143  --  144 148* 151* 151*  K 4.2  --  3.2* 3.2* 3.1* 3.1*  CL 109  --  104 108 110 109  CO2 23  --  25 27 27 28   GLUCOSE 217*  --  102* 112* 105* 115*  BUN 31*  --  32* 34* 46* 63*  CREATININE 1.15 0.99 1.16 1.34* 1.51* 1.53*  CALCIUM 8.7*  --  9.0 8.9 9.0 9.0  MG  --   --   --   --  2.3  --    GFR: Estimated Creatinine Clearance: 31.2 mL/min (A) (by C-G formula based on SCr of 1.53 mg/dL (H)). Liver Function Tests: Recent Labs  Lab 12/13/20 0425  AST 60*  ALT 42  ALKPHOS 74  BILITOT 1.9*  PROT 6.2*  ALBUMIN 3.2*   No results for input(s): LIPASE, AMYLASE in the last 168 hours. No results for input(s): AMMONIA in the last 168 hours. Coagulation Profile: Recent Labs  Lab 12/09/20 2207  INR 1.1   Cardiac Enzymes: No results for input(s): CKTOTAL, CKMB, CKMBINDEX, TROPONINI in the last 168 hours. BNP (last 3 results) No results for input(s): PROBNP in the last 8760 hours. HbA1C: No results for input(s): HGBA1C in the last 72 hours. CBG: No results for input(s): GLUCAP in the last 168 hours. Lipid Profile: No results for input(s): CHOL, HDL, LDLCALC, TRIG, CHOLHDL, LDLDIRECT in the last 72 hours. Thyroid Function Tests: No results for input(s): TSH, T4TOTAL, FREET4, T3FREE, THYROIDAB in the last 72 hours. Anemia Panel: No results for input(s): VITAMINB12, FOLATE, FERRITIN, TIBC, IRON, RETICCTPCT in the last 72 hours. Urine analysis:    Component Value Date/Time   BILIRUBINUR Negative 11/30/2019 1345   PROTEINUR Negative 11/30/2019 1345   UROBILINOGEN 0.2 11/30/2019 1345   NITRITE Negative 11/30/2019 1345   LEUKOCYTESUR Negative 11/30/2019 1345   Sepsis Labs: @LABRCNTIP (procalcitonin:4,lacticacidven:4)  ) Recent Results (from the past 240 hour(s))  Resp Panel by RT-PCR (Flu A&B, Covid) Nasopharyngeal Swab     Status: None    Collection Time: 12/09/20 10:18 PM   Specimen: Nasopharyngeal Swab; Nasopharyngeal(NP) swabs in vial transport medium  Result Value Ref Range Status   SARS Coronavirus 2 by RT PCR NEGATIVE NEGATIVE Final    Comment: (NOTE) SARS-CoV-2 target nucleic acids are NOT DETECTED.  The SARS-CoV-2 RNA is generally detectable in upper respiratory specimens during the acute phase of infection. The lowest concentration of SARS-CoV-2 viral copies this assay can detect is 138 copies/mL. A negative result does not preclude SARS-Cov-2 infection and should not be used as the sole basis for treatment or other patient management decisions. A negative result may occur with  improper specimen collection/handling, submission of specimen other than nasopharyngeal swab, presence of viral mutation(s) within the areas targeted by this assay, and inadequate number of viral copies(<138 copies/mL). A negative result must be combined with clinical observations, patient history, and epidemiological information. The expected result is Negative.  Fact Sheet for Patients:  EntrepreneurPulse.com.au  Fact Sheet for Healthcare Providers:  IncredibleEmployment.be  This test is no t yet approved or cleared by the [place] FDA and  has been authorized for detection and/or diagnosis of SARS-CoV-2 by FDA under an Emergency Use Authorization (EUA). This EUA will remain  in effect (meaning this test can be used) for the duration of the COVID-19 declaration under Section 564(b)(1) of the Act, 21 U.S.C.section 360bbb-3(b)(1), unless the authorization is terminated  or revoked sooner.       Influenza A by PCR NEGATIVE NEGATIVE Final   Influenza B by PCR NEGATIVE NEGATIVE Final    Comment: (NOTE) The Xpert Xpress SARS-CoV-2/FLU/RSV plus assay is intended as an aid in the diagnosis of influenza from Nasopharyngeal swab specimens and should not be used as a sole basis for treatment.  Nasal washings and aspirates are unacceptable for Xpert Xpress SARS-CoV-2/FLU/RSV testing.  Fact Sheet for Patients: EntrepreneurPulse.com.au  Fact Sheet for Healthcare Providers: IncredibleEmployment.be  This test is not yet approved or cleared by the [place] FDA and has been  authorized for detection and/or diagnosis of SARS-CoV-2 by FDA under an Emergency Use Authorization (EUA). This EUA will remain in effect (meaning this test can be used) for the duration of the COVID-19 declaration under Section 564(b)(1) of the Act, 21 U.S.C. section 360bbb-3(b)(1), unless the authorization is terminated or revoked.  Performed at [facility], [address] [place], [place] [address]   Culture, blood (Routine X 2) w Reflex to ID Panel     Status: None   Collection Time: 12/09/20 10:53 PM   Specimen: BLOOD  Result Value Ref Range Status   Specimen Description BLOOD BLOOD LEFT FOREARM  Final   Special Requests   Final    BOTTLES DRAWN AEROBIC AND ANAEROBIC Blood Culture results may not be optimal due to an inadequate volume of blood received in culture bottles   Culture   Final    NO GROWTH 5 DAYS Performed at [facility], [address] [place], [place] [address]    Report Status 12/14/2020 FINAL  Final  Culture, blood (Routine X 2) w Reflex to ID Panel     Status: None   Collection Time: 12/09/20 10:54 PM   Specimen: BLOOD  Result Value Ref Range Status   Specimen Description BLOOD BLOOD LEFT FOREARM  Final   Special Requests   Final    BOTTLES DRAWN AEROBIC AND ANAEROBIC Blood Culture results may not be optimal due to an excessive volume of blood received in culture bottles   Culture   Final    NO GROWTH 5 DAYS Performed at [facility], [address], [place], [place] [address]    Report Status 12/14/2020 FINAL  Final         Radiology Studies: MR BRAIN WO CONTRAST  Result Date:  12/14/2020 CLINICAL DATA:  Neuro deficit, acute, stroke suspected EXAM: MRI HEAD WITHOUT CONTRAST TECHNIQUE: Multiplanar, multiecho pulse sequences of the brain and surrounding structures were obtained without intravenous contrast. COMPARISON:  None. FINDINGS: Please note that some image sequences are degraded by motion artifact. Brain: No diffusion-weighted signal abnormality. No intracranial hemorrhage. No midline shift, ventriculomegaly or extra-axial fluid collection. No mass lesion. Mild cerebral atrophy with ex vacuo dilatation. Moderate chronic microvascular ischemic changes. Vascular: Major intracranial flow voids are proximally preserved. Skull and upper cervical spine: Normal marrow signal. Sinuses/Orbits: Sequela of bilateral lens replacement. Clear paranasal sinuses and mastoid air cells. Other: None. IMPRESSION: No acute intracranial process. Mild cerebral atrophy and moderate chronic microvascular ischemic changes. Electronically Signed   By: Primitivo Gauze M.D.   On: 12/14/2020 14:14   [place] CHEST (PLEURAL EFFUSION)  Result Date: 12/13/2020 CLINICAL DATA:  Suggestion of bilateral pleural effusions by prior chest x-ray. EXAM: CHEST ULTRASOUND COMPARISON:  Chest x-ray on 12/09/2000 FINDINGS: Ultrasound demonstrates a small right pleural effusion and trace left pleural fluid. There was not enough fluid volume to perform thoracentesis on the right. IMPRESSION: Lack of sufficient pleural fluid volume to perform thoracentesis. Small right pleural effusion and trace left pleural effusion present. Electronically Signed   By: Aletta Edouard M.D.   On: 12/13/2020 10:50        Scheduled Meds:  aspirin EC  81 mg Oral Daily   enoxaparin (LOVENOX) injection  40 mg Subcutaneous Q24H   levothyroxine  50 mcg Oral Q0600   simvastatin  40 mg Oral q1800   sodium chloride flush  3 mL Intravenous Q12H   Continuous Infusions:  sodium chloride     dextrose 5 % with KCl 20  mEq / L 20 mEq  (12/14/20 1122)     LOS: 4 days    Time spent: 35 minutes    Edwin Dada, MD [facility] 12/14/2020, 2:48 PM     Please page though [place] or Epic secure chat:  For [employer], Adult nurse

## 2020-12-15 DIAGNOSIS — R1310 Dysphagia, unspecified: Secondary | ICD-10-CM

## 2020-12-15 DIAGNOSIS — R471 Dysarthria and anarthria: Secondary | ICD-10-CM

## 2020-12-15 DIAGNOSIS — G9341 Metabolic encephalopathy: Secondary | ICD-10-CM

## 2020-12-15 LAB — CBC
HCT: 50.9 % (ref 39.0–52.0)
Hemoglobin: 16.1 g/dL (ref 13.0–17.0)
MCH: 28 pg (ref 26.0–34.0)
MCHC: 31.6 g/dL (ref 30.0–36.0)
MCV: 88.4 fL (ref 80.0–100.0)
Platelets: 224 10*3/uL (ref 150–400)
RBC: 5.76 MIL/uL (ref 4.22–5.81)
RDW: 15.5 % (ref 11.5–15.5)
WBC: 11.9 10*3/uL — ABNORMAL HIGH (ref 4.0–10.5)
nRBC: 0 % (ref 0.0–0.2)

## 2020-12-15 LAB — BASIC METABOLIC PANEL
Anion gap: 10 (ref 5–15)
BUN: 52 mg/dL — ABNORMAL HIGH (ref 8–23)
CO2: 29 mmol/L (ref 22–32)
Calcium: 8.9 mg/dL (ref 8.9–10.3)
Chloride: 110 mmol/L (ref 98–111)
Creatinine, Ser: 1.34 mg/dL — ABNORMAL HIGH (ref 0.61–1.24)
GFR, Estimated: 51 mL/min — ABNORMAL LOW (ref >60)
Glucose, Bld: 142 mg/dL — ABNORMAL HIGH (ref 70–99)
Potassium: 4 mmol/L (ref 3.5–5.1)
Sodium: 149 mmol/L — ABNORMAL HIGH (ref 135–145)

## 2020-12-15 LAB — PROCALCITONIN: Procalcitonin: <0.1 ng/mL

## 2020-12-15 NOTE — Progress Notes (Signed)
Reassessed swallowing during treatment today. Report to follow. Dys 1 diet with strict aspiration precautions

## 2020-12-15 NOTE — Progress Notes (Signed)
PROGRESS NOTE    Manuel Reynolds   [id number]  DOB: Feb 09, 1930  DOA: 12/09/2020     5  PCP: [name], DO  CC: SOB  Hospital Course: Mr. Orsburn is an 87 y.o.M with Alzheimer's dementia, lives at home, hypothyroidism, Lymphoma in remission, HTN, CKD IIIa baseline Cr 1.1-1.3 who presented with acute onset hypoxia and dyspnea.    On arrival to the hospital he was found to be hypoxic; CXR showed bilateral opacities and bilateral pleural effusions.  Blood pressure was also uncontrolled and significantly elevated.  He was started on nitroglycerin infusion and Lasix.  He continued to decline in terms of his mentation and developed significant delirium/encephalopathy.  He also developed worsening dysarthria, dysphagia, and underwent stroke work-up.  MRI brain was negative for acute stroke. He initially was not safe for oral intake however with reattempt with SLP, he was graduated to a trial of a dysphagia 1 diet. Goals of care were discussed and per his wife the plan was to continue watching for any improvement over the next couple days and if he does further decline then goal would be to pursue hospice.   Interval History:  Patient was seen resting in bed today with mittens on his hands.  He had severely dysarthric speech and it was difficult to answer his questions and understand what he was wanting.  Ultimately he was trying to find out how his wife was doing and where she was.  His food tray had just come but he had not yet eaten.  He was endorsing wanting some ice cream.  Old records reviewed in assessment of this patient  ROS: Review of systems not obtained due to patient factors. Cognitive impairment   Assessment & Plan:  Acute diastolic CHF - s/p IV lasix and off O2 now -Monitor volume status while on fluids  Dysphagia - No stroke on MRI brain - Evaluated by SLP.  Trial of dysphagia 1 diet on [id number]  Acute metabolic encephalopathy Alzheimer's dementia -  Mentation appears to have worsened since admission.  Likely some component of superimposed hospital-acquired delirium however he seems to have poor reserve and his mentation has suffered - Currently requiring mittens for safety - Stroke has been ruled out on MRI brain - Continue reorienting as needed - Goals of care have been discussed with wife.  If he does further decline, plan will be to pursue hospice  Hypernatremia - Likely due to his poor oral intake - Continue D5 with potassium - Follow BMP  Hypokalemia - Replete and recheck as needed  Acute on chronic kidney disease stage IIIa  - Status post worsening after IV diuresis which has been stopped and he is now on fluids - BMP in a.m.   Antimicrobials: n/a  DVT prophylaxis: Lovenox Code Status: DNR Family Communication: wife Disposition Plan: Status is: Inpatient  Remains inpatient appropriate because:Unsafe d/c plan and Inpatient level of care appropriate due to severity of illness   Dispo: The patient is from: Home              Anticipated d/c is to: pending clinical course              Anticipated d/c date is: 3 days              Patient currently is not medically stable to d/c.   Difficult to place patient No  Objective: Blood pressure (!) 129/91, pulse 92, temperature 97.7 F (36.5 C), resp. rate 18, height 5\' 7"  (1.702  m), weight 73.6 kg, SpO2 96 %.  Examination: General appearance: Chronically ill elderly man laying in bed in no distress but is overtly confused with severe dysarthria Head: Normocephalic, without obvious abnormality, atraumatic Eyes: EOMI Lungs: clear to auscultation bilaterally Heart: regular rate and rhythm and S1, S2 normal Abdomen: normal findings: bowel sounds normal and soft, non-tender Extremities: No edema Skin: mobility and turgor normal Neurologic: Moves all 4 extremities and follows some commands.  Severe dysarthria noted.  Oriented to name.  Consultants:     Procedures:      Data Reviewed: I have personally reviewed following labs and imaging studies Results for orders placed or performed during the hospital encounter of 12/09/20 (from the past 24 hour(s))  CBC     Status: Abnormal   Collection Time: 12/15/20  4:51 AM  Result Value Ref Range   WBC 11.9 (H) 4.0 - 10.5 K/uL   RBC 5.76 4.22 - 5.81 MIL/uL   Hemoglobin 16.1 13.0 - 17.0 g/dL   HCT 50.9 39.0 - 52.0 %   MCV 88.4 80.0 - 100.0 fL   MCH 28.0 26.0 - 34.0 pg   MCHC 31.6 30.0 - 36.0 g/dL   RDW 15.5 11.5 - 15.5 %   Platelets 224 150 - 400 K/uL   nRBC 0.0 0.0 - 0.2 %  Basic metabolic panel     Status: Abnormal   Collection Time: 12/15/20  4:51 AM  Result Value Ref Range   Sodium 149 (H) 135 - 145 mmol/L   Potassium 4.0 3.5 - 5.1 mmol/L   Chloride 110 98 - 111 mmol/L   CO2 29 22 - 32 mmol/L   Glucose, Bld 142 (H) 70 - 99 mg/dL   BUN 52 (H) 8 - 23 mg/dL   Creatinine, Ser 1.34 (H) 0.61 - 1.24 mg/dL   Calcium 8.9 8.9 - 10.3 mg/dL   GFR, Estimated 51 (L) >60 mL/min   Anion gap 10 5 - 15  Procalcitonin - Baseline     Status: None   Collection Time: 12/15/20  4:51 AM  Result Value Ref Range   Procalcitonin <0.10 ng/mL    Recent Results (from the past 240 hour(s))  Resp Panel by RT-PCR (Flu A&B, Covid) Nasopharyngeal Swab     Status: None   Collection Time: 12/09/20 10:18 PM   Specimen: Nasopharyngeal Swab; Nasopharyngeal(NP) swabs in vial transport medium  Result Value Ref Range Status   SARS Coronavirus 2 by RT PCR NEGATIVE NEGATIVE Final    Comment: (NOTE) SARS-CoV-2 target nucleic acids are NOT DETECTED.  The SARS-CoV-2 RNA is generally detectable in upper respiratory specimens during the acute phase of infection. The lowest concentration of SARS-CoV-2 viral copies this assay can detect is 138 copies/mL. A negative result does not preclude SARS-Cov-2 infection and should not be used as the sole basis for treatment or other patient management decisions. A negative result may occur with   improper specimen collection/handling, submission of specimen other than nasopharyngeal swab, presence of viral mutation(s) within the areas targeted by this assay, and inadequate number of viral copies(<138 copies/mL). A negative result must be combined with clinical observations, patient history, and epidemiological information. The expected result is Negative.  Fact Sheet for Patients:  EntrepreneurPulse.com.au  Fact Sheet for Healthcare Providers:  IncredibleEmployment.be  This test is no t yet approved or cleared by the [place] FDA and  has been authorized for detection and/or diagnosis of SARS-CoV-2 by FDA under an Emergency Use Authorization (EUA). This EUA will remain  in effect (meaning this test can be used) for the duration of the COVID-19 declaration under Section 564(b)(1) of the Act, 21 U.S.C.section 360bbb-3(b)(1), unless the authorization is terminated  or revoked sooner.       Influenza A by PCR NEGATIVE NEGATIVE Final   Influenza B by PCR NEGATIVE NEGATIVE Final    Comment: (NOTE) The Xpert Xpress SARS-CoV-2/FLU/RSV plus assay is intended as an aid in the diagnosis of influenza from Nasopharyngeal swab specimens and should not be used as a sole basis for treatment. Nasal washings and aspirates are unacceptable for Xpert Xpress SARS-CoV-2/FLU/RSV testing.  Fact Sheet for Patients: EntrepreneurPulse.com.au  Fact Sheet for Healthcare Providers: IncredibleEmployment.be  This test is not yet approved or cleared by the [place] FDA and has been authorized for detection and/or diagnosis of SARS-CoV-2 by FDA under an Emergency Use Authorization (EUA). This EUA will remain in effect (meaning this test can be used) for the duration of the COVID-19 declaration under Section 564(b)(1) of the Act, 21 U.S.C. section 360bbb-3(b)(1), unless the authorization is terminated  or revoked.  Performed at [facility], [address] [place], [place] [address]   Culture, blood (Routine X 2) w Reflex to ID Panel     Status: None   Collection Time: 12/09/20 10:53 PM   Specimen: BLOOD  Result Value Ref Range Status   Specimen Description BLOOD BLOOD LEFT FOREARM  Final   Special Requests   Final    BOTTLES DRAWN AEROBIC AND ANAEROBIC Blood Culture results may not be optimal due to an inadequate volume of blood received in culture bottles   Culture   Final    NO GROWTH 5 DAYS Performed at [facility], [address] [place], [place] [address]    Report Status 12/14/2020 FINAL  Final  Culture, blood (Routine X 2) w Reflex to ID Panel     Status: None   Collection Time: 12/09/20 10:54 PM   Specimen: BLOOD  Result Value Ref Range Status   Specimen Description BLOOD BLOOD LEFT FOREARM  Final   Special Requests   Final    BOTTLES DRAWN AEROBIC AND ANAEROBIC Blood Culture results may not be optimal due to an excessive volume of blood received in culture bottles   Culture   Final    NO GROWTH 5 DAYS Performed at [facility], [address] [place], [place] [address]    Report Status 12/14/2020 FINAL  Final     Radiology Studies: MR BRAIN WO CONTRAST  Result Date: 12/14/2020 CLINICAL DATA:  Neuro deficit, acute, stroke suspected EXAM: MRI HEAD WITHOUT CONTRAST TECHNIQUE: Multiplanar, multiecho pulse sequences of the brain and surrounding structures were obtained without intravenous contrast. COMPARISON:  None. FINDINGS: Please note that some image sequences are degraded by motion artifact. Brain: No diffusion-weighted signal abnormality. No intracranial hemorrhage. No midline shift, ventriculomegaly or extra-axial fluid collection. No mass lesion. Mild cerebral atrophy with ex vacuo dilatation. Moderate chronic microvascular ischemic changes. Vascular: Major intracranial flow voids are proximally preserved. Skull and upper cervical  spine: Normal marrow signal. Sinuses/Orbits: Sequela of bilateral lens replacement. Clear paranasal sinuses and mastoid air cells. Other: None. IMPRESSION: No acute intracranial process. Mild cerebral atrophy and moderate chronic microvascular ischemic changes. Electronically Signed   By: Primitivo Gauze M.D.   On: 12/14/2020 14:14   MR BRAIN WO CONTRAST  Final Result    [place] CHEST (PLEURAL EFFUSION)  Final Result    DG Chest Portable 1 View  Final Result  Scheduled Meds: . aspirin EC  81 mg Oral Daily  . enoxaparin (LOVENOX) injection  40 mg Subcutaneous Q24H  . levothyroxine  50 mcg Oral Q0600  . simvastatin  40 mg Oral q1800  . sodium chloride flush  3 mL Intravenous Q12H   PRN Meds: sodium chloride, acetaminophen, hydrALAZINE, ondansetron (ZOFRAN) IV, sodium chloride flush Continuous Infusions: . sodium chloride    . dextrose 5 % with KCl 20 mEq / L 20 mEq (12/15/20 1245)     LOS: 5 days  Time spent: Greater than 50% of the 35 minute visit was spent in counseling/coordination of care for the patient as laid out in the A&P.   Dwyane Dee, MD [facility] 12/15/2020, 3:33 PM

## 2020-12-15 NOTE — Progress Notes (Signed)
  Speech Language Pathology Treatment: Dysphagia  Patient Details Name: Manuel Reynolds MRN: [id number] DOB: 1930-09-15 Today's Date: 12/15/2020 Time: 1130-1200 SLP Time Calculation (min) (ACUTE ONLY): 30 min  Assessment / Plan / Recommendation Clinical Impression  Pt visited today for ongoing dysphagia treatment in hopes of being able to start Po's (currently NPO, see BSE 1/21). Today, Pt awakened easily and allowed oral care several times prior to Po trials. After oral care, pt tolerated 3 small pieces of ice chips with no s/s of aspiration. When asked if he wanted something to eat, pt stated "they haven't fed me in 3 days" Pt took several bites of applesauce and swallowed without s/s of aspiration. Noted oral transit delay and mild oral residue. Vocal quality remained clear. Pt stated " I want COLD COLD water a few times during the treatment session. Noted throat clear after nectar thick liquid trials likely indicating laryngeal penetration. Pt was given one small sip of cold water at the end of the session but presented with immediate coughing. When asked if he was OK, Pt stated "maybe it was too big" Much more interaction today. Secure chat to MD and Dysphagia 1 diet with honey thick liquids started. Discussed with Nsg, precautions posted above bed. Rec Pt be allowed ice chips after good oral care. Stop feeding with any s/s of aspiration. ST to follow and alter diet as appropriate.   HPI HPI: Patient is an 87 year old male whose wife reports patient c/o of shortness of breath while eating dinner, could hear crackling noises coming from his chest. She called EMS. EMS noted 16% on RA, complicated by agitation with patient pulling off bipap. Arrived to ED on NRB 15L/min sat 95% PMh includes Dementia, alzheimers, anxiety, hypothyroidism, lymphoma in remission, BLE edema on prm lasix, CKD stage III.  CXR: "Features suggestive of CHF/volume overload with pulmonary edema  and bilateral effusions. 2. More patchy  and coalescent opacities could reflect developing  alveolar edema though underlying infection is difficult to exclude.".  Staff have noted pt's difficulty w/ oral intake; lack of intake in past 2 days.      SLP Plan  Continue with current plan of care       Recommendations  Diet recommendations: Dysphagia 1 (puree);Honey-thick liquid;Other(comment) (Oral care and ice chips) Medication Administration: Crushed with puree Compensations: Minimize environmental distractions;Slow rate;Small sips/bites Postural Changes and/or Swallow Maneuvers: Seated upright 90 degrees;Upright 30-60 min after meal                Oral Care Recommendations: Oral care QID;Staff/trained caregiver to provide oral care SLP Visit Diagnosis: Dysphagia, oropharyngeal phase (R13.12) Plan: Continue with current plan of care       GO                Lucila Maine 12/15/2020, 1:18 PM

## 2020-12-16 LAB — BASIC METABOLIC PANEL
Anion gap: 9 (ref 5–15)
BUN: 32 mg/dL — ABNORMAL HIGH (ref 8–23)
CO2: 28 mmol/L (ref 22–32)
Calcium: 8.6 mg/dL — ABNORMAL LOW (ref 8.9–10.3)
Chloride: 105 mmol/L (ref 98–111)
Creatinine, Ser: 1.13 mg/dL (ref 0.61–1.24)
GFR, Estimated: >60 mL/min (ref >60)
Glucose, Bld: 97 mg/dL (ref 70–99)
Potassium: 3.6 mmol/L (ref 3.5–5.1)
Sodium: 142 mmol/L (ref 135–145)

## 2020-12-16 LAB — MAGNESIUM: Magnesium: 2.2 mg/dL (ref 1.7–2.4)

## 2020-12-16 MED ORDER — QUETIAPINE FUMARATE 25 MG PO TABS
25.0000 mg | ORAL_TABLET | Freq: Once | ORAL | Status: AC
  Administered 2020-12-16: 25 mg via ORAL
  Filled 2020-12-16: qty 1

## 2020-12-16 NOTE — Progress Notes (Signed)
Patient has been without mittens since start of shift no issues at this time. Patient calm and resting.

## 2020-12-16 NOTE — Progress Notes (Signed)
Patient took very small bites of breakfast. Glycerin swab oral care after breakfast. Removed hand mitts for 15 minutes while RN present with patient. Manuel Reynolds

## 2020-12-16 NOTE — Progress Notes (Signed)
PROGRESS NOTE    Manuel Reynolds   [id number]  DOB: 1930/02/12  DOA: 12/09/2020     6  PCP: [name], DO  CC: SOB  Hospital Course: Manuel Reynolds is an 87 y.o.M with Alzheimer's dementia, lives at home, hypothyroidism, Lymphoma in remission, HTN, CKD IIIa baseline Cr 1.1-1.3 who presented with acute onset hypoxia and dyspnea.    On arrival to the hospital he was found to be hypoxic; CXR showed bilateral opacities and bilateral pleural effusions.  Blood pressure was also uncontrolled and significantly elevated.  He was started on nitroglycerin infusion and Lasix.  He continued to decline in terms of his mentation and developed significant delirium/encephalopathy.  He also developed worsening dysarthria, dysphagia, and underwent stroke work-up.  MRI brain was negative for acute stroke. He initially was not safe for oral intake however with reattempt with SLP, he was graduated to a trial of a dysphagia 1 diet. Goals of care were discussed and per his wife the plan was to continue watching for any improvement over the next couple days and if he does further decline then goal would be to pursue hospice.   Interval History:  Not much change or improvement since yesterday.  Only took very small bites of breakfast this morning.  He remains severely deconditioned with very difficult to understand speech that is very dysarthric in nature. Hands remain in mittens as well for patient safety.  Old records reviewed in assessment of this patient  ROS: Review of systems not obtained due to patient factors. Cognitive impairment   Assessment & Plan:  Acute diastolic CHF - s/p IV lasix and off O2 now -Monitor volume status while on fluids  Dysphagia - No stroke on MRI brain - Evaluated by SLP.  Trial of dysphagia 1 diet on 4/74/2595  Acute metabolic encephalopathy Alzheimer's dementia - Mentation appears to have worsened since admission.  Likely some component of superimposed  hospital-acquired delirium however he seems to have poor reserve and his mentation has suffered - Currently requiring mittens for safety - Stroke has been ruled out on MRI brain - Continue reorienting as needed - Goals of care have been discussed with wife.  If he does further decline, plan will be to pursue hospice - as of 1/23, appears he is not improving; requires mittens, not eating much, and just overall not thriving; will discuss with staff and wife regarding next steps  Hypernatremia, resolved - Likely due to his poor oral intake - d/c IVF; if recurs, likely supports that hospice is more appropriate route - Follow BMP  Hypokalemia - Replete and recheck as needed  Acute on chronic kidney disease stage IIIa  - Status post worsening after IV diuresis which has been stopped and he is now on fluids - BMP in a.m.   Antimicrobials: n/a  DVT prophylaxis: Lovenox Code Status: DNR Family Communication: wife Disposition Plan: Status is: Inpatient  Remains inpatient appropriate because:Unsafe d/c plan and Inpatient level of care appropriate due to severity of illness   Dispo: The patient is from: Home              Anticipated d/c is to: pending clinical course              Anticipated d/c date is: 3 days              Patient currently is not medically stable to d/c.   Difficult to place patient No  Objective: Blood pressure 102/65, pulse 72, temperature (!) 97.2  F (36.2 C), resp. rate 16, height 5\' 7"  (1.702 m), weight 60.5 kg, SpO2 100 %.  Examination: General appearance: Chronically ill elderly man laying in bed in no distress but is overtly confused with severe dysarthria Head: Normocephalic, without obvious abnormality, atraumatic Eyes: EOMI Lungs: clear to auscultation bilaterally Heart: regular rate and rhythm and S1, S2 normal Abdomen: normal findings: bowel sounds normal and soft, non-tender Extremities: No edema Skin: mobility and turgor normal Neurologic: Moves  all 4 extremities and follows some commands.  Severe dysarthria noted.  Consultants:     Procedures:     Data Reviewed: I have personally reviewed following labs and imaging studies Results for orders placed or performed during the hospital encounter of 12/09/20 (from the past 24 hour(s))  Basic metabolic panel     Status: Abnormal   Collection Time: 12/16/20  5:17 AM  Result Value Ref Range   Sodium 142 135 - 145 mmol/L   Potassium 3.6 3.5 - 5.1 mmol/L   Chloride 105 98 - 111 mmol/L   CO2 28 22 - 32 mmol/L   Glucose, Bld 97 70 - 99 mg/dL   BUN 32 (H) 8 - 23 mg/dL   Creatinine, Ser 1.13 0.61 - 1.24 mg/dL   Calcium 8.6 (L) 8.9 - 10.3 mg/dL   GFR, Estimated >60 >60 mL/min   Anion gap 9 5 - 15  Magnesium     Status: None   Collection Time: 12/16/20  5:17 AM  Result Value Ref Range   Magnesium 2.2 1.7 - 2.4 mg/dL    Recent Results (from the past 240 hour(s))  Resp Panel by RT-PCR (Flu A&B, Covid) Nasopharyngeal Swab     Status: None   Collection Time: 12/09/20 10:18 PM   Specimen: Nasopharyngeal Swab; Nasopharyngeal(NP) swabs in vial transport medium  Result Value Ref Range Status   SARS Coronavirus 2 by RT PCR NEGATIVE NEGATIVE Final    Comment: (NOTE) SARS-CoV-2 target nucleic acids are NOT DETECTED.  The SARS-CoV-2 RNA is generally detectable in upper respiratory specimens during the acute phase of infection. The lowest concentration of SARS-CoV-2 viral copies this assay can detect is 138 copies/mL. A negative result does not preclude SARS-Cov-2 infection and should not be used as the sole basis for treatment or other patient management decisions. A negative result may occur with  improper specimen collection/handling, submission of specimen other than nasopharyngeal swab, presence of viral mutation(s) within the areas targeted by this assay, and inadequate number of viral copies(<138 copies/mL). A negative result must be combined with clinical observations, patient  history, and epidemiological information. The expected result is Negative.  Fact Sheet for Patients:  EntrepreneurPulse.com.au  Fact Sheet for Healthcare Providers:  IncredibleEmployment.be  This test is no t yet approved or cleared by the [place] FDA and  has been authorized for detection and/or diagnosis of SARS-CoV-2 by FDA under an Emergency Use Authorization (EUA). This EUA will remain  in effect (meaning this test can be used) for the duration of the COVID-19 declaration under Section 564(b)(1) of the Act, 21 U.S.C.section 360bbb-3(b)(1), unless the authorization is terminated  or revoked sooner.       Influenza A by PCR NEGATIVE NEGATIVE Final   Influenza B by PCR NEGATIVE NEGATIVE Final    Comment: (NOTE) The Xpert Xpress SARS-CoV-2/FLU/RSV plus assay is intended as an aid in the diagnosis of influenza from Nasopharyngeal swab specimens and should not be used as a sole basis for treatment. Nasal washings and aspirates are unacceptable for  Xpert Xpress SARS-CoV-2/FLU/RSV testing.  Fact Sheet for Patients: EntrepreneurPulse.com.au  Fact Sheet for Healthcare Providers: IncredibleEmployment.be  This test is not yet approved or cleared by the [place] FDA and has been authorized for detection and/or diagnosis of SARS-CoV-2 by FDA under an Emergency Use Authorization (EUA). This EUA will remain in effect (meaning this test can be used) for the duration of the COVID-19 declaration under Section 564(b)(1) of the Act, 21 U.S.C. section 360bbb-3(b)(1), unless the authorization is terminated or revoked.  Performed at [facility], [address] [place], [place] [address]   Culture, blood (Routine X 2) w Reflex to ID Panel     Status: None   Collection Time: 12/09/20 10:53 PM   Specimen: BLOOD  Result Value Ref Range Status   Specimen Description BLOOD BLOOD LEFT FOREARM  Final    Special Requests   Final    BOTTLES DRAWN AEROBIC AND ANAEROBIC Blood Culture results may not be optimal due to an inadequate volume of blood received in culture bottles   Culture   Final    NO GROWTH 5 DAYS Performed at [facility], [address] [place], [place] [address]    Report Status 12/14/2020 FINAL  Final  Culture, blood (Routine X 2) w Reflex to ID Panel     Status: None   Collection Time: 12/09/20 10:54 PM   Specimen: BLOOD  Result Value Ref Range Status   Specimen Description BLOOD BLOOD LEFT FOREARM  Final   Special Requests   Final    BOTTLES DRAWN AEROBIC AND ANAEROBIC Blood Culture results may not be optimal due to an excessive volume of blood received in culture bottles   Culture   Final    NO GROWTH 5 DAYS Performed at [facility], [address] [place], [place] [address]    Report Status 12/14/2020 FINAL  Final     Radiology Studies: MR BRAIN WO CONTRAST  Result Date: 12/14/2020 CLINICAL DATA:  Neuro deficit, acute, stroke suspected EXAM: MRI HEAD WITHOUT CONTRAST TECHNIQUE: Multiplanar, multiecho pulse sequences of the brain and surrounding structures were obtained without intravenous contrast. COMPARISON:  None. FINDINGS: Please note that some image sequences are degraded by motion artifact. Brain: No diffusion-weighted signal abnormality. No intracranial hemorrhage. No midline shift, ventriculomegaly or extra-axial fluid collection. No mass lesion. Mild cerebral atrophy with ex vacuo dilatation. Moderate chronic microvascular ischemic changes. Vascular: Major intracranial flow voids are proximally preserved. Skull and upper cervical spine: Normal marrow signal. Sinuses/Orbits: Sequela of bilateral lens replacement. Clear paranasal sinuses and mastoid air cells. Other: None. IMPRESSION: No acute intracranial process. Mild cerebral atrophy and moderate chronic microvascular ischemic changes. Electronically Signed   By: Primitivo Gauze M.D.    On: 12/14/2020 14:14   MR BRAIN WO CONTRAST  Final Result    [place] CHEST (PLEURAL EFFUSION)  Final Result    DG Chest Portable 1 View  Final Result      Scheduled Meds: . aspirin EC  81 mg Oral Daily  . enoxaparin (LOVENOX) injection  40 mg Subcutaneous Q24H  . levothyroxine  50 mcg Oral Q0600  . sodium chloride flush  3 mL Intravenous Q12H   PRN Meds: sodium chloride, acetaminophen, hydrALAZINE, ondansetron (ZOFRAN) IV, sodium chloride flush Continuous Infusions: . sodium chloride       LOS: 6 days  Time spent: Greater than 50% of the 35 minute visit was spent in counseling/coordination of care for the patient as laid out in the A&P.  Dwyane Dee, MD [facility] 12/16/2020, 11:34 AM

## 2020-12-17 DIAGNOSIS — Z66 Do not resuscitate: Secondary | ICD-10-CM

## 2020-12-17 DIAGNOSIS — Z515 Encounter for palliative care: Secondary | ICD-10-CM

## 2020-12-17 DIAGNOSIS — R627 Adult failure to thrive: Secondary | ICD-10-CM

## 2020-12-17 DIAGNOSIS — F0280 Dementia in other diseases classified elsewhere without behavioral disturbance: Secondary | ICD-10-CM

## 2020-12-17 DIAGNOSIS — R531 Weakness: Secondary | ICD-10-CM

## 2020-12-17 DIAGNOSIS — R1310 Dysphagia, unspecified: Secondary | ICD-10-CM

## 2020-12-17 DIAGNOSIS — J9601 Acute respiratory failure with hypoxia: Secondary | ICD-10-CM

## 2020-12-17 DIAGNOSIS — Z7189 Other specified counseling: Secondary | ICD-10-CM

## 2020-12-17 DIAGNOSIS — G309 Alzheimer's disease, unspecified: Secondary | ICD-10-CM

## 2020-12-17 LAB — BASIC METABOLIC PANEL
Anion gap: 10 (ref 5–15)
BUN: 35 mg/dL — ABNORMAL HIGH (ref 8–23)
CO2: 27 mmol/L (ref 22–32)
Calcium: 8.4 mg/dL — ABNORMAL LOW (ref 8.9–10.3)
Chloride: 107 mmol/L (ref 98–111)
Creatinine, Ser: 1.3 mg/dL — ABNORMAL HIGH (ref 0.61–1.24)
GFR, Estimated: 53 mL/min — ABNORMAL LOW (ref >60)
Glucose, Bld: 91 mg/dL (ref 70–99)
Potassium: 3.9 mmol/L (ref 3.5–5.1)
Sodium: 144 mmol/L (ref 135–145)

## 2020-12-17 LAB — MAGNESIUM: Magnesium: 2.2 mg/dL (ref 1.7–2.4)

## 2020-12-17 NOTE — Progress Notes (Signed)
PROGRESS NOTE    Manuel Reynolds   SWF:[id number]  DOB: Dec 14, 1929  DOA: 12/09/2020     7  PCP: [name], DO  CC: SOB  Hospital Course: [name] is an 87 y.o.M with Alzheimer's dementia, lives at home, hypothyroidism, Lymphoma in remission, HTN, CKD IIIa baseline Cr 1.1-1.3 who presented with acute onset hypoxia and dyspnea.    On arrival to the hospital he was found to be hypoxic; CXR showed bilateral opacities and bilateral pleural effusions.  Blood pressure was also uncontrolled and significantly elevated.  He was started on nitroglycerin infusion and Lasix.  He continued to decline in terms of his mentation and developed significant delirium/encephalopathy.  He also developed worsening dysarthria, dysphagia, and underwent stroke work-up.  MRI brain was negative for acute stroke. He initially was not safe for oral intake however with reattempt with SLP, he was graduated to a trial of a dysphagia 1 diet. Goals of care were discussed and per his wife the plan was to continue watching for any improvement over the next couple days and if he does further decline then goal would be to pursue hospice.   Interval History:  No change since yesterday.  Even more lethargic today and has barely eaten any food over the last couple days.  Seems more hospice appropriate at this point.  Discussed with palliative care who plans to see later today and begin goals of care discussions with wife further.  Old records reviewed in assessment of this patient  ROS: Review of systems not obtained due to patient factors. Cognitive impairment   Assessment & Plan:  Acute diastolic CHF - s/p IV lasix and off O2 now -Monitor volume status while on fluids  Dysphagia - No stroke on MRI brain - Evaluated by SLP.  Trial of dysphagia 1 diet on 01/13/2541  Acute metabolic encephalopathy Alzheimer's dementia - Mentation appears to have worsened since admission.  Likely some component of  superimposed hospital-acquired delirium however he seems to have poor reserve and his mentation has suffered - Currently requiring mittens for safety - Stroke has been ruled out on MRI brain - Continue reorienting as needed - continues to do poorly and not thriving well nor eating much; does appear to be appropriate for hospice consideration at this time; will ask for assistance with PCM to guide discussions with wife for either home with hospice vs residential hospice   Hypernatremia, resolved - Likely due to his poor oral intake - d/c IVF; if recurs, likely supports that hospice is more appropriate route - Follow BMP  Hypokalemia - Replete and recheck as needed  Acute on chronic kidney disease stage IIIa  - Status post worsening after IV diuresis which has been stopped and he is now on fluids - BMP in a.m.   Antimicrobials: n/a  DVT prophylaxis: Lovenox Code Status: DNR Family Communication: wife Disposition Plan: Status is: Inpatient  Remains inpatient appropriate because:Unsafe d/c plan and Inpatient level of care appropriate due to severity of illness   Dispo: The patient is from: Home              Anticipated d/c is to: pending clinical course              Anticipated d/c date is: 3 days              Patient currently is not medically stable to d/c.   Difficult to place patient No  Objective: Blood pressure 120/72, pulse 92, temperature 98.2 F (36.8  C), temperature source Oral, resp. rate 18, height 5\' 7"  (1.702 m), weight 68.9 kg, SpO2 93 %.  Examination: General appearance: Very lethargic this morning and even more difficulty to arouse him and get him to try and follow commands or talk.  He did follow some commands but definitely was less vocal than he has been the past 2 days Head: Normocephalic, without obvious abnormality, atraumatic Eyes: EOMI Lungs: clear to auscultation bilaterally Heart: regular rate and rhythm and S1, S2 normal Abdomen: normal findings:  bowel sounds normal and soft, non-tender Extremities: No edema Skin: mobility and turgor normal Neurologic: Moves all 4 extremities minimally due to severe weakness  Consultants:   Palliative care medicine  Procedures:     Data Reviewed: I have personally reviewed following labs and imaging studies Results for orders placed or performed during the hospital encounter of 12/09/20 (from the past 24 hour(s))  Basic metabolic panel     Status: Abnormal   Collection Time: 12/17/20  2:24 AM  Result Value Ref Range   Sodium 144 135 - 145 mmol/L   Potassium 3.9 3.5 - 5.1 mmol/L   Chloride 107 98 - 111 mmol/L   CO2 27 22 - 32 mmol/L   Glucose, Bld 91 70 - 99 mg/dL   BUN 35 (H) 8 - 23 mg/dL   Creatinine, Ser 1.30 (H) 0.61 - 1.24 mg/dL   Calcium 8.4 (L) 8.9 - 10.3 mg/dL   GFR, Estimated 53 (L) >60 mL/min   Anion gap 10 5 - 15  Magnesium     Status: None   Collection Time: 12/17/20  2:24 AM  Result Value Ref Range   Magnesium 2.2 1.7 - 2.4 mg/dL    Recent Results (from the past 240 hour(s))  Resp Panel by RT-PCR (Flu A&B, Covid) Nasopharyngeal Swab     Status: None   Collection Time: 12/09/20 10:18 PM   Specimen: Nasopharyngeal Swab; Nasopharyngeal(NP) swabs in vial transport medium  Result Value Ref Range Status   SARS Coronavirus 2 by RT PCR NEGATIVE NEGATIVE Final    Comment: (NOTE) SARS-CoV-2 target nucleic acids are NOT DETECTED.  The SARS-CoV-2 RNA is generally detectable in upper respiratory specimens during the acute phase of infection. The lowest concentration of SARS-CoV-2 viral copies this assay can detect is 138 copies/mL. A negative result does not preclude SARS-Cov-2 infection and should not be used as the sole basis for treatment or other patient management decisions. A negative result may occur with  improper specimen collection/handling, submission of specimen other than nasopharyngeal swab, presence of viral mutation(s) within the areas targeted by this assay,  and inadequate number of viral copies(<138 copies/mL). A negative result must be combined with clinical observations, patient history, and epidemiological information. The expected result is Negative.  Fact Sheet for Patients:  EntrepreneurPulse.com.au  Fact Sheet for Healthcare Providers:  IncredibleEmployment.be  This test is no t yet approved or cleared by the [place] FDA and  has been authorized for detection and/or diagnosis of SARS-CoV-2 by FDA under an Emergency Use Authorization (EUA). This EUA will remain  in effect (meaning this test can be used) for the duration of the COVID-19 declaration under Section 564(b)(1) of the Act, 21 U.S.C.section 360bbb-3(b)(1), unless the authorization is terminated  or revoked sooner.       Influenza A by PCR NEGATIVE NEGATIVE Final   Influenza B by PCR NEGATIVE NEGATIVE Final    Comment: (NOTE) The Xpert Xpress SARS-CoV-2/FLU/RSV plus assay is intended as an aid in the  diagnosis of influenza from Nasopharyngeal swab specimens and should not be used as a sole basis for treatment. Nasal washings and aspirates are unacceptable for Xpert Xpress SARS-CoV-2/FLU/RSV testing.  Fact Sheet for Patients: EntrepreneurPulse.com.au  Fact Sheet for Healthcare Providers: IncredibleEmployment.be  This test is not yet approved or cleared by the [place] FDA and has been authorized for detection and/or diagnosis of SARS-CoV-2 by FDA under an Emergency Use Authorization (EUA). This EUA will remain in effect (meaning this test can be used) for the duration of the COVID-19 declaration under Section 564(b)(1) of the Act, 21 U.S.C. section 360bbb-3(b)(1), unless the authorization is terminated or revoked.  Performed at [facility], [address] [place], [place] [address]   Culture, blood (Routine X 2) w Reflex to ID Panel     Status: None   Collection Time:  12/09/20 10:53 PM   Specimen: BLOOD  Result Value Ref Range Status   Specimen Description BLOOD BLOOD LEFT FOREARM  Final   Special Requests   Final    BOTTLES DRAWN AEROBIC AND ANAEROBIC Blood Culture results may not be optimal due to an inadequate volume of blood received in culture bottles   Culture   Final    NO GROWTH 5 DAYS Performed at [facility], [address] [place], [place] [address]    Report Status 12/14/2020 FINAL  Final  Culture, blood (Routine X 2) w Reflex to ID Panel     Status: None   Collection Time: 12/09/20 10:54 PM   Specimen: BLOOD  Result Value Ref Range Status   Specimen Description BLOOD BLOOD LEFT FOREARM  Final   Special Requests   Final    BOTTLES DRAWN AEROBIC AND ANAEROBIC Blood Culture results may not be optimal due to an excessive volume of blood received in culture bottles   Culture   Final    NO GROWTH 5 DAYS Performed at [facility], [address], [place],  [address]    Report Status 12/14/2020 FINAL  Final     Radiology Studies: No results found. MR BRAIN WO CONTRAST  Final Result    [place] CHEST (PLEURAL EFFUSION)  Final Result    DG Chest Portable 1 View  Final Result      Scheduled Meds: . aspirin EC  81 mg Oral Daily  . enoxaparin (LOVENOX) injection  40 mg Subcutaneous Q24H  . levothyroxine  50 mcg Oral Q0600  . sodium chloride flush  3 mL Intravenous Q12H   PRN Meds: sodium chloride, acetaminophen, hydrALAZINE, ondansetron (ZOFRAN) IV, sodium chloride flush Continuous Infusions: . sodium chloride       LOS: 7 days  Time spent: Greater than 50% of the 35 minute visit was spent in counseling/coordination of care for the patient as laid out in the A&P.   Dwyane Dee, MD [facility] 12/17/2020, 12:45 PM

## 2020-12-17 NOTE — Progress Notes (Signed)
Speech Language Pathology Treatment: Dysphagia  Patient Details Name: Manuel Reynolds MRN: [id number] DOB: February 10, 1930 Today's Date: 12/17/2020 Time: 2542-7062 SLP Time Calculation (min) (ACUTE ONLY): 40 min  Assessment / Plan / Recommendation Clinical Impression  Pt seen for ongoing assessment of swallowing; toleration of Dysphagia diet initiated: puree, and Honey consistency liquids. Pt appears much improved since initial evaluation; more engaged and responded verbally to questions. He follows basic instructions w/ cues; appears HOH. He has baseline Dementia.  Pt appears to present w/ oropharyngeal phase dysphagia in light of illness and declined Cognitive status; Dementia. This impacts his overall awareness/engagement and safety during po tasks which increases risk for aspiration, choking. Pt's risk for aspiration, as well as concern of meeting nutritional needs fully, is present, but is reduced when following general aspiration precautions, given feeding assistance, and using a Modified diet. He requires Mod tactile/verbal/ visual cues for orientation to bolus presentation, and during feeding support -- he was able to Hold the Cup for drinking of Nectar liquids. He needed visual and tactile cues of spoon at lips for other po trials intermittently but appeared to do best when drinking and Holding the Cup(more neuro input). Mild+ discordinated UE movements noted; weakness. Pt consumed trials of Nectar liquids via tsp then Cup and purees w/ No immediate, overt clinical s/s of aspiration noted; no decline in respiratory status during/post trials; No increased respiratory effort noted post trials; vocal quality clear/low.  Oral phase was c/b min reduced oral prep awareness to accept and manage boluses but then followed by adequate oral phase management and adequate A-P transfer and swallow; oral clearing achieved w/ boluses given Time w/ each trial.  D/t pt's declined Cognitive status and his risk for  aspiration, recommend continuing a dysphagia level 1(puree) diet for foods but w/ upgrade to Nectar liquids via Cup w/ pt helping to Hold to Drink; aspiration precautions; reduce Distractions during meals and only give po's when pt is alert, participative. Pills Crushed in Puree for safer swallowing. Support w/ feeding at meals -- check for oral clearing during/post intake. NSG updated. ST services recommends follow w/ Palliative Care for [place]. ST services feels the Nectar liquids could be the most beneficial for pt d/t risk for aspiration, but will f/u w/ trials to upgrade foods in diet(d/t baseline Dementia). Largely suspect that pt's Dementia could hamper oral intake and safe upgrade of diet. Precautions posted in room. Wife given education on aspiration precautions; dysphagia; aspiration/pneumonia; food and liquid consistencies for safer oral intake at this time. MD/NSG/Dietician updated.    HPI HPI: Patient is an 87 year old male whose wife reports patient c/o of shortness of breath while eating dinner, could hear crackling noises coming from his chest. She called EMS. EMS noted 37% on RA, complicated by agitation with patient pulling off bipap. Arrived to ED on NRB 15L/min sat 95%.   PMH includes:  Dementia, alzheimers, anxiety, hypothyroidism, lymphoma in remission, BLE edema on prm lasix, CKD stage III. HOH.   CXR: "Features suggestive of CHF/volume overload with pulmonary edema  and bilateral effusions. 2. More patchy and coalescent opacities could reflect developing  alveolar edema though underlying infection is difficult to exclude.".  Staff have noted pt's difficulty w/ oral intake; lack of intake in past 2 days.      SLP Plan  Continue with current plan of care       Recommendations  Diet recommendations: Dysphagia 1 (puree);Nectar-thick liquid Liquids provided via: Cup;No straw Medication Administration: Whole meds with puree (vs  Crushed in Puree) Supervision: Patient able to self  feed;Staff to assist with self feeding;Full supervision/cueing for compensatory strategies Compensations: Minimize environmental distractions;Slow rate;Small sips/bites;Lingual sweep for clearance of pocketing;Follow solids with liquid Postural Changes and/or Swallow Maneuvers: Seated upright 90 degrees;Upright 30-60 min after meal                General recommendations:  (Dietician f/u; Palliative Care for [place]) Oral Care Recommendations: Oral care BID;Oral care before and after PO;Staff/trained caregiver to provide oral care Follow up Recommendations: Skilled Nursing facility (TBD) SLP Visit Diagnosis: Dysphagia, oropharyngeal phase (R13.12) (baseline Dementia) Plan: Continue with current plan of care       GO                 Orinda Kenner, MS, CCC-SLP Speech Language Pathologist Rehab Services [phone] [facility] 12/17/2020, 5:33 PM

## 2020-12-17 NOTE — Progress Notes (Signed)
Occupational Therapy Treatment Patient Details Name: Manuel Reynolds MRN: [id number] DOB: 11/06/1930 Today's Date: 12/17/2020    History of present illness Patient is an 87 year old male whose wife reports patient c/o of shortness of breath while eating dinner, could hear crackling noises coming from his chest. She called EMS. EMS noted 57% on RA, complicated by agitation with patient pulling off bipap. Arrived to ED on NRB 15L/min sat 95% PMh includes dementia, alzheimers, anxiety, hypothyroidism, lymphoma in remission, BLE edema on prm lasix, CKD stage III.   OT comments  Manuel Reynolds seen for OT/PT co-treatment on this date. Upon arrival to room pt reclined in bed with wife at bed side.  Pt agreeable to treatment, A&O x1 self only. MAX A x2 don/doff brief rolling at bed level. MAX A x2 bed mobility. CGA + MIN A self-drinking seated EOB. MAX A don/doff B socks seated EOB - assist for sequencing and seated balance. Pt making good progress toward goals. Pt continues to benefit from skilled OT services to maximize return to PLOF and minimize risk of future falls, injury, caregiver burden, and readmission. Will continue to follow POC. Discharge recommendation remains appropriate.    Follow Up Recommendations  SNF    Equipment Recommendations  Other (comment) (TBD)    Recommendations for Other Services      Precautions / Restrictions Precautions Precautions: Fall Restrictions Weight Bearing Restrictions: No       Mobility Bed Mobility Overal bed mobility: Needs Assistance Bed Mobility: Supine to Sit;Sit to Supine;Rolling Rolling: Max assist   Supine to sit: Max assist;+2 for physical assistance;HOB elevated Sit to supine: Max assist;+2 for physical assistance;HOB elevated   General bed mobility comments: once in sidelying pt able to maintain CGA  Transfers Overall transfer level: Needs assistance Equipment used: 2 person hand held assist;Rolling walker (2 wheeled) Transfers: Sit  to/from Stand Sit to Stand: Max assist;+2 physical assistance;From elevated surface         General transfer comment: Pt requires MAX +2 for sit>stand as pt with strong posterior lean/pushing back on EOB with BLE. Requires manual facilitation for anterior pelvic shift & upright posture.    Balance Overall balance assessment: Needs assistance Sitting-balance support: Feet supported;No upper extremity supported Sitting balance-Leahy Scale: Fair Sitting balance - Comments: close supervision<>Min assist for static sitting EOB. Pt with posterior lean & engages in reaching forward to give PT high five to focus on anterior shifting. Pt eventually able to anteriorly shift & rest B elbows on knees. Postural control: Posterior lean Standing balance support: Bilateral upper extremity supported;During functional activity Standing balance-Leahy Scale: Zero Standing balance comment: Max assist +2 for static standing balance but can fade to as little as min assist +2. Posterior lean.               High Level Balance Comments: Pt able to engage in marching standing EOB with max assist +2 HHA.           ADL either performed or assessed with clinical judgement   ADL Overall ADL's : Needs assistance/impaired                                       General ADL Comments: CGA + MIN A self-drinking seated EOB. MAX A don/doff B socks seated EOB - assist for sequencing and seated balance. MAX A x2 don/doff brief rolling at bed level  Cognition Arousal/Alertness: Awake/alert Behavior During Therapy: Flat affect Overall Cognitive Status: Impaired/Different from baseline Area of Impairment: Orientation;Awareness;Memory;Attention;Following commands;Problem solving;Safety/judgement                 Orientation Level: Disoriented to;Place;Time;Situation Current Attention Level: Focused Memory: Decreased recall of precautions;Decreased short-term memory Following  Commands: Follows one step commands inconsistently;Follows one step commands with increased time Safety/Judgement: Decreased awareness of safety;Decreased awareness of deficits Awareness: Intellectual Problem Solving: Slow processing;Difficulty sequencing;Requires verbal cues;Requires tactile cues General Comments: Pleasantly confused, poor command following        Exercises Exercises: Other exercises Other Exercises Other Exercises: Pt and family edcuated re: OT role, DME recs, d/c recs, falls prevention Other Exercises: LBD, toileting, sup<>sit, rolling, sit<>stand, sitting/standing balance/tolerance, self-drinking   Shoulder Instructions       General Comments No increased WOB noted    Pertinent Vitals/ Pain       Pain Assessment: Faces Faces Pain Scale: No hurt         Frequency  Min 1X/week        Progress Toward Goals  OT Goals(current goals can now be found in the care plan section)  Progress towards OT goals: Progressing toward goals  Acute Rehab OT Goals Patient Stated Goal: patient unable to state OT Goal Formulation: With patient Time For Goal Achievement: 12/25/20 Potential to Achieve Goals: Poor ADL Goals Pt Will Perform Grooming: with supervision;standing Pt Will Perform Upper Body Dressing: with supervision;standing Pt Will Perform Lower Body Dressing: with supervision;sit to/from stand Pt Will Transfer to Toilet: with supervision;ambulating Pt Will Perform Toileting - Clothing Manipulation and hygiene: with supervision;sit to/from stand  Plan Discharge plan remains appropriate;Frequency remains appropriate    Co-evaluation    PT/OT/SLP Co-Evaluation/Treatment: Yes Reason for Co-Treatment: Necessary to address cognition/behavior during functional activity;For patient/therapist safety;To address functional/ADL transfers PT goals addressed during session: Mobility/safety with mobility;Balance OT goals addressed during session: ADL's and  self-care      AM-PAC OT "6 Clicks" Daily Activity     Outcome Measure   Help from another person eating meals?: A Little Help from another person taking care of personal grooming?: A Lot Help from another person toileting, which includes using toliet, bedpan, or urinal?: A Lot Help from another person bathing (including washing, rinsing, drying)?: A Lot Help from another person to put on and taking off regular upper body clothing?: A Lot Help from another person to put on and taking off regular lower body clothing?: A Lot 6 Click Score: 13    End of Session Equipment Utilized During Treatment: Gait belt;Rolling walker  OT Visit Diagnosis: Unsteadiness on feet (R26.81);Muscle weakness (generalized) (M62.81);Repeated falls (R29.6)   Activity Tolerance Patient tolerated treatment well   Patient Left in bed;with call bell/phone within reach;with bed alarm set   Nurse Communication          Time: 7893-8101 OT Time Calculation (min): 32 min  Charges: OT General Charges $OT Visit: 1 Visit OT Treatments $Self Care/Home Management : 8-22 mins  Dessie Coma, M.S. OTR/L  12/17/20, 3:28 PM  ascom [phone]

## 2020-12-17 NOTE — Progress Notes (Signed)
Physical Therapy Treatment Patient Details Name: Manuel Reynolds MRN: [id number] DOB: 23-Dec-1929 Today's Date: 12/17/2020    History of Present Illness Patient is an 87 year old male whose wife reports patient c/o of shortness of breath while eating dinner, could hear crackling noises coming from his chest. She called EMS. EMS noted 95% on RA, complicated by agitation with patient pulling off bipap. Arrived to ED on NRB 15L/min sat 95% PMh includes dementia, alzheimers, anxiety, hypothyroidism, lymphoma in remission, BLE edema on prm lasix, CKD stage III.    PT Comments    Pt seen for PT/OT co-tx to maximize participation in functional mobility & for pt & therapists safety. Pt requires Max assist +2 for bed mobility & sit<>stand EOB with HHA then with RW. Pt demonstrates posterior lean in sitting that is worsened in standing but once therapist manually facilitates anterior pelvic shift pt with more upright posture in standing. Pt is able to perform marches standing EOB with max assist +2. Assisted pt with consuming thickened liquid & washing face during session. Will continue to follow pt acutely to progress transfers & gait as able. Pt continues to demonstrate need for STR upon d/c to maximize independence with functional mobility, decrease fall risk, & decrease caregiver burden prior to returning home.    Follow Up Recommendations  SNF     Equipment Recommendations  None recommended by PT    Recommendations for Other Services       Precautions / Restrictions Precautions Precautions: Fall Restrictions Weight Bearing Restrictions: No    Mobility  Bed Mobility Overal bed mobility: Needs Assistance Bed Mobility: Supine to Sit;Sit to Supine;Rolling Rolling: Max assist (bed rails, assist to obtain position but then pt able to hold to bed rails & maintain sidelying with CGA/min assist)   Supine to sit: Max assist;+2 for physical assistance;HOB elevated Sit to supine: Max assist;+2 for  physical assistance;HOB elevated      Transfers Overall transfer level: Needs assistance Equipment used: 2 person hand held assist;Rolling walker (2 wheeled) Transfers: Sit to/from Stand Sit to Stand: Max assist;+2 physical assistance;From elevated surface         General transfer comment: Pt requires MAX +2 for sit>stand as pt with strong posterior lean/pushing back on EOB with BLE. Requires manual facilitation for anterior pelvic shift & upright posture.  Ambulation/Gait                 Stairs             Wheelchair Mobility    Modified Rankin (Stroke Patients Only)       Balance Overall balance assessment: Needs assistance Sitting-balance support: Bilateral upper extremity supported;Feet supported Sitting balance-Leahy Scale: Fair Sitting balance - Comments: close supervision<>Min assist for static sitting EOB. Pt with posterior lean & engages in reaching forward to give PT high five to focus on anterior shifting. Pt eventually able to anteriorly shift & rest B elbows on knees. Postural control: Posterior lean Standing balance support: Bilateral upper extremity supported;During functional activity Standing balance-Leahy Scale: Zero Standing balance comment: Max assist +2 for static standing balance but can fade to as little as min assist +2. Posterior lean.               High Level Balance Comments: Pt able to engage in marching standing EOB with max assist +2 HHA.            Cognition Arousal/Alertness: Awake/alert Behavior During Therapy: Flat affect Overall Cognitive Status: Impaired/Different from baseline Area  of Impairment: Orientation;Awareness;Memory;Attention;Following commands;Problem solving;Safety/judgement                 Orientation Level: Disoriented to;Place;Time;Situation Current Attention Level: Focused Memory: Decreased recall of precautions;Decreased short-term memory Following Commands: Follows one step commands  inconsistently;Follows one step commands with increased time Safety/Judgement: Decreased awareness of safety;Decreased awareness of deficits Awareness: Intellectual Problem Solving: Slow processing;Difficulty sequencing;Requires verbal cues;Requires tactile cues General Comments: Pt confused, slurred speech      Exercises      General Comments General comments (skin integrity, edema, etc.): Provided pt with honey thick liquid & assisted pt with holding cup.      Pertinent Vitals/Pain Pain Assessment: Faces Faces Pain Scale: No hurt    Home Living                      Prior Function            PT Goals (current goals can now be found in the care plan section) Acute Rehab PT Goals Patient Stated Goal: patient unable to state PT Goal Formulation: Patient unable to participate in goal setting Progress towards PT goals: Progressing toward goals    Frequency    Min 2X/week      PT Plan Current plan remains appropriate    Co-evaluation PT/OT/SLP Co-Evaluation/Treatment: Yes Reason for Co-Treatment: For patient/therapist safety;To address functional/ADL transfers;Complexity of the patient's impairments (multi-system involvement) PT goals addressed during session: Mobility/safety with mobility;Balance;Strengthening/ROM;Proper use of DME OT goals addressed during session: ADL's and self-care;Strengthening/ROM      AM-PAC PT "6 Clicks" Mobility   Outcome Measure  Help needed turning from your back to your side while in a flat bed without using bedrails?: A Lot Help needed moving from lying on your back to sitting on the side of a flat bed without using bedrails?: Total Help needed moving to and from a bed to a chair (including a wheelchair)?: Total Help needed standing up from a chair using your arms (e.g., wheelchair or bedside chair)?: Total Help needed to walk in hospital room?: Total Help needed climbing 3-5 steps with a railing? : Total 6 Click Score: 7     End of Session Equipment Utilized During Treatment: Gait belt Activity Tolerance: Patient tolerated treatment well Patient left: in bed;with call bell/phone within reach;with bed alarm set   PT Visit Diagnosis: Unsteadiness on feet (R26.81);Other abnormalities of gait and mobility (R26.89);Muscle weakness (generalized) (M62.81);Difficulty in walking, not elsewhere classified (R26.2)     Time: 1610-9604 PT Time Calculation (min) (ACUTE ONLY): 24 min  Charges:  $Therapeutic Activity: 8-22 mins                     [name], PT, DPT 12/17/20, 2:23 PM    [name] 12/17/2020, 2:21 PM

## 2020-12-17 NOTE — Progress Notes (Signed)
PMT consult received and chart reviewed. Met with wife. Patient is awake, alert and working with physical therapy this afternoon. He is also accepting many bites and sips for speech therapist.   Copy of living will placed in chart. Completed MOST form with wife. Decisions include: DNR/DNI, NO feeding tube, ok with time trial of IVF/ABX. At this time, wife wishes to pursue SNF rehab on discharge. If Rainer further declines at rehab, she is open to hospice services and comfort measures.   Updated RN, Dr. Sabino Gasser, and SW. Outpatient palliatve referral.   Full palliative note to follow.  NO CHARGE  Ihor Dow, [place], FNP-C Palliative Medicine Team  Phone: [phone] Fax: [phone]

## 2020-12-17 NOTE — TOC Progression Note (Addendum)
Transition of Care [facility]) - Progression Note    Patient Details  Name: Manuel Reynolds MRN: [id number] Date of Birth: 1930-03-30  Transition of Care [facility]) CM/SW [place], LCSW Phone Number: 12/17/2020, 2:24 PM  Clinical Narrative:   Per Palliative NP, plan for SNF rehab with OP Palliative. Both [facility] and [facility] are not taking patients today. Asked Admissions Workers at both SNFs if they can take patient tomorrow.  3:10- [facility] can accept patient tomorrow for SNF rehab with Outpatient Palliative. Updated MD and patient's wife.        Expected Discharge Plan and Services                                                 Social Determinants of Health (SDOH) Interventions    Readmission Risk Interventions No flowsheet data found.

## 2020-12-17 NOTE — Consult Note (Signed)
Consultation Note Date: 12/17/2020   Patient Name: Manuel Reynolds  DOB: 18-Jan-1930  MRN: [id number]  Age / Sex: 87 y.o., male  PCP: [name], DO Referring Physician: Dwyane Dee, MD  Reason for Consultation: Establishing goals of care  HPI/Patient Profile: 87 y.o. male  with past medical history of Alzheimer's dementia, lymphoma in remission, HTN, CKD, hypothyroidism admitted on 12/09/2020 with shortness of breath. CXR revealed bilateral opacities and bilateral pleural effusions. Hospital admission for acute diastolic CHF with worsening mental status. MRI negative for acute stroke but with underlying Alzheimer's dementia. Poorly working with therapy. Palliative medicine consultation for goals of care.   Clinical Assessment and Goals of Care:  I have reviewed medical records, discussed with care team, and met with wife in family waiting area to discuss goals of care. Manuel Reynolds is awake and alert this afternoon. He is confused with baseline dementia. He is participating with physical therapy and speech therapy this afternoon. He is accepting bites and sips. No s/s of pain or distress.   I introduced Palliative Medicine as specialized medical care for people living with serious illness. It focuses on providing relief from the symptoms and stress of a serious illness. The goal is to improve quality of life for both the patient and the family.  We discussed a brief life review of the patient. Lives with wife. She is his primary caregiver.Wife reports declining functional status for about one month. Prior to hospitalization, Manuel Reynolds was able to feed self with encouragement but required assist with many ADL's.   Discussed events leading up to admission and course of hospitalization including including diagnoses, interventions, plan of care. Discussed dementia disease trajectory and expectations including high  risk for recurrent hospitalization due to complications from progressive dementia. Wife shares her belief that compared to the weekend, Manuel Reynolds is more awake, alert, and accepting of food and drink this afternoon. She is hopeful if discharge to SNF rehab, he may gain some strength to return home. She cannot take him home in his current condition.    I attempted to elicit values and goals of care important to the patient. Advanced directives, concepts specific to code status, artifical feeding and hydration, and rehospitalization were considered and discussed. Wife provides patient's living will documentation where he clearly expresses his wish against life-prolonging measures if terminal or incurable. Copy placed in chart to be scanned into EMR.   Reviewed and completed MOST form with wife. She confirms decision for DNR code status. MOST form: DNR/DNI, comfort focused care if further decline at SNF, IVF/ABX for time trial and NO feeding tube. Electronic [employer] MOST completed. Durable DNR completed.   Again discussed plan for SNF rehab on discharge with outpatient palliative follow-up. Explained that this can be an easy transition to hospice services if he fails to progress at rehab. Wife is open to further hospice discussions pending his ability to rehab.   Questions and concerns were addressed. Hard Choices and PMT contact information given.     SUMMARY OF RECOMMENDATIONS  [place] discussion with wife, Manuel Reynolds.  Completed MOST form. Decisions include: DNR/DNI, comfort focused pathway if further decline following rehab attempts, IVF/ABX for time trial, NO feeding tube. Electronic Vynca MOST completed. Durable DNR completed.  Copy of patient's living will placed in chart to be scanned into EMR. He clearly documented wishes against life-prolonging measures if terminal or incurable.   Continue current plan of care and medical management.  Continue PT/OT/SLP efforts.  Wife requesting discharge to  SNF to attempt rehab. She is agreeable with outpatient palliative f/u at SNF. Wife is open to further hospice discussions pending his progression at rehab.   Code Status/Advance Care Planning:  DNR/DNI  Symptom Management:   Per attending  Palliative Prophylaxis:   Aspiration, Delirium Protocol, Oral Care and Turn Reposition  Psycho-social/Spiritual:   Desire for further Chaplaincy support: yes  Additional Recommendations: Caregiving  Support/Resources, Compassionate Wean Education and Education on Hospice  Prognosis:   Poor long-term with progressive Alzheimer's dementia, declining cognitive/nutritional/functional status. High risk for recurrent admission.   Discharge Planning: [place] for rehab with Palliative care service follow-up      Primary Diagnoses: Present on Admission: . Flash pulmonary edema (HCC)   I have reviewed the medical record, interviewed the patient and family, and examined the patient. The following aspects are pertinent.  Past Medical History:  Diagnosis Date  . Anxiety   . Hypertension   . 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 Thyroid disease    Social History   Socioeconomic History  . Marital status: Married    Spouse name: Not on file  . Number of children: Not on file  . Years of education: Not on file  . Highest education level: Master's degree (e.g., MA, MS, MEng, MEd, MSW, MBA)  Occupational History  . Occupation: retired  Tobacco Use  . Smoking status: Never Smoker  . Smokeless tobacco: Never Used  Vaping Use  . Vaping Use: Never used  Substance and Sexual Activity  . Alcohol use: No  . Drug use: No  . Sexual activity: Not on file  Other Topics Concern  . Not on file  Social History Narrative  . Not on file   Social Determinants of Health   Financial Resource Strain: Low Risk   . Difficulty of Paying Living Expenses: Not hard at all  Food Insecurity: No Food Insecurity  .  Worried About Charity fundraiser in the Last Year: Never true  . Ran Out of Food in the Last Year: Never true  Transportation Needs: No Transportation Needs  . Lack of Transportation (Medical): No  . Lack of Transportation (Non-Medical): No  Physical Activity: Insufficiently Active  . Days of Exercise per Week: 7 days  . Minutes of Exercise per Session: 10 min  Stress: No Stress Concern Present  . Feeling of Stress : Not at all  Social Connections: Not on file   Family History  Problem Relation Age of Onset  . Hypertension Father   . Hypertension Mother   . Lung cancer Sister   . Diabetes Maternal Aunt   . Diabetes Maternal Uncle   . Diabetes Maternal Aunt   . Diabetes Maternal Uncle    Scheduled Meds: . aspirin EC  81 mg Oral Daily  . enoxaparin (LOVENOX) injection  40 mg Subcutaneous Q24H  . levothyroxine  50 mcg Oral Q0600  . sodium chloride flush  3 mL Intravenous Q12H   Continuous Infusions: . sodium chloride     PRN  Meds:.sodium chloride, acetaminophen, hydrALAZINE, ondansetron (ZOFRAN) IV, sodium chloride flush Medications Prior to Admission:  Prior to Admission medications   Medication Sig Start Date End Date Taking? Authorizing Provider  acebutolol (SECTRAL) 200 MG capsule TAKE 1 CAPSULE(200 MG) BY MOUTH DAILY 08/27/20  Yes Karamalegos, [name], DO  amoxicillin (AMOXIL) 875 MG tablet Take 875 mg by mouth 2 (two) times daily. 12/06/20  Yes [provider]  cholecalciferol (VITAMIN D) 25 MCG (1000 UNIT) tablet Take 1,000 Units by mouth daily.   Yes [provider]  dorzolamide-timolol (COSOPT) 22.3-6.8 MG/ML ophthalmic solution Place 1 drop into both eyes 2 (two) times daily.   Yes [provider]  furosemide (LASIX) 20 MG tablet TAKE 1 TABLET(20 MG) BY MOUTH DAILY AS NEEDED FOR FLUID RETENTION OR SWELLING 08/28/20  Yes Karamalegos, [name], DO  latanoprost (XALATAN) 0.005 % ophthalmic solution Place 1 drop into both eyes at bedtime.    Yes [provider]  levothyroxine (SYNTHROID) 50 MCG tablet TAKE 1 TABLET(50 MCG) BY MOUTH DAILY BEFORE BREAKFAST 08/08/20  Yes Karamalegos, [name], DO  losartan (COZAAR) 50 MG tablet TAKE 1 TABLET(50 MG) BY MOUTH DAILY 08/03/20  Yes Karamalegos, [name], DO  meclizine (ANTIVERT) 25 MG tablet Take 1 tablet (25 mg total) by mouth 3 (three) times daily as needed for dizziness. 09/18/20  Yes Karamalegos, [name], DO  Probiotic Product (PROBIOTIC-10 PO) Take 1 tablet by mouth daily.   Yes [provider]  sildenafil (VIAGRA) 100 MG tablet Take 1 tablet (100 mg total) by mouth as needed for erectile dysfunction. 10/25/20  Yes Malfi, Lupita Raider, FNP  simvastatin (ZOCOR) 40 MG tablet TAKE 1 TABLET(40 MG) BY MOUTH DAILY Patient taking differently: Take 40 mg by mouth at bedtime. 08/03/20  Yes Karamalegos, [name], DO   Allergies  Allergen Reactions  . Other Rash  . Sulfa Antibiotics Rash  . Sulfacetamide Sodium Rash   Review of Systems  Unable to perform ROS: Dementia   Physical Exam Vitals and nursing note reviewed.  Constitutional:      General: He is awake.     Appearance: He is ill-appearing.  Cardiovascular:     Heart sounds: Normal heart sounds.  Pulmonary:     Effort: No tachypnea, accessory muscle usage or respiratory distress.     Breath sounds: Normal breath sounds.  Skin:    General: Skin is warm and dry.     Coloration: Skin is pale.  Neurological:     Mental Status: He is alert.     Comments: Disoriented with baseline dementia. Will follow simple commands    Vital Signs: BP 120/72 (BP Location: Right Arm)   Pulse 92   Temp 98.2 F (36.8 C) (Oral)   Resp 18   Ht '5\' 7"'  (1.702 m)   Wt 68.9 kg   SpO2 93%   BMI 23.81 kg/m  Pain Scale: 0-10   Pain Score: 0-No pain   SpO2: SpO2: 93 % O2 Device:SpO2: 93 % O2 Flow Rate: .O2 Flow Rate (L/min): 6 L/min  IO: Intake/output summary:   Intake/Output Summary (Last 24 hours) at 12/17/2020  1445 Last data filed at 12/16/2020 2150 Gross per 24 hour  Intake 3 ml  Output 100 ml  Net -97 ml    LBM:   Baseline Weight: Weight: 74.8 kg Most recent weight: Weight: 68.9 kg     Palliative Assessment/Data: PPS 40%   Flowsheet Rows   Flowsheet Row Most Recent Value  Intake Tab  Referral Department Hospitalist  Unit at Time of Referral Med/Surg Unit  Palliative Care Primary Diagnosis Neurology  Palliative Care Type New Palliative care  Reason for referral Clarify Goals of Care  Date first seen by Palliative Care 12/17/20  Clinical Assessment   Palliative Performance Scale Score 40%  Psychosocial & Spiritual Assessment   Palliative Care Outcomes   Patient/Family meeting held? Yes  Who was at the meeting? wife  Palliative Care Outcomes Clarified goals of care, Counseled regarding hospice, Provided end of life care assistance, Provided psychosocial or spiritual support, Provided advance care planning, Completed durable DNR, Linked to palliative care logitudinal support, ACP counseling assistance       Time Total: 70 Greater than 50%  of this time was spent counseling and coordinating care related to the above assessment and plan.  Signed by:  Ihor Dow, DNP, FNP-C Palliative Medicine Team  Phone: [phone] Fax: [phone]   Please contact Palliative Medicine Team phone at [phone] for questions and concerns.  For individual provider: See Shea Evans

## 2020-12-17 NOTE — Progress Notes (Signed)
PMT consult received and chart reviewed. Visited with patient at bedside. He is disoriented with baseline dementia. Nursing student at bedside attempting to give him bites and sips of lunch. No family at bedside. VM left for wife to discuss goals of care. Awaiting return call.  NO CHARGE  Ihor Dow, [place], FNP-C Palliative Medicine Team  Phone: [phone] Fax: [phone]

## 2020-12-18 DIAGNOSIS — I509 Heart failure, unspecified: Secondary | ICD-10-CM | POA: Diagnosis not present

## 2020-12-18 DIAGNOSIS — E039 Hypothyroidism, unspecified: Secondary | ICD-10-CM | POA: Diagnosis not present

## 2020-12-18 DIAGNOSIS — J9601 Acute respiratory failure with hypoxia: Secondary | ICD-10-CM | POA: Diagnosis not present

## 2020-12-18 DIAGNOSIS — C8590 Non-Hodgkin lymphoma, unspecified, unspecified site: Secondary | ICD-10-CM | POA: Diagnosis not present

## 2020-12-18 DIAGNOSIS — I110 Hypertensive heart disease with heart failure: Secondary | ICD-10-CM | POA: Diagnosis not present

## 2020-12-18 DIAGNOSIS — G9341 Metabolic encephalopathy: Secondary | ICD-10-CM | POA: Diagnosis not present

## 2020-12-18 DIAGNOSIS — R4182 Altered mental status, unspecified: Secondary | ICD-10-CM | POA: Diagnosis not present

## 2020-12-18 DIAGNOSIS — I5022 Chronic systolic (congestive) heart failure: Secondary | ICD-10-CM | POA: Diagnosis not present

## 2020-12-18 DIAGNOSIS — R6510 Systemic inflammatory response syndrome (SIRS) of non-infectious origin without acute organ dysfunction: Secondary | ICD-10-CM | POA: Diagnosis not present

## 2020-12-18 DIAGNOSIS — R404 Transient alteration of awareness: Secondary | ICD-10-CM | POA: Diagnosis not present

## 2020-12-18 DIAGNOSIS — A419 Sepsis, unspecified organism: Secondary | ICD-10-CM | POA: Diagnosis not present

## 2020-12-18 DIAGNOSIS — F0280 Dementia in other diseases classified elsewhere without behavioral disturbance: Secondary | ICD-10-CM | POA: Diagnosis not present

## 2020-12-18 DIAGNOSIS — I5033 Acute on chronic diastolic (congestive) heart failure: Secondary | ICD-10-CM | POA: Diagnosis not present

## 2020-12-18 DIAGNOSIS — I129 Hypertensive chronic kidney disease with stage 1 through stage 4 chronic kidney disease, or unspecified chronic kidney disease: Secondary | ICD-10-CM | POA: Diagnosis not present

## 2020-12-18 DIAGNOSIS — E785 Hyperlipidemia, unspecified: Secondary | ICD-10-CM | POA: Diagnosis not present

## 2020-12-18 DIAGNOSIS — I130 Hypertensive heart and chronic kidney disease with heart failure and stage 1 through stage 4 chronic kidney disease, or unspecified chronic kidney disease: Secondary | ICD-10-CM | POA: Diagnosis not present

## 2020-12-18 DIAGNOSIS — R1310 Dysphagia, unspecified: Secondary | ICD-10-CM | POA: Diagnosis not present

## 2020-12-18 DIAGNOSIS — E782 Mixed hyperlipidemia: Secondary | ICD-10-CM | POA: Diagnosis not present

## 2020-12-18 DIAGNOSIS — G309 Alzheimer's disease, unspecified: Secondary | ICD-10-CM | POA: Diagnosis not present

## 2020-12-18 DIAGNOSIS — R410 Disorientation, unspecified: Secondary | ICD-10-CM | POA: Diagnosis not present

## 2020-12-18 DIAGNOSIS — I4891 Unspecified atrial fibrillation: Secondary | ICD-10-CM | POA: Diagnosis not present

## 2020-12-18 DIAGNOSIS — E870 Hyperosmolality and hypernatremia: Secondary | ICD-10-CM | POA: Diagnosis not present

## 2020-12-18 DIAGNOSIS — R627 Adult failure to thrive: Secondary | ICD-10-CM | POA: Diagnosis not present

## 2020-12-18 DIAGNOSIS — M2550 Pain in unspecified joint: Secondary | ICD-10-CM | POA: Diagnosis not present

## 2020-12-18 DIAGNOSIS — Z79899 Other long term (current) drug therapy: Secondary | ICD-10-CM | POA: Diagnosis not present

## 2020-12-18 DIAGNOSIS — F0281 Dementia in other diseases classified elsewhere with behavioral disturbance: Secondary | ICD-10-CM | POA: Diagnosis not present

## 2020-12-18 DIAGNOSIS — R571 Hypovolemic shock: Secondary | ICD-10-CM | POA: Diagnosis not present

## 2020-12-18 DIAGNOSIS — Z7401 Bed confinement status: Secondary | ICD-10-CM | POA: Diagnosis not present

## 2020-12-18 DIAGNOSIS — J189 Pneumonia, unspecified organism: Secondary | ICD-10-CM | POA: Diagnosis not present

## 2020-12-18 DIAGNOSIS — I959 Hypotension, unspecified: Secondary | ICD-10-CM | POA: Diagnosis not present

## 2020-12-18 DIAGNOSIS — W19XXXA Unspecified fall, initial encounter: Secondary | ICD-10-CM | POA: Diagnosis not present

## 2020-12-18 DIAGNOSIS — M6281 Muscle weakness (generalized): Secondary | ICD-10-CM | POA: Diagnosis not present

## 2020-12-18 DIAGNOSIS — I10 Essential (primary) hypertension: Secondary | ICD-10-CM | POA: Diagnosis not present

## 2020-12-18 DIAGNOSIS — N1831 Chronic kidney disease, stage 3a: Secondary | ICD-10-CM | POA: Diagnosis not present

## 2020-12-18 LAB — SARS CORONAVIRUS 2 (TAT 6-24 HRS): SARS Coronavirus 2: NEGATIVE

## 2020-12-18 LAB — BASIC METABOLIC PANEL
Anion gap: 12 (ref 5–15)
BUN: 33 mg/dL — ABNORMAL HIGH (ref 8–23)
CO2: 24 mmol/L (ref 22–32)
Calcium: 8.7 mg/dL — ABNORMAL LOW (ref 8.9–10.3)
Chloride: 107 mmol/L (ref 98–111)
Creatinine, Ser: 1.08 mg/dL (ref 0.61–1.24)
GFR, Estimated: >60 mL/min (ref >60)
Glucose, Bld: 97 mg/dL (ref 70–99)
Potassium: 3.4 mmol/L — ABNORMAL LOW (ref 3.5–5.1)
Sodium: 143 mmol/L (ref 135–145)

## 2020-12-18 LAB — MAGNESIUM: Magnesium: 2 mg/dL (ref 1.7–2.4)

## 2020-12-18 LAB — SARS CORONAVIRUS 2 BY RT PCR (HOSPITAL ORDER, PERFORMED IN ~~LOC~~ HOSPITAL LAB): SARS Coronavirus 2: NEGATIVE

## 2020-12-18 MED ORDER — FUROSEMIDE 10 MG/ML IJ SOLN
40.0000 mg | Freq: Every day | INTRAMUSCULAR | Status: DC
  Administered 2020-12-18: 40 mg via INTRAVENOUS
  Filled 2020-12-18: qty 4

## 2020-12-18 MED ORDER — POTASSIUM CHLORIDE 20 MEQ PO PACK
20.0000 meq | PACK | Freq: Once | ORAL | Status: AC
  Administered 2020-12-18: 20 meq via ORAL
  Filled 2020-12-18: qty 1

## 2020-12-18 NOTE — Care Management Important Message (Signed)
Important Message  Patient Details  Name: Manuel Reynolds MRN: [id number] Date of Birth: 11-08-1930   Medicare Important Message Given:  Yes     Juliann Pulse A Trevian Hayashida 12/18/2020, 11:01 AM

## 2020-12-18 NOTE — TOC Transition Note (Addendum)
Transition of Care [facility]) - CM/SW Discharge Note   Patient Details  Name: Manuel Reynolds MRN: [id number] Date of Birth: 14-Jun-1930  [facility]) CM/SW Contact:  Magnus Ivan, LCSW Phone Number: 12/18/2020, 12:47 PM   Clinical Narrative:   Patient to discharge to [facility] today with [facility], Room 306. Confirmed with Neoma Laming at [facility]. CSW updated MD, RN, patient's wife, and Kieth Brightly with [facility]. Asked RN to call report and MD to submit DC Summary. Medical Necessity Form, DNR, and Face Sheet placed in Discharge Packet by patient chart.   2:40- Correct COVID test has been ordered. Confirmed with Surveyor, quantity of 1A, RN to call for nonemergent EMS when COVID results are back this afternoon. CSW updated Neoma Laming at [facility] that EMS will be called once COVID results are back today.   Final next level of care: Skilled Nursing Facility Barriers to Discharge: Barriers Resolved   Patient Goals and CMS Choice Patient states their goals for this hospitalization and ongoing recovery are:: SNF rehab with outpatient palliative CMS Medicare.gov Compare Post Acute Care list provided to:: Patient Represenative (must comment) Choice offered to / list presented to : Spouse  Discharge Placement              Patient chooses bed at: [facility] Patient to be transferred to facility by: EMS Name of family member notified: Corky Sing - spouse Patient and family notified of of transfer: 12/18/20  Discharge Plan and Services                                     Social Determinants of Health (SDOH) Interventions     Readmission Risk Interventions No flowsheet data found.

## 2020-12-18 NOTE — Progress Notes (Signed)
[facility]) Hospital Liaison RN note:  Received new referral for [facility] out patient palliative program to follow post discharge from Ihor Dow, NP. Patient information given to referral. Meagan Hagwood, TOC is aware. Plan is for discharge today to [facility].   Thank you for this referral.  Zandra Abts, RN [facility] Liaison  [phone]

## 2020-12-18 NOTE — Progress Notes (Signed)
Pt requested to get chocolate ice cream. 6am med crushed and given in ice cream

## 2020-12-18 NOTE — Discharge Summary (Signed)
Physician Discharge Summary  Manuel Reynolds GMW:[id number] DOB: 10/19/1930 DOA: 12/09/2020  PCP: [name], DO  Admit date: 12/09/2020 Discharge date: 12/18/2020  Admitted From: home  Disposition: SNF  Recommendations for Outpatient Follow-up:  1. Follow up with PCP in 1-2 weeks 2. F/u w/ cardio in 1 week  3. F/u w/ palliative care in 1-2 days  Home Health: no  Equipment/Devices:  Discharge Condition: stable  CODE STATUS: full  Diet recommendation: Dysphagia   Brief/Interim Summary: HPI was taken from Dr. Marcello Moores: Manuel Reynolds is a 87 y.o. male with medical history significant of  Dementia Alzheimer's, anxiety, hypothyroidism, Lymphoma in remission,Hypertension,b/l lower extremity edema on prn lasix., CKDIII who presents to ed BIB EMS on NRB 15L/min sat 95%.Of note patient is a poor historian and history is taken from chart. Per chart patient has acute shortness of breath that started immediately prior to presentation. Per wife patient complained of increase sob while eating dinner. She also noted per ED MD that she heard crackling noises coming from his chest. Due to continued symptoms x 1 hour she called EMS.  In the field per noted patient sat on arrival of EMS was 60% on ra. Of note ed coarse complicated by episode of agitation, with patient pulling of bipap he was treated with one dose of droperidol 2.5mg  iv.    ED Course:  T:99.1,BP153/102, hr 92, sat 95% on NRB Labs: Wbc 14,  hgb 14.7,plt277, Na 143, k4.2, cl109, glu217, cr 1.15 at baseline, CE 46, BNP 2103 EKG: sinus tachycardia , pvc , LAFB, q in III,AVF Respiratory panel negative  Lactic 1.5. Cxr: 1. Features suggestive of CHF/volume overload with pulmonary edema and bilateral effusions. 2. More patchy and coalescent opacities could reflect developing alveolar edema though underlying infection is difficult to exclude. 3. Enlarged cardiac silhouette, possibly cardiomegaly or pericardial Effusion.  Tx:  lasix 60 mg x 1 iv , ceftriaxone/ azithromycin    As per Dr. Sabino Gasser: Manuel Reynolds is an 87 y.o.M with Alzheimer's dementia, lives at home, hypothyroidism, Lymphoma in remission, HTN, CKD IIIa baseline Cr 1.1-1.3 who presented with acute onset hypoxia and dyspnea.   On arrival to the hospital he was found to be hypoxic; CXR showed bilateral opacities and bilateral pleural effusions.  Blood pressure was also uncontrolled and significantly elevated.  He was started on nitroglycerin infusion and Lasix.  He continued to decline in terms of his mentation and developed significant delirium/encephalopathy.  He also developed worsening dysarthria, dysphagia, and underwent stroke work-up.  MRI brain was negative for acute stroke. He initially was not safe for oral intake however with reattempt with SLP, he was graduated to a trial of a dysphagia 1 diet. Goals of care were discussed and per his wife the plan was to continue watching for any improvement over the next couple days and if he does further decline then goal would be to pursue hospice.   Interval History:  No change since yesterday.  Even more lethargic today and has barely eaten any food over the last couple days.  Seems more hospice appropriate at this point.  Discussed with palliative care who plans to see later today and begin goals of care discussions with wife further.  Old records reviewed in assessment of this patient    Hospital Course from Dr. Jimmye Norman 12/18/20: Pt was given potassium for hypokalemia and restarted back on lasix as Cr had improved. Cr was 1.08 on the day of d/c. Pt was stable for transfer to SNF placement.  Discharge Diagnoses:  Active Problems:   Alzheimer disease (HCC)   CHF (congestive heart failure) (HCC)   CHF exacerbation (HCC)   Flash pulmonary edema (HCC)   Acute metabolic encephalopathy   Dysarthria   Acute hypoxemic respiratory failure (HCC)   Dysphagia   Weakness   Palliative care by  specialist   Goals of care, counseling/discussion   DNR (do not resuscitate)  Acute diastolic CHF: continue to monitor I/Os. BNP is elevated. Will continue on lasix   Dysphagia: continue on dysphagia I diet as per speech   Alzheimer's dementia: severe. Re-orient prn. Continue on mittens for safety   Hypernatremia: resolved  Hypokalemia: KCl repleted. Will continue to monitor   AKI on CKDIIIa: Cr is trending down from day prior. Will continue to monitor  Discharge Instructions  Discharge Instructions    Diet - low sodium heart healthy   Complete by: As directed    DIET - DYS 1 Room service appropriate? Yes with Assist; Fluid consistency: Nectar Thick  Diet effective now      Comments: Extra Gravies on meats, potatoes.  Yogurt and Magic Cups TID meals(chocolate, vanilla). Also Puddings. Pt may have Oatmeal per Speech w/ butter/sugar   Discharge instructions   Complete by: As directed    F/u w/ PCP in 1-2 weeks. F/u w/ cardio in 1 week   Increase activity slowly   Complete by: As directed      Allergies as of 12/18/2020      Reactions   Other Rash   Sulfa Antibiotics Rash   Sulfacetamide Sodium Rash      Medication List    TAKE these medications   acebutolol 200 MG capsule Commonly known as: SECTRAL TAKE 1 CAPSULE(200 MG) BY MOUTH DAILY   amoxicillin 875 MG tablet Commonly known as: AMOXIL Take 875 mg by mouth 2 (two) times daily.   cholecalciferol 25 MCG (1000 UNIT) tablet Commonly known as: VITAMIN D Take 1,000 Units by mouth daily.   dorzolamide-timolol 22.3-6.8 MG/ML ophthalmic solution Commonly known as: COSOPT Place 1 drop into both eyes 2 (two) times daily.   furosemide 20 MG tablet Commonly known as: LASIX TAKE 1 TABLET(20 MG) BY MOUTH DAILY AS NEEDED FOR FLUID RETENTION OR SWELLING   latanoprost 0.005 % ophthalmic solution Commonly known as: XALATAN Place 1 drop into both eyes at bedtime.   levothyroxine 50 MCG tablet Commonly known as:  SYNTHROID TAKE 1 TABLET(50 MCG) BY MOUTH DAILY BEFORE BREAKFAST   losartan 50 MG tablet Commonly known as: COZAAR TAKE 1 TABLET(50 MG) BY MOUTH DAILY   meclizine 25 MG tablet Commonly known as: ANTIVERT Take 1 tablet (25 mg total) by mouth 3 (three) times daily as needed for dizziness.   PROBIOTIC-10 PO Take 1 tablet by mouth daily.   sildenafil 100 MG tablet Commonly known as: Viagra Take 1 tablet (100 mg total) by mouth as needed for erectile dysfunction.   simvastatin 40 MG tablet Commonly known as: ZOCOR TAKE 1 TABLET(40 MG) BY MOUTH DAILY What changed: See the new instructions.       Contact information for follow-up providers    [facility] Follow up on 12/25/2020.   Specialty: Cardiology Why: at 1:00pm. Enter through the [place] entrance Contact information: [place] [phone]           Contact information for after-discharge care    Destination    [facility] .  Service: Skilled Nursing Contact information: [address] [place] [phone]                 Allergies  Allergen Reactions  . Other Rash  . Sulfa Antibiotics Rash  . Sulfacetamide Sodium Rash    Consultations: Palliative care  Procedures/Studies: MR BRAIN WO CONTRAST  Result Date: 12/14/2020 CLINICAL DATA:  Neuro deficit, acute, stroke suspected EXAM: MRI HEAD WITHOUT CONTRAST TECHNIQUE: Multiplanar, multiecho pulse sequences of the brain and surrounding structures were obtained without intravenous contrast. COMPARISON:  None. FINDINGS: Please note that some image sequences are degraded by motion artifact. Brain: No diffusion-weighted signal abnormality. No intracranial hemorrhage. No midline shift, ventriculomegaly or extra-axial fluid collection. No mass lesion. Mild cerebral atrophy with ex vacuo dilatation.  Moderate chronic microvascular ischemic changes. Vascular: Major intracranial flow voids are proximally preserved. Skull and upper cervical spine: Normal marrow signal. Sinuses/Orbits: Sequela of bilateral lens replacement. Clear paranasal sinuses and mastoid air cells. Other: None. IMPRESSION: No acute intracranial process. Mild cerebral atrophy and moderate chronic microvascular ischemic changes. Electronically Signed   By: Primitivo Gauze M.D.   On: 12/14/2020 14:14   [place] CHEST (PLEURAL EFFUSION)  Result Date: 12/13/2020 CLINICAL DATA:  Suggestion of bilateral pleural effusions by prior chest x-ray. EXAM: CHEST ULTRASOUND COMPARISON:  Chest x-ray on 12/09/2000 FINDINGS: Ultrasound demonstrates a small right pleural effusion and trace left pleural fluid. There was not enough fluid volume to perform thoracentesis on the right. IMPRESSION: Lack of sufficient pleural fluid volume to perform thoracentesis. Small right pleural effusion and trace left pleural effusion present. Electronically Signed   By: Aletta Edouard M.D.   On: 12/13/2020 10:50   DG Chest Portable 1 View  Result Date: 12/09/2020 CLINICAL DATA:  Quit dyspnea, hypoxic, question fluid overload EXAM: PORTABLE CHEST 1 VIEW COMPARISON:  None. FINDINGS: Areas of mixed hazy interstitial and patchy opacity are present in both lungs with a perihilar and basilar predominance. Some additional areas of more coalescent opacity are seen in the medial lung bases with bandlike opacities in both lower lobes, possibly scarring or subsegmental atelectatic change. The pulmonary vascularity is indistinct. Small bilateral effusions. No pneumothoraces. The cardiac silhouette is likely enlarged, possibly cardiomegaly or pericardial effusion. The aorta is calcified. The remaining cardiomediastinal contours are unremarkable. No acute osseous or soft tissue abnormality. Degenerative changes are present in the imaged spine and shoulders. Telemetry leads and external  support devices overlie the chest. IMPRESSION: 1. Features suggestive of CHF/volume overload with pulmonary edema and bilateral effusions. 2. More patchy and coalescent opacities could reflect developing alveolar edema though underlying infection is difficult to exclude. 3. Enlarged cardiac silhouette, possibly cardiomegaly or pericardial effusion. Electronically Signed   By: Lovena Le M.D.   On: 12/09/2020 22:45   ECHOCARDIOGRAM COMPLETE  Result Date: 12/10/2020    ECHOCARDIOGRAM REPORT   Patient Name:   Manuel Reynolds Date of Exam: 12/10/2020 Medical Rec #:  [id number]    Height:       67.0 in Accession #:    [id number]   Weight:       165.0 lb Date of Birth:  1930-05-06    BSA:          1.863 m Patient Age:    27 years     BP:           148/93 mmHg Patient Gender: M            HR:  53 bpm. Exam Location:  [facility] Procedure: 2D Echo and Cardiac Doppler Indications:     CHF-acute systolic [id number]  History:         Patient has no prior history of Echocardiogram examinations.                  Risk Factors:Hypertension.  Sonographer:     Sherrie Sport RDCS (AE) Referring Phys:  [id number] Victoriano Lain A THOMAS Diagnosing Phys: Kathlyn Sacramento MD  Sonographer Comments: Technically challenging study due to limited acoustic windows, no apical window and suboptimal parasternal window. Image acquisition challenging due to patient body habitus. IMPRESSIONS  1. Left ventricular ejection fraction, by estimation, is 50 to 55%. The left ventricle has low normal function. Left ventricular endocardial border not optimally defined to evaluate regional wall motion. There is moderate left ventricular hypertrophy. Left ventricular diastolic parameters are indeterminate.  2. Right ventricular systolic function is normal. The right ventricular size is normal.  3. Moderate pleural effusion in the left lateral region.  4. The mitral valve is normal in structure. No evidence of mitral valve regurgitation. No evidence of mitral stenosis.  5.  The aortic valve is normal in structure. Aortic valve regurgitation is not visualized. Mild to moderate aortic valve sclerosis. There is likely some degree of aortic stenosis. However, no gradient was recorded.  6. The inferior vena cava is normal in size with greater than 50% respiratory variability, suggesting right atrial pressure of 3 mmHg. FINDINGS  Left Ventricle: Left ventricular ejection fraction, by estimation, is 50 to 55%. The left ventricle has low normal function. Left ventricular endocardial border not optimally defined to evaluate regional wall motion. The left ventricular internal cavity  size was normal in size. There is moderate left ventricular hypertrophy. Left ventricular diastolic parameters are indeterminate. Right Ventricle: The right ventricular size is normal. No increase in right ventricular wall thickness. Right ventricular systolic function is normal. Left Atrium: Left atrial size was normal in size. Right Atrium: Right atrial size was normal in size. Pericardium: There is no evidence of pericardial effusion. Mitral Valve: The mitral valve is normal in structure. No evidence of mitral valve regurgitation. No evidence of mitral valve stenosis. Tricuspid Valve: The tricuspid valve is normal in structure. Tricuspid valve regurgitation is not demonstrated. No evidence of tricuspid stenosis. Aortic Valve: The aortic valve is normal in structure. Aortic valve regurgitation is not visualized. Mild to moderate aortic valve sclerosis/calcification is present, without any evidence of aortic stenosis. Pulmonic Valve: The pulmonic valve was normal in structure. Pulmonic valve regurgitation is not visualized. No evidence of pulmonic stenosis. Aorta: The aortic root is normal in size and structure. Venous: The inferior vena cava is normal in size with greater than 50% respiratory variability, suggesting right atrial pressure of 3 mmHg. IAS/Shunts: No atrial level shunt detected by color flow Doppler.  Additional Comments: There is a moderate pleural effusion in the left lateral region.  LEFT VENTRICLE PLAX 2D LVIDd:         3.10 cm LVIDs:         2.80 cm LV PW:         1.50 cm LV IVS:        1.80 cm LVOT diam:     2.00 cm LVOT Area:     3.14 cm  LEFT ATRIUM         Index LA diam:    3.60 cm 1.93 cm/m  PULMONIC VALVE AORTA                 PV Vmax:        0.54 m/s Ao Root diam: 3.20 cm PV Peak grad:   1.1 mmHg                       RVOT Peak grad: 2 mmHg   SHUNTS Systemic Diam: 2.00 cm Kathlyn Sacramento MD Electronically signed by Kathlyn Sacramento MD Signature Date/Time: 12/10/2020/1:37:06 PM    Final        Subjective: Pt c/o fatigue   Discharge Exam: Vitals:   12/18/20 0524 12/18/20 0930  BP: (!) 155/91 139/88  Pulse: 94 (!) 104  Resp: 18 18  Temp: 97.6 F (36.4 C) 98.4 F (36.9 C)  SpO2: 100% 100%   Vitals:   12/18/20 0057 12/18/20 0524 12/18/20 0525 12/18/20 0930  BP: (!) 145/88 (!) 155/91  139/88  Pulse: 93 94  (!) 104  Resp: 16 18  18   Temp: 98.7 F (37.1 C) 97.6 F (36.4 C)  98.4 F (36.9 C)  TempSrc: Oral Oral  Oral  SpO2: 100% 100%  100%  Weight:   71.2 kg   Height:        General: Pt is alert, awake, not in acute distress Cardiovascular: S1/S2 +, no rubs, no gallops Respiratory: CTA bilaterally, no wheezing, no rhonchi Abdominal: Soft, NT, ND, bowel sounds + Extremities: no cyanosis    The results of significant diagnostics from this hospitalization (including imaging, microbiology, ancillary and laboratory) are listed below for reference.     Microbiology: Recent Results (from the past 240 hour(s))  Resp Panel by RT-PCR (Flu A&B, Covid) Nasopharyngeal Swab     Status: None   Collection Time: 12/09/20 10:18 PM   Specimen: Nasopharyngeal Swab; Nasopharyngeal(NP) swabs in vial transport medium  Result Value Ref Range Status   SARS Coronavirus 2 by RT PCR NEGATIVE NEGATIVE Final    Comment: (NOTE) SARS-CoV-2 target nucleic acids are NOT  DETECTED.  The SARS-CoV-2 RNA is generally detectable in upper respiratory specimens during the acute phase of infection. The lowest concentration of SARS-CoV-2 viral copies this assay can detect is 138 copies/mL. A negative result does not preclude SARS-Cov-2 infection and should not be used as the sole basis for treatment or other patient management decisions. A negative result may occur with  improper specimen collection/handling, submission of specimen other than nasopharyngeal swab, presence of viral mutation(s) within the areas targeted by this assay, and inadequate number of viral copies(<138 copies/mL). A negative result must be combined with clinical observations, patient history, and epidemiological information. The expected result is Negative.  Fact Sheet for Patients:  EntrepreneurPulse.com.au  Fact Sheet for Healthcare Providers:  IncredibleEmployment.be  This test is no t yet approved or cleared by the [place] FDA and  has been authorized for detection and/or diagnosis of SARS-CoV-2 by FDA under an Emergency Use Authorization (EUA). This EUA will remain  in effect (meaning this test can be used) for the duration of the COVID-19 declaration under Section 564(b)(1) of the Act, 21 U.S.C.section 360bbb-3(b)(1), unless the authorization is terminated  or revoked sooner.       Influenza A by PCR NEGATIVE NEGATIVE Final   Influenza B by PCR NEGATIVE NEGATIVE Final    Comment: (NOTE) The Xpert Xpress SARS-CoV-2/FLU/RSV plus assay is intended as an aid in the diagnosis of influenza from Nasopharyngeal swab specimens and should not be used as  a sole basis for treatment. Nasal washings and aspirates are unacceptable for Xpert Xpress SARS-CoV-2/FLU/RSV testing.  Fact Sheet for Patients: EntrepreneurPulse.com.au  Fact Sheet for Healthcare Providers: IncredibleEmployment.be  This test is not yet  approved or cleared by the [place] FDA and has been authorized for detection and/or diagnosis of SARS-CoV-2 by FDA under an Emergency Use Authorization (EUA). This EUA will remain in effect (meaning this test can be used) for the duration of the COVID-19 declaration under Section 564(b)(1) of the Act, 21 U.S.C. section 360bbb-3(b)(1), unless the authorization is terminated or revoked.  Performed at [facility], [address] [place], [place] [address]   Culture, blood (Routine X 2) w Reflex to ID Panel     Status: None   Collection Time: 12/09/20 10:53 PM   Specimen: BLOOD  Result Value Ref Range Status   Specimen Description BLOOD BLOOD LEFT FOREARM  Final   Special Requests   Final    BOTTLES DRAWN AEROBIC AND ANAEROBIC Blood Culture results may not be optimal due to an inadequate volume of blood received in culture bottles   Culture   Final    NO GROWTH 5 DAYS Performed at [facility], [address] [place], [place] [address]    Report Status 12/14/2020 FINAL  Final  Culture, blood (Routine X 2) w Reflex to ID Panel     Status: None   Collection Time: 12/09/20 10:54 PM   Specimen: BLOOD  Result Value Ref Range Status   Specimen Description BLOOD BLOOD LEFT FOREARM  Final   Special Requests   Final    BOTTLES DRAWN AEROBIC AND ANAEROBIC Blood Culture results may not be optimal due to an excessive volume of blood received in culture bottles   Culture   Final    NO GROWTH 5 DAYS Performed at [facility], [address] [place], [place] [address]    Report Status 12/14/2020 FINAL  Final     Labs: BNP (last 3 results) Recent Labs    12/09/20 2207  BNP 123456*   Basic Metabolic Panel: Recent Labs  Lab 12/13/20 0425 12/14/20 0803 12/15/20 0451 12/16/20 0517 12/17/20 0224 12/18/20 0456  NA 151* 151* 149* 142 144 143  K 3.1* 3.1* 4.0 3.6 3.9 3.4*  CL 110 109 110 105 107 107  CO2 27 28 29 28 27 24   GLUCOSE 105* 115* 142*  97 91 97  BUN 46* 63* 52* 32* 35* 33*  CREATININE 1.51* 1.53* 1.34* 1.13 1.30* 1.08  CALCIUM 9.0 9.0 8.9 8.6* 8.4* 8.7*  MG 2.3  --   --  2.2 2.2 2.0   Liver Function Tests: Recent Labs  Lab 12/13/20 0425  AST 60*  ALT 42  ALKPHOS 74  BILITOT 1.9*  PROT 6.2*  ALBUMIN 3.2*   No results for input(s): LIPASE, AMYLASE in the last 168 hours. No results for input(s): AMMONIA in the last 168 hours. CBC: Recent Labs  Lab 12/14/20 0803 12/15/20 0451  WBC 12.3* 11.9*  HGB 16.3 16.1  HCT 48.7 50.9  MCV 86.0 88.4  PLT 254 224   Cardiac Enzymes: No results for input(s): CKTOTAL, CKMB, CKMBINDEX, TROPONINI in the last 168 hours. BNP: Invalid input(s): POCBNP CBG: No results for input(s): GLUCAP in the last 168 hours. D-Dimer No results for input(s): DDIMER in the last 72 hours. Hgb A1c No results for input(s): HGBA1C in the last 72 hours. Lipid Profile No results for input(s): CHOL, HDL, LDLCALC, TRIG, CHOLHDL, LDLDIRECT in  the last 72 hours. Thyroid function studies No results for input(s): TSH, T4TOTAL, T3FREE, THYROIDAB in the last 72 hours.  Invalid input(s): FREET3 Anemia work up No results for input(s): VITAMINB12, FOLATE, FERRITIN, TIBC, IRON, RETICCTPCT in the last 72 hours. Urinalysis    Component Value Date/Time   BILIRUBINUR Negative 11/30/2019 1345   PROTEINUR Negative 11/30/2019 1345   UROBILINOGEN 0.2 11/30/2019 1345   NITRITE Negative 11/30/2019 1345   LEUKOCYTESUR Negative 11/30/2019 1345   Sepsis Labs Invalid input(s): PROCALCITONIN,  WBC,  LACTICIDVEN Microbiology Recent Results (from the past 240 hour(s))  Resp Panel by RT-PCR (Flu A&B, Covid) Nasopharyngeal Swab     Status: None   Collection Time: 12/09/20 10:18 PM   Specimen: Nasopharyngeal Swab; Nasopharyngeal(NP) swabs in vial transport medium  Result Value Ref Range Status   SARS Coronavirus 2 by RT PCR NEGATIVE NEGATIVE Final    Comment: (NOTE) SARS-CoV-2 target nucleic acids are NOT  DETECTED.  The SARS-CoV-2 RNA is generally detectable in upper respiratory specimens during the acute phase of infection. The lowest concentration of SARS-CoV-2 viral copies this assay can detect is 138 copies/mL. A negative result does not preclude SARS-Cov-2 infection and should not be used as the sole basis for treatment or other patient management decisions. A negative result may occur with  improper specimen collection/handling, submission of specimen other than nasopharyngeal swab, presence of viral mutation(s) within the areas targeted by this assay, and inadequate number of viral copies(<138 copies/mL). A negative result must be combined with clinical observations, patient history, and epidemiological information. The expected result is Negative.  Fact Sheet for Patients:  EntrepreneurPulse.com.au  Fact Sheet for Healthcare Providers:  IncredibleEmployment.be  This test is no t yet approved or cleared by the [place] FDA and  has been authorized for detection and/or diagnosis of SARS-CoV-2 by FDA under an Emergency Use Authorization (EUA). This EUA will remain  in effect (meaning this test can be used) for the duration of the COVID-19 declaration under Section 564(b)(1) of the Act, 21 U.S.C.section 360bbb-3(b)(1), unless the authorization is terminated  or revoked sooner.       Influenza A by PCR NEGATIVE NEGATIVE Final   Influenza B by PCR NEGATIVE NEGATIVE Final    Comment: (NOTE) The Xpert Xpress SARS-CoV-2/FLU/RSV plus assay is intended as an aid in the diagnosis of influenza from Nasopharyngeal swab specimens and should not be used as a sole basis for treatment. Nasal washings and aspirates are unacceptable for Xpert Xpress SARS-CoV-2/FLU/RSV testing.  Fact Sheet for Patients: EntrepreneurPulse.com.au  Fact Sheet for Healthcare Providers: IncredibleEmployment.be  This test is not yet  approved or cleared by the [place] FDA and has been authorized for detection and/or diagnosis of SARS-CoV-2 by FDA under an Emergency Use Authorization (EUA). This EUA will remain in effect (meaning this test can be used) for the duration of the COVID-19 declaration under Section 564(b)(1) of the Act, 21 U.S.C. section 360bbb-3(b)(1), unless the authorization is terminated or revoked.  Performed at [facility], [address] [place], [place] [address]   Culture, blood (Routine X 2) w Reflex to ID Panel     Status: None   Collection Time: 12/09/20 10:53 PM   Specimen: BLOOD  Result Value Ref Range Status   Specimen Description BLOOD BLOOD LEFT FOREARM  Final   Special Requests   Final    BOTTLES DRAWN AEROBIC AND ANAEROBIC Blood Culture results may not be optimal due to an inadequate volume of blood received in culture bottles  Culture   Final    NO GROWTH 5 DAYS Performed at [facility], [address] [place], [place] [address]    Report Status 12/14/2020 FINAL  Final  Culture, blood (Routine X 2) w Reflex to ID Panel     Status: None   Collection Time: 12/09/20 10:54 PM   Specimen: BLOOD  Result Value Ref Range Status   Specimen Description BLOOD BLOOD LEFT FOREARM  Final   Special Requests   Final    BOTTLES DRAWN AEROBIC AND ANAEROBIC Blood Culture results may not be optimal due to an excessive volume of blood received in culture bottles   Culture   Final    NO GROWTH 5 DAYS Performed at [facility], [address] [place], [place] [address]    Report Status 12/14/2020 FINAL  Final     Time coordinating discharge: Over 30 minutes  SIGNED:   Wyvonnia Dusky, MD  [facility] 12/18/2020, 12:19 PM Pager   If 7PM-7AM, please contact night-coverage

## 2020-12-18 NOTE — Progress Notes (Signed)
Physical Therapy Treatment Patient Details Name: Manuel Reynolds MRN: [id number] DOB: 08-Dec-1929 Today's Date: 12/18/2020    History of Present Illness Patient is an 87 year old male whose wife reports patient c/o of shortness of breath while eating dinner, could hear crackling noises coming from his chest. She called EMS. EMS noted 47% on RA, complicated by agitation with patient pulling off bipap. Arrived to ED on NRB 15L/min sat 95% PMh includes dementia, alzheimers, anxiety, hypothyroidism, lymphoma in remission, BLE edema on prm lasix, CKD stage III.    PT Comments    Patient sitting in chair with CNA upon PT entering room, pt did seem like he wanted to get up and move around. No signs of pain throughout session, pt oriented to name only, able to follow one step commands with repetition and extended time. Several sit <> stands attempted maxA with RW, pt ultimately unable to maintain balance enough to safely ambulate this session due to heavy posterior lean. Pt assisted to recliner and repositioned maxAx2, all needs in reach. Chair alarm on and pt educated about call bell with poor recall, RN and CNA aware of patient status. The patient would benefit from further skilled PT intervention to progress towards goals. Recommendation remains appropriate.       Follow Up Recommendations  SNF     Equipment Recommendations  None recommended by PT    Recommendations for Other Services       Precautions / Restrictions Precautions Precautions: Fall Restrictions Weight Bearing Restrictions: No    Mobility  Bed Mobility               General bed mobility comments: Pt sitting in chair with CNA upon PT entrance into room  Transfers Overall transfer level: Needs assistance Equipment used: Rolling walker (2 wheeled) Transfers: Sit to/from [employer] Transfers Sit to Stand: Max assist Stand pivot transfers: Max assist;+2 physical assistance       General transfer comment:  MaxA, heavy posterior lean noted. maxAx2 to safely stand and pivot to recliner in room  Ambulation/Gait             General Gait Details: deferred due to safety concerns   Stairs             Wheelchair Mobility    Modified Rankin (Stroke Patients Only)       Balance Overall balance assessment: Needs assistance Sitting-balance support: Feet supported Sitting balance-Leahy Scale: Fair Sitting balance - Comments: close supervision throughout for sitting   Standing balance support: Bilateral upper extremity supported;During functional activity Standing balance-Leahy Scale: Zero Standing balance comment: Max assist +2 for static standing balance                            Cognition Arousal/Alertness: Awake/alert Behavior During Therapy: Restless Overall Cognitive Status: No family/caregiver present to determine baseline cognitive functioning                                        Exercises Other Exercises Other Exercises: Pt educated on use of call bell, poor recall noted. Chair alarm in chair, RN and CNA aware of pt status    General Comments        Pertinent Vitals/Pain Pain Assessment: Faces Faces Pain Scale: No hurt    Home Living  Prior Function            PT Goals (current goals can now be found in the care plan section)      Frequency    Min 2X/week      PT Plan Current plan remains appropriate    Co-evaluation              AM-PAC PT "6 Clicks" Mobility   Outcome Measure  Help needed turning from your back to your side while in a flat bed without using bedrails?: A Lot Help needed moving from lying on your back to sitting on the side of a flat bed without using bedrails?: Total Help needed moving to and from a bed to a chair (including a wheelchair)?: Total Help needed standing up from a chair using your arms (e.g., wheelchair or bedside chair)?: Total Help needed to  walk in hospital room?: Total Help needed climbing 3-5 steps with a railing? : Total 6 Click Score: 7    End of Session Equipment Utilized During Treatment: Gait belt Activity Tolerance: Patient tolerated treatment well Patient left: in bed;with call bell/phone within reach;with bed alarm set Nurse Communication: Mobility status PT Visit Diagnosis: Unsteadiness on feet (R26.81);Other abnormalities of gait and mobility (R26.89);Muscle weakness (generalized) (M62.81);Difficulty in walking, not elsewhere classified (R26.2)     Time: 1638-4665 PT Time Calculation (min) (ACUTE ONLY): 12 min  Charges:  $Therapeutic Exercise: 8-22 mins                     Lieutenant Diego PT, DPT 11:13 AM,12/18/20

## 2020-12-19 DIAGNOSIS — E039 Hypothyroidism, unspecified: Secondary | ICD-10-CM | POA: Diagnosis not present

## 2020-12-19 DIAGNOSIS — I509 Heart failure, unspecified: Secondary | ICD-10-CM | POA: Diagnosis not present

## 2020-12-19 DIAGNOSIS — E785 Hyperlipidemia, unspecified: Secondary | ICD-10-CM | POA: Diagnosis not present

## 2020-12-19 DIAGNOSIS — I110 Hypertensive heart disease with heart failure: Secondary | ICD-10-CM | POA: Diagnosis not present

## 2020-12-19 DIAGNOSIS — G309 Alzheimer's disease, unspecified: Secondary | ICD-10-CM | POA: Diagnosis not present

## 2020-12-19 DIAGNOSIS — I10 Essential (primary) hypertension: Secondary | ICD-10-CM | POA: Diagnosis not present

## 2020-12-19 DIAGNOSIS — F0280 Dementia in other diseases classified elsewhere without behavioral disturbance: Secondary | ICD-10-CM | POA: Diagnosis not present

## 2020-12-21 DIAGNOSIS — I509 Heart failure, unspecified: Secondary | ICD-10-CM | POA: Diagnosis not present

## 2020-12-21 DIAGNOSIS — E782 Mixed hyperlipidemia: Secondary | ICD-10-CM | POA: Diagnosis not present

## 2020-12-21 DIAGNOSIS — R1310 Dysphagia, unspecified: Secondary | ICD-10-CM | POA: Diagnosis not present

## 2020-12-21 DIAGNOSIS — I10 Essential (primary) hypertension: Secondary | ICD-10-CM | POA: Diagnosis not present

## 2020-12-21 DIAGNOSIS — E039 Hypothyroidism, unspecified: Secondary | ICD-10-CM | POA: Diagnosis not present

## 2020-12-24 DIAGNOSIS — F0281 Dementia in other diseases classified elsewhere with behavioral disturbance: Secondary | ICD-10-CM | POA: Diagnosis not present

## 2020-12-24 DIAGNOSIS — G309 Alzheimer's disease, unspecified: Secondary | ICD-10-CM | POA: Diagnosis not present

## 2020-12-25 ENCOUNTER — Ambulatory Visit: Payer: Medicare Other | Admitting: Family

## 2020-12-27 DIAGNOSIS — F0281 Dementia in other diseases classified elsewhere with behavioral disturbance: Secondary | ICD-10-CM | POA: Diagnosis not present

## 2020-12-27 DIAGNOSIS — G309 Alzheimer's disease, unspecified: Secondary | ICD-10-CM | POA: Diagnosis not present

## 2020-12-30 ENCOUNTER — Emergency Department: Payer: Medicare Other

## 2020-12-30 ENCOUNTER — Other Ambulatory Visit: Payer: Self-pay

## 2020-12-30 ENCOUNTER — Observation Stay
Admission: EM | Admit: 2020-12-30 | Discharge: 2020-12-31 | Disposition: A | Discharge status: Home / Self Care | Payer: Medicare Other | Attending: Hospitalist | Admitting: Hospitalist

## 2020-12-30 DIAGNOSIS — Z515 Encounter for palliative care: Secondary | ICD-10-CM

## 2020-12-30 DIAGNOSIS — E872 Acidosis, unspecified: Secondary | ICD-10-CM | POA: Diagnosis present

## 2020-12-30 DIAGNOSIS — I509 Heart failure, unspecified: Secondary | ICD-10-CM

## 2020-12-30 DIAGNOSIS — I130 Hypertensive heart and chronic kidney disease with heart failure and stage 1 through stage 4 chronic kidney disease, or unspecified chronic kidney disease: Secondary | ICD-10-CM | POA: Insufficient documentation

## 2020-12-30 DIAGNOSIS — F0281 Dementia in other diseases classified elsewhere with behavioral disturbance: Secondary | ICD-10-CM | POA: Insufficient documentation

## 2020-12-30 DIAGNOSIS — Z7189 Other specified counseling: Secondary | ICD-10-CM

## 2020-12-30 DIAGNOSIS — R571 Hypovolemic shock: Secondary | ICD-10-CM

## 2020-12-30 DIAGNOSIS — Z79899 Other long term (current) drug therapy: Secondary | ICD-10-CM | POA: Insufficient documentation

## 2020-12-30 DIAGNOSIS — E785 Hyperlipidemia, unspecified: Secondary | ICD-10-CM | POA: Insufficient documentation

## 2020-12-30 DIAGNOSIS — R1310 Dysphagia, unspecified: Secondary | ICD-10-CM

## 2020-12-30 DIAGNOSIS — G9341 Metabolic encephalopathy: Secondary | ICD-10-CM | POA: Diagnosis not present

## 2020-12-30 DIAGNOSIS — J9601 Acute respiratory failure with hypoxia: Secondary | ICD-10-CM | POA: Diagnosis present

## 2020-12-30 DIAGNOSIS — F0280 Dementia in other diseases classified elsewhere without behavioral disturbance: Secondary | ICD-10-CM | POA: Diagnosis not present

## 2020-12-30 DIAGNOSIS — I5022 Chronic systolic (congestive) heart failure: Secondary | ICD-10-CM | POA: Diagnosis not present

## 2020-12-30 DIAGNOSIS — R4182 Altered mental status, unspecified: Secondary | ICD-10-CM | POA: Diagnosis present

## 2020-12-30 DIAGNOSIS — R6510 Systemic inflammatory response syndrome (SIRS) of non-infectious origin without acute organ dysfunction: Secondary | ICD-10-CM | POA: Diagnosis not present

## 2020-12-30 DIAGNOSIS — N1831 Chronic kidney disease, stage 3a: Secondary | ICD-10-CM | POA: Insufficient documentation

## 2020-12-30 DIAGNOSIS — T68XXXA Hypothermia, initial encounter: Secondary | ICD-10-CM | POA: Diagnosis present

## 2020-12-30 DIAGNOSIS — E782 Mixed hyperlipidemia: Secondary | ICD-10-CM

## 2020-12-30 DIAGNOSIS — R627 Adult failure to thrive: Secondary | ICD-10-CM | POA: Diagnosis not present

## 2020-12-30 DIAGNOSIS — E870 Hyperosmolality and hypernatremia: Secondary | ICD-10-CM | POA: Diagnosis present

## 2020-12-30 DIAGNOSIS — I959 Hypotension, unspecified: Secondary | ICD-10-CM | POA: Diagnosis not present

## 2020-12-30 DIAGNOSIS — A419 Sepsis, unspecified organism: Secondary | ICD-10-CM

## 2020-12-30 DIAGNOSIS — G309 Alzheimer's disease, unspecified: Secondary | ICD-10-CM | POA: Insufficient documentation

## 2020-12-30 DIAGNOSIS — Z66 Do not resuscitate: Secondary | ICD-10-CM | POA: Diagnosis present

## 2020-12-30 DIAGNOSIS — E039 Hypothyroidism, unspecified: Secondary | ICD-10-CM | POA: Insufficient documentation

## 2020-12-30 DIAGNOSIS — Z86718 Personal history of other venous thrombosis and embolism: Secondary | ICD-10-CM

## 2020-12-30 DIAGNOSIS — R531 Weakness: Secondary | ICD-10-CM

## 2020-12-30 DIAGNOSIS — C8590 Non-Hodgkin lymphoma, unspecified, unspecified site: Secondary | ICD-10-CM | POA: Diagnosis not present

## 2020-12-30 DIAGNOSIS — N183 Chronic kidney disease, stage 3 unspecified: Secondary | ICD-10-CM | POA: Diagnosis present

## 2020-12-30 DIAGNOSIS — D696 Thrombocytopenia, unspecified: Secondary | ICD-10-CM | POA: Diagnosis present

## 2020-12-30 DIAGNOSIS — I4891 Unspecified atrial fibrillation: Secondary | ICD-10-CM | POA: Diagnosis not present

## 2020-12-30 LAB — CBC WITH DIFFERENTIAL/PLATELET
Abs Immature Granulocytes: 0.05 10*3/uL (ref 0.00–0.07)
Basophils Absolute: 0 10*3/uL (ref 0.0–0.1)
Basophils Relative: 0 %
Eosinophils Absolute: 0.1 10*3/uL (ref 0.0–0.5)
Eosinophils Relative: 1 %
HCT: 54.6 % — ABNORMAL HIGH (ref 39.0–52.0)
Hemoglobin: 16.9 g/dL (ref 13.0–17.0)
Immature Granulocytes: 1 %
Lymphocytes Relative: 22 %
Lymphs Abs: 2.2 10*3/uL (ref 0.7–4.0)
MCH: 28.2 pg (ref 26.0–34.0)
MCHC: 31 g/dL (ref 30.0–36.0)
MCV: 91 fL (ref 80.0–100.0)
Monocytes Absolute: 1.1 10*3/uL — ABNORMAL HIGH (ref 0.1–1.0)
Monocytes Relative: 11 %
Neutro Abs: 6.7 10*3/uL (ref 1.7–7.7)
Neutrophils Relative %: 65 %
Platelets: 119 10*3/uL — ABNORMAL LOW (ref 150–400)
RBC: 6 MIL/uL — ABNORMAL HIGH (ref 4.22–5.81)
RDW: 18.4 % — ABNORMAL HIGH (ref 11.5–15.5)
WBC: 10.2 10*3/uL (ref 4.0–10.5)
nRBC: 0 % (ref 0.0–0.2)

## 2020-12-30 LAB — LACTIC ACID, PLASMA: Lactic Acid, Venous: 2.3 mmol/L (ref 0.5–1.9)

## 2020-12-30 MED ORDER — MORPHINE 100MG IN NS 100ML (1MG/ML) PREMIX INFUSION: 5.0000 mg/h | INTRAVENOUS | Status: DC

## 2020-12-30 MED ORDER — LORAZEPAM 2 MG/ML IJ SOLN
1.0000 mg | Freq: Once | INTRAMUSCULAR | Status: AC
  Administered 2020-12-30: 1 mg via INTRAVENOUS
  Filled 2020-12-30: qty 1

## 2020-12-30 MED ORDER — SODIUM CHLORIDE 0.9 % IV SOLN
5.0000 mg/h | INTRAVENOUS | Status: DC
  Administered 2020-12-30: 5 mg/h via INTRAVENOUS
  Filled 2020-12-30: qty 2

## 2020-12-30 NOTE — ED Provider Notes (Addendum)
[facility] Emergency Department Provider Note   ____________________________________________    I have reviewed the triage vital signs and the nursing notes.   HISTORY  Chief Complaint Altered Mental Status   History limited by Alzheimer's dementia  HPI Manuel Reynolds is a 87 y.o. male with significant past medical history as detailed below sent by rehabilitation facility for failure to thrive, worsening condition.  Review of medical records demonstrates patient was discharged on 25 January after rapid decline in condition.  Did have palliative care consult and at that time wife had decided that trial of rehabilitation was appropriate but no if no improvement or worsening she would prefer to start comfort measures.  Past Medical History:  Diagnosis Date  . Anxiety   . Hypertension   . 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 Thyroid disease     Patient Active Problem List   Diagnosis Date Noted  . Acute hypoxemic respiratory failure ([place])   . Dysphagia   . Weakness   . Palliative care by specialist   . Goals of care, counseling/discussion   . DNR (do not resuscitate)   . Acute metabolic encephalopathy 97/98/9211  . Dysarthria 12/15/2020  . Flash pulmonary edema ([place]) 12/12/2020  . CHF exacerbation ([place]) 12/11/2020  . CHF (congestive heart failure) ([place]) 12/10/2020  . History of deep venous thrombosis (DVT) of distal vein of left lower extremity 06/27/2020  . Alzheimer disease ([place]) 03/13/2020  . Anxiety, generalized 03/08/2020  . Loss of memory 03/08/2020  . Sleeping difficulty 03/08/2020  . Visual hallucination 03/08/2020  . Other insomnia 12/28/2019  . Pre-diabetes 12/17/2017  . Hypnopompic hallucination 06/19/2017  . CKD (chronic kidney disease), stage III ([place]) 03/12/2017  . Plantar fasciitis 03/11/2017  . Bilateral lower extremity edema 08/17/2015  . Lymphoma in remission ([place]) 05/25/2015  . Benign hypertension  with CKD (chronic kidney disease) stage III ([place]) 05/11/2015  . Hyperlipidemia 05/11/2015  . Hypothyroidism 05/11/2015  . Anxiety 05/11/2015  . ED (erectile dysfunction) 05/11/2015    Past Surgical History:  Procedure Laterality Date  . EYE SURGERY  2010   cataract    Prior to Admission medications   Medication Sig Start Date End Date Taking? Authorizing Provider  acebutolol (SECTRAL) 200 MG capsule TAKE 1 CAPSULE(200 MG) BY MOUTH DAILY 08/27/20   Parks Ranger, [name], DO  cholecalciferol (VITAMIN D) 25 MCG (1000 UNIT) tablet Take 1,000 Units by mouth daily.    [provider]  dorzolamide-timolol (COSOPT) 22.3-6.8 MG/ML ophthalmic solution Place 1 drop into both eyes 2 (two) times daily.    [provider]  furosemide (LASIX) 20 MG tablet TAKE 1 TABLET(20 MG) BY MOUTH DAILY AS NEEDED FOR FLUID RETENTION OR SWELLING 08/28/20   Karamalegos, [name], DO  latanoprost (XALATAN) 0.005 % ophthalmic solution Place 1 drop into both eyes at bedtime.    [provider]  levothyroxine (SYNTHROID) 50 MCG tablet TAKE 1 TABLET(50 MCG) BY MOUTH DAILY BEFORE BREAKFAST 08/08/20   Karamalegos, [name], DO  losartan (COZAAR) 50 MG tablet TAKE 1 TABLET(50 MG) BY MOUTH DAILY 08/03/20   Karamalegos, [name], DO  meclizine (ANTIVERT) 25 MG tablet Take 1 tablet (25 mg total) by mouth 3 (three) times daily as needed for dizziness. 09/18/20   Karamalegos, [name], DO  Probiotic Product (PROBIOTIC-10 PO) Take 1 tablet by mouth daily.    [provider]  sildenafil (VIAGRA) 100 MG tablet Take 1 tablet (100 mg total) by mouth as needed for  erectile dysfunction. 10/25/20   Malfi, Lupita Raider, FNP  simvastatin (ZOCOR) 40 MG tablet TAKE 1 TABLET(40 MG) BY MOUTH DAILY Patient taking differently: Take 40 mg by mouth at bedtime. 08/03/20   Karamalegos, [name], DO     Allergies Other, Sulfa antibiotics, and Sulfacetamide sodium  Family History  Problem Relation Age of  Onset  . Hypertension Father   . Hypertension Mother   . Lung cancer Sister   . Diabetes Maternal Aunt   . Diabetes Maternal Uncle   . Diabetes Maternal Aunt   . Diabetes Maternal Uncle     Social History Social History   Tobacco Use  . Smoking status: Never Smoker  . Smokeless tobacco: Never Used  Vaping Use  . Vaping Use: Never used  Substance Use Topics  . Alcohol use: No  . Drug use: No    Level 5 caveat: Unable to obtain review of Systems     ____________________________________________   PHYSICAL EXAM:  VITAL SIGNS: ED Triage Vitals  Enc Vitals Group     BP 12/30/20 1740 (!) 138/108     Pulse Rate 12/30/20 1740 60     Resp 12/30/20 1740 16     Temp 12/30/20 1740 (!) 93.8 F (34.3 C)     Temp Source 12/30/20 1740 Rectal     SpO2 12/30/20 1739 100 %     Weight 12/30/20 1741 70 kg (154 lb 5.2 oz)     Height 12/30/20 1741 1.702 m (5\' 7" )     Head Circumference --      Peak Flow --      Pain Score 12/30/20 1741 0     Pain Loc --      Pain Edu? --      Excl. in [place]? --     Constitutional: Alert, disoriented Eyes: Conjunctivae are normal.  Head: Atraumatic. Nose: No congestion/rhinnorhea. Mouth/Throat: Mucous membranes are dry  Cardiovascular: Normal rate, regular rhythm. Grossly normal heart sounds.  Good peripheral circulation. Respiratory: Normal respiratory effort.  No retractions. Lungs CTAB. Gastrointestinal: Soft and nontender. No distention.     Neurologic: Moving all extremities Skin:  Skin is warm, dry and intact. No rash noted.   ____________________________________________   LABS (all labs ordered are listed, but only abnormal results are displayed)  Labs Reviewed  LACTIC ACID, PLASMA - Abnormal; Notable for the following components:      Result Value   Lactic Acid, Venous 2.3 (*)    All other components within normal limits  CULTURE, BLOOD (SINGLE)  URINE CULTURE  LACTIC ACID, PLASMA  COMPREHENSIVE METABOLIC PANEL  CBC WITH  DIFFERENTIAL/PLATELET  URINALYSIS, COMPLETE (UACMP) WITH MICROSCOPIC  PROTIME-INR  APTT  TROPONIN I (HIGH SENSITIVITY)   ____________________________________________  EKG  ED ECG REPORT I, Lavonia Drafts, the attending physician, personally viewed and interpreted this ECG.  Date: 12/30/2020  Rhythm: Atrial fibrillation QRS Axis: normal Intervals: Prolonged QT ST/T Wave abnormalities: normal Narrative Interpretation: no evidence of acute ischemia  ____________________________________________  RADIOLOGY  Chest x-ray reviewed by me, no acute abnormality ____________________________________________   PROCEDURES  Procedure(s) performed: No  Procedures   Critical Care performed: No ____________________________________________   INITIAL IMPRESSION / ASSESSMENT AND PLAN / ED COURSE  Pertinent labs & imaging results that were available during my care of the patient were reviewed by me and considered in my medical decision making (see chart for details).  Patient presents with altered mental status, found to be hypothermic with a temperature of 93.8.  Initial blood  pressure relatively normal but on recheck had worsened to 78/44.  Given hypothermia, hypotension, suspicious for sepsis.   notified by [facility] the received a sodium result of 170  Reviewed medical records detailing patient's wishes.  Discussed with his wife Manuel Reynolds who agrees with my assessment that patient is likely in the dying process and that any interventions at this time would not be beneficial.  She would like the patient to be put on comfort care so that he does not have any discomfort or any agitation  Patient is a DNR/DNI  I will give IV Ativan for mild agitation  We will start morphine infusion as well   Discussed with hospitalist to admit the patient   Hospitalist saw the patient but felt that patient likely will expire before getting to the floor so has not placed admission orders     ____________________________________________   FINAL CLINICAL IMPRESSION(S) / ED DIAGNOSES  Final diagnoses:  Sepsis, due to unspecified organism, unspecified whether acute organ dysfunction present [facility])        Note:  This document was prepared using Dragon voice recognition software and may include unintentional dictation errors.   Lavonia Drafts, MD 12/30/20 Dyann Ruddle    Lavonia Drafts, MD 12/30/20 2024

## 2020-12-30 NOTE — ED Notes (Signed)
Patient in no acute distress, shallow respirations. Continued decline. Sitter remains at bedside.

## 2020-12-30 NOTE — ED Notes (Signed)
Tech at bedside with patient to keep comfortable. Patient with shallow breathing. Cardiac monitor leads removed.

## 2020-12-30 NOTE — ED Notes (Signed)
Patient resting with tech at bedside for comfort. No needs identified.

## 2020-12-30 NOTE — H&P (Signed)
Manuel Reynolds JZP:[id number] DOB: [birth year] DOA: 12/29/2020     PCP: [name], DO   Outpatient Specialists:      Patient arrived to ER on 01/21/2021 at 1717 Referred by Attending Lavonia Drafts, MD   From facility  [facility]  Chief Complaint:   Chief Complaint  Patient presents with  . Altered Mental Status    HPI: Manuel Reynolds is a 87 y.o. male with medical history significant of Dementia Alzheimer's, anxiety, hypothyroidism,Lymphoma in remission,Hypertension, b/l lower extremity edema on prn lasix., CKDIII   Presented with   Abnormal labs Na up to 170  Last admission from 1/16 - 12/18/20 for CHF exacerbation and encephalopathy ( full work including MRI neg for CVA) Pt on dysphasia diet, has sever dementia at baseline concern for aspiration d/ced on augmentin    Has  been vaccinated against COVID and boosted   Initial COVID TEST   in house  PCR testing  Pending  Lab Results  Component Value Date   [place] 12/18/2020   [place] NEGATIVE 12/18/2020   [place] NEGATIVE 12/09/2020    Regarding pertinent Chronic problems:    Hyperlipidemia -  on statins Zocor Lipid Panel     Component Value Date/Time   CHOL 158 03/06/2020 0820   CHOL 169 03/21/2016 0824   TRIG 104 03/06/2020 0820   HDL 36 (L) 03/06/2020 0820   HDL 33 (L) 03/21/2016 0824   CHOLHDL 4.4 03/06/2020 0820   VLDL 25 06/16/2017 0904   LDLCALC 102 (H) 03/06/2020 0820   LABVLDL 31 03/21/2016 0824     HTN on cozaar, lasix   chronic CHF  Systolic  - last echo 4/80/16 - showing EF 50-55%    Hypothyroidism:  Lab Results  Component Value Date   TSH 3.720 12/10/2020   on synthroid       CKD stage IIIa- baseline Cr 1.1 Estimated Creatinine Clearance: 44.2 mL/min (by C-G formula based on SCr of 1.08 mg/dL).  Lab Results  Component Value Date   CREATININE 1.08 12/18/2020   CREATININE 1.30 (H) 12/17/2020   CREATININE 1.13 12/16/2020       Dementia - not on any  meds   While in ER: CXR unreamkable    Hospitalist was called for admission for severe sepsis comfort care  The following Work up has been ordered so far:  Orders Placed This Encounter  Procedures  . Blood culture (routine single)  . Urine culture  . DG Chest Port 1 View  . Lactic acid, plasma  . Comprehensive metabolic panel  . CBC WITH DIFFERENTIAL  . Urinalysis, Complete w Microscopic  . Protime-INR  . APTT  . Diet NPO time specified  . Cardiac monitoring  . Document height and weight  . Assess and Document Glasgow Coma Scale  . Document vital signs within 1-hour of fluid bolus completion.  Notify provider of abnormal vital signs despite fluid resuscitation.  . Refer to Sidebar Report: Sepsis Bundle ED/IP  . Notify provider for difficulties obtaining IV access  . Initiate Carrier Fluid Protocol  . Do not attempt resuscitation (DNR)  . Consult to hospitalist  ALL PATIENTS BEING ADMITTED/HAVING PROCEDURES NEED COVID-19 SCREENING  . Pulse oximetry, continuous  . EKG 12-Lead  . ED EKG 12-Lead  . Insert peripheral IV X 1    Following Medications were ordered in ER: Medications  morphine 168m in NS 1024m(70m88mL) infusion - premix (has no administration in time range)  LORazepam (ATIVAN) injection 1 mg (has  no administration in time range)        Consult Orders  (From admission, onward)         Start     Ordered   12/25/2020 1833  Consult to hospitalist  ALL PATIENTS BEING ADMITTED/HAVING PROCEDURES NEED COVID-19 SCREENING  Once       Comments: ALL PATIENTS BEING ADMITTED/HAVING PROCEDURES NEED COVID-19 SCREENING  Provider:  (Not yet assigned)  Question Answer Comment  Place call to: [phone]   Reason for Consult Admit   Diagnosis/Clinical Info for Consult: comfort care      01/18/2021 1833           Significant initial  Findings: Abnormal Labs Reviewed  LACTIC ACID, PLASMA - Abnormal; Notable for the following components:      Result Value   Lactic Acid,  Venous 2.3 (*)    All other components within normal limits  CBC WITH DIFFERENTIAL/PLATELET - Abnormal; Notable for the following components:   RBC 6.00 (*)    HCT 54.6 (*)    RDW 18.4 (*)    Platelets 119 (*)    Monocytes Absolute 1.1 (*)    All other components within normal limits     Otherwise labs showing:    No results for input(s): NA, K, CO2, GLUCOSE, BUN, CREATININE, CALCIUM, MG, PHOS in the last 168 hours.  Cr    stable,   Lab Results  Component Value Date   CREATININE 1.08 12/18/2020   CREATININE 1.30 (H) 12/17/2020   CREATININE 1.13 12/16/2020    No results for input(s): AST, ALT, ALKPHOS, BILITOT, PROT, ALBUMIN in the last 168 hours. Lab Results  Component Value Date   CALCIUM 8.7 (L) 12/18/2020    WBC    Component Value Date/Time   WBC 10.2  1731   LYMPHSABS 2.2 12/27/2020 1731   LYMPHSABS 2.7 03/21/2016 0824   LYMPHSABS 2.5 09/27/2014 1045   MONOABS 1.1 (H) 01/15/2021 1731   MONOABS 0.9 09/27/2014 1045   EOSABS 0.1 01/03/2021 1731   EOSABS 0.1 03/21/2016 0824   EOSABS 0.1 09/27/2014 1045   BASOSABS 0.0 01/07/2021 1731   BASOSABS 0.0 03/21/2016 0824   BASOSABS 0.0 09/27/2014 1045    Plt: Lab Results  Component Value Date   PLT 119 (L) 12/28/2020   Lactic Acid, Venous    Component Value Date/Time   LATICACIDVEN 2.3 ([place]) 01/02/2021 1731         ECG: Ordered Personally reviewed by me showing: HR : 60 Rhythm:   A.fib.     no evidence of ischemic changes QTC 553   BNP (last 3 results) Recent Labs    12/09/20 2207  BNP 2,103.1*    DM  labs:  HbA1C: Recent Labs    03/06/20 0820  HGBA1C 5.7*     CXR -  NON acute    ED Triage Vitals  Enc Vitals Group     BP 01/18/2021 1740 (!) 138/108     Pulse Rate 01/13/2021 1740 60     Resp 01/05/2021 1740 16     Temp 01/19/2021 1740 (!) 93.8 F (34.3 C)     Temp Source 01/16/2021 1740 Rectal     SpO2 01/17/2021 1739 100 %     Weight 01/19/2021 1741 154 lb 5.2 oz (70 kg)     Height 01/10/2021  1741 '5\' 7"'  (1.702 m)     Head Circumference --      Peak Flow --      Pain Score 01/12/2021  1741 0     Pain Loc --      Pain Edu? --      Excl. in [place]? --   TMAX(24)@       Latest  Blood pressure (!) 78/44, pulse (!) 59, temperature (!) 93.8 F (34.3 C), temperature source Rectal, resp. rate 14, height '5\' 7"'  (1.702 m), weight 70 kg, SpO2 96 %.     Review of Systems:    Pertinent positives include:   fatigue,   Constitutional:  No weight loss, night sweats, Fevers, chills,weight loss  HEENT:  No headaches, Difficulty swallowing,Tooth/dental problems,Sore throat,  No sneezing, itching, ear ache, nasal congestion, post nasal drip,  Cardio-vascular:  No chest pain, Orthopnea, PND, anasarca, dizziness, palpitations.no Bilateral lower extremity swelling  GI:  No heartburn, indigestion, abdominal pain, nausea, vomiting, diarrhea, change in bowel habits, loss of appetite, melena, blood in stool, hematemesis Resp:  no shortness of breath at rest. No dyspnea on exertion, No excess mucus, no productive cough, No non-productive cough, No coughing up of blood.No change in color of mucus.No wheezing. Skin:  no rash or lesions. No jaundice GU:  no dysuria, change in color of urine, no urgency or frequency. No straining to urinate.  No flank pain.  Musculoskeletal:  No joint pain or no joint swelling. No decreased range of motion. No back pain.  Psych:  No change in mood or affect. No depression or anxiety. No memory loss.  Neuro: no localizing neurological complaints, no tingling, no weakness, no double vision, no gait abnormality, no slurred speech, no confusion  All systems reviewed and apart from [place] all are negative  Past Medical History:   Past Medical History:  Diagnosis Date  . Anxiety   . Hypertension   . Lymphoma malignant, nodular, lymphocytic ([place]) 05/25/2015  . Thyroid disease       Past Surgical History:  Procedure Laterality Date  . EYE SURGERY  2010   cataract     Social History:      reports that he has never smoked. He has never used smokeless tobacco. He reports that he does not drink alcohol and does not use drugs.    Family History:   Family History  Problem Relation Age of Onset  . Hypertension Father   . Hypertension Mother   . Lung cancer Sister   . Diabetes Maternal Aunt   . Diabetes Maternal Uncle   . Diabetes Maternal Aunt   . Diabetes Maternal Uncle     Allergies: Allergies  Allergen Reactions  . Other Rash  . Sulfa Antibiotics Rash  . Sulfacetamide Sodium Rash     Prior to Admission medications   Medication Sig Start Date End Date Taking? Authorizing Provider  acebutolol (SECTRAL) 200 MG capsule TAKE 1 CAPSULE(200 MG) BY MOUTH DAILY 08/27/20   Parks Ranger, [name], DO  cholecalciferol (VITAMIN D) 25 MCG (1000 UNIT) tablet Take 1,000 Units by mouth daily.    [provider]  dorzolamide-timolol (COSOPT) 22.3-6.8 MG/ML ophthalmic solution Place 1 drop into both eyes 2 (two) times daily.    [provider]  furosemide (LASIX) 20 MG tablet TAKE 1 TABLET(20 MG) BY MOUTH DAILY AS NEEDED FOR FLUID RETENTION OR SWELLING 08/28/20   Karamalegos, [name], DO  latanoprost (XALATAN) 0.005 % ophthalmic solution Place 1 drop into both eyes at bedtime.    [provider]  levothyroxine (SYNTHROID) 50 MCG tablet TAKE 1 TABLET(50 MCG) BY MOUTH DAILY BEFORE BREAKFAST 08/08/20   Parks Ranger, [name], DO  losartan (COZAAR) 50 MG tablet TAKE 1 TABLET(50 MG) BY MOUTH DAILY 08/03/20   Karamalegos, [name], DO  meclizine (ANTIVERT) 25 MG tablet Take 1 tablet (25 mg total) by mouth 3 (three) times daily as needed for dizziness. 09/18/20   Karamalegos, [name], DO  Probiotic Product (PROBIOTIC-10 PO) Take 1 tablet by mouth daily.    [provider]  sildenafil (VIAGRA) 100 MG tablet Take 1 tablet (100 mg total) by mouth as needed for erectile dysfunction. 10/25/20   Malfi, Lupita Raider, FNP   simvastatin (ZOCOR) 40 MG tablet TAKE 1 TABLET(40 MG) BY MOUTH DAILY Patient taking differently: Take 40 mg by mouth at bedtime. 08/03/20   [name], DO   Physical Exam: Vitals with BMI 01/04/2021 01/04/2021 01/13/2021  Height - - '5\' 7"'   Weight - - 154 lbs 5 oz  BMI - - 97.74  Systolic 78 - -  Diastolic 44 - -  Pulse - 59 -     1. General:  in No Acute distress   Chronically ill  -appearing 2. Psychological: Alert and  Oriented 3. Head/ENT:    Dry Mucous Membranes                          Head Non traumatic, neck supple                            Poor Dentition 4. SKIN:   decreased Skin turgor,  Skin clean Dry and intact no rash 5. Heart: Regular rate and rhythm no Murmur, no Rub or gallop 6. Lungs:  no wheezes or crackles   7. Abdomen: Soft  non-tender, Non distended  bowel sounds present 8. Lower extremities: no clubbing, cyanosis, no   edema 9. Neurologically Grossly intact, moving all 4 extremities equally  10. MSK: Normal range of motion   All other LABS:    No results for input(s): WBC, NEUTROABS, HGB, HCT, MCV, PLT in the last 168 hours.   No results for input(s): NA, K, CL, CO2, GLUCOSE, BUN, CREATININE, CALCIUM, MG, PHOS in the last 168 hours.   No results for input(s): AST, ALT, ALKPHOS, BILITOT, PROT, ALBUMIN in the last 168 hours.     Cultures:    Component Value Date/Time   SDES BLOOD BLOOD LEFT FOREARM 12/09/2020 2254   SPECREQUEST  12/09/2020 2254    BOTTLES DRAWN AEROBIC AND ANAEROBIC Blood Culture results may not be optimal due to an excessive volume of blood received in culture bottles   CULT  12/09/2020 2254    NO GROWTH 5 DAYS Performed at [facility], [address] [place], [place] [address]    REPTSTATUS 12/14/2020 FINAL 12/09/2020 2254     Radiological Exams on Admission: DG Chest Port 1 View  Result Date: 12/26/2020 CLINICAL DATA:  Questionable sepsis evaluate for abnormality. EXAM: PORTABLE CHEST 1 VIEW COMPARISON:   Chest radiograph December 09, 2020 FINDINGS: The heart size and mediastinal contours are within normal limits. Senescent lung change. Linear right midlung opacity favored represent atelectasis. No focal consolidation. No pleural effusion. No pneumothorax. The visualized skeletal structures are unremarkable. IMPRESSION: 1. Chronic lung changes with a linear right midlung opacity favored represent atelectasis. No focal consolidation. 2. No pneumothorax. Electronically Signed   By: Dahlia Bailiff MD   On: 12/29/2020 18:35    Chart has been reviewed   Assessment/Plan  87 y.o. male with medical history significant  of Dementia Alzheimer's, anxiety, hypothyroidism,Lymphoma in remission,Hypertension, b/l lower extremity edema on prn lasix., CKDIII   Admitted for Severe sepsis, dehydration for comfort care only  Present on Admission: . SIRS with shock ([place]) - presumed diagnosis   -SIRS criteria met with hypothermia   RR >20 Today's Vitals   01/02/2021 2118 12/29/2020 2120  2200 01/04/2021 2230  BP:  (!) 52/38 (!) 49/32 (!) 52/29  Pulse: (!) 55 (!) 55 (!) 54 (!) 53  Resp: 17 (!) 25 (!) 7 (!) 6  Temp:      TempSrc:      SpO2: 100% 100% 100% 100%  Weight:      Height:      PainSc:          SIRS criteria were due to ( dehydration, ) At this Time no source of infection     elevated lactic acid >2     Component Value Date/Time   LATICACIDVEN 2.3 (HH) 01/04/2021 5027    acute metabolic encephalopathy  XAJ<28 mmhg or MAP < 65 mmhg,   Patient is in SIRS with  shock with  (SBP < 90 mmhg/MAP < 65 mmhg  ). Fluid resuscitation however was limited because patient is  comfort care status  shared decision making with Wife who prefer to avoid  resuscitation with fluid  Patient is comfort care only had extensive discussion with wife at this point patient is actively passing away Supported by blood pressure and respiratory rate not sustainable for life Patient is comfort care only In emergency department  was started on Ativan as needed and morphine drip Patient appears to be comfortable at this time.  12:12 AM  . Lymphoma in remission ([place]) - chronic stable . Hypothyroidism - chronic stable . Hyperlipidemia- chronic stable . CKD (chronic kidney disease), stage III ([place])- chronic stable . Acute metabolic encephalopathy - due to hypotention . Alzheimer disease ([place]) - Ativan PRN . Hypernatremia - due to dehydration, decreased PO intake Pt is comfort care, no lab sticks, holding off on IV fluids    Other plan as per orders.  DVT prophylaxis:  Comfort care       Code Status:    Code Status: DNR  DNR/DNI  comfort care as per family  I had personally discussed CODE STATUS with  family   Family Communication:   Family not at  Bedside  plan of care was discussed on the phone with  Wife,   Disposition Plan:   [facility] death within Hours                      Consults called: none  Admission status:  ED Disposition    ED Disposition Condition [place]: [place] [[id number]]  Level of Care: Med-Surg [16]  Covid Evaluation: Asymptomatic Screening Protocol (No Symptoms)  Diagnosis: Hypernatremia [[id number]]  Admitting Physician: Toy Baker [[id number]]  Attending Physician: Toy Baker [[id number]]     obs   Level of care        medical floor    Lab Results  Component Value Date   [place] 12/18/2020     Precautions: admitted as  asymptomatic screening protocol   PPE: Used by the provider:   N95   eye Goggles,  Gloves     Manuel Reynolds 01-15-21, 12:55 AM    [facility]     after 2 AM please page floor coverage PA If 7AM-7PM, please contact the day  team taking care of the patient using [email or web address]   Patient was evaluated in the context of the global COVID-19 pandemic, which necessitated consideration that the patient might be at risk for infection with the SARS-CoV-2 virus that causes  COVID-19. Institutional protocols and algorithms that pertain to the evaluation of patients at risk for COVID-19 are in a state of rapid change based on information released by regulatory bodies including the CDC and federal and state organizations. These policies and algorithms were followed during the patient's care.

## 2020-12-30 NOTE — ED Notes (Signed)
Cancelled all pending patient labs per MD. Patient on comfort care.

## 2020-12-30 NOTE — ED Triage Notes (Signed)
Reported via EMS for altered mental status, failure to thrive, no intake for 2 days, and possible groin pain. Patient typically able to converse but has had decline in past 2 days.

## 2020-12-30 NOTE — ED Notes (Signed)
Received call from RN at [facility] who stated that patient had labs earlier today and stated that Sodium was critical at 170

## 2020-12-31 DIAGNOSIS — I959 Hypotension, unspecified: Secondary | ICD-10-CM | POA: Diagnosis present

## 2020-12-31 DIAGNOSIS — D696 Thrombocytopenia, unspecified: Secondary | ICD-10-CM | POA: Diagnosis present

## 2020-12-31 DIAGNOSIS — E872 Acidosis, unspecified: Secondary | ICD-10-CM | POA: Diagnosis present

## 2020-12-31 DIAGNOSIS — T68XXXA Hypothermia, initial encounter: Secondary | ICD-10-CM | POA: Diagnosis present

## 2020-12-31 DIAGNOSIS — R6510 Systemic inflammatory response syndrome (SIRS) of non-infectious origin without acute organ dysfunction: Secondary | ICD-10-CM | POA: Diagnosis present

## 2020-12-31 MED ORDER — ACETAMINOPHEN 325 MG PO TABS: 650.0000 mg | ORAL_TABLET | Freq: Four times a day (QID) | ORAL | Status: DC | PRN

## 2020-12-31 MED ORDER — HALOPERIDOL LACTATE 5 MG/ML IJ SOLN: 0.5000 mg | INTRAMUSCULAR | Status: DC | PRN

## 2020-12-31 MED ORDER — LORAZEPAM 2 MG/ML IJ SOLN: 1.0000 mg | INTRAMUSCULAR | Status: DC | PRN

## 2020-12-31 MED ORDER — HALOPERIDOL LACTATE 2 MG/ML PO CONC
0.5000 mg | ORAL | Status: DC | PRN
  Filled 2020-12-31: qty 0.3

## 2020-12-31 MED ORDER — BIOTENE DRY MOUTH MT LIQD
15.0000 mL | OROMUCOSAL | Status: DC | PRN
  Filled 2020-12-31: qty 15

## 2020-12-31 MED ORDER — SODIUM CHLORIDE 0.9 % IV SOLN: 250.0000 mL | INTRAVENOUS | Status: DC | PRN

## 2020-12-31 MED ORDER — POLYVINYL ALCOHOL 1.4 % OP SOLN
1.0000 [drp] | Freq: Four times a day (QID) | OPHTHALMIC | Status: DC | PRN
  Filled 2020-12-31: qty 15

## 2020-12-31 MED ORDER — LORAZEPAM 1 MG PO TABS: 1.0000 mg | ORAL_TABLET | ORAL | Status: DC | PRN

## 2020-12-31 MED ORDER — LORAZEPAM 2 MG/ML PO CONC: 1.0000 mg | ORAL | Status: DC | PRN

## 2020-12-31 MED ORDER — SODIUM CHLORIDE 0.9% FLUSH: 3.0000 mL | Freq: Two times a day (BID) | INTRAVENOUS | Status: DC

## 2020-12-31 MED ORDER — ACETAMINOPHEN 650 MG RE SUPP: 650.0000 mg | Freq: Four times a day (QID) | RECTAL | Status: DC | PRN

## 2020-12-31 MED ORDER — HALOPERIDOL 0.5 MG PO TABS
0.5000 mg | ORAL_TABLET | ORAL | Status: DC | PRN
  Filled 2020-12-31: qty 1

## 2020-12-31 MED ORDER — SODIUM CHLORIDE 0.9% FLUSH: 3.0000 mL | INTRAVENOUS | Status: DC | PRN

## 2020-12-31 MED ORDER — ONDANSETRON HCL 4 MG/2ML IJ SOLN: 4.0000 mg | Freq: Four times a day (QID) | INTRAMUSCULAR | Status: DC | PRN

## 2020-12-31 MED ORDER — GLYCOPYRROLATE 1 MG PO TABS
1.0000 mg | ORAL_TABLET | ORAL | Status: DC | PRN
  Filled 2020-12-31: qty 1

## 2020-12-31 MED ORDER — GLYCOPYRROLATE 0.2 MG/ML IJ SOLN
0.2000 mg | INTRAMUSCULAR | Status: DC | PRN
  Filled 2020-12-31: qty 1

## 2020-12-31 MED ORDER — ONDANSETRON 4 MG PO TBDP: 4.0000 mg | ORAL_TABLET | Freq: Four times a day (QID) | ORAL | Status: DC | PRN

## 2021-01-04 LAB — CULTURE, BLOOD (SINGLE): Culture: NO GROWTH

## 2021-01-09 ENCOUNTER — Ambulatory Visit: Payer: Medicare Other | Admitting: Family

## 2021-01-21 ENCOUNTER — Ambulatory Visit: Payer: Medicare Other | Admitting: Family Medicine

## 2021-01-22 NOTE — ED Notes (Signed)
Report to jennifer, rn.  

## 2021-01-22 NOTE — ED Notes (Signed)
Report from vanessa, rn. Pt to come to room 31, has not arrived yet.

## 2021-01-22 NOTE — Discharge Summary (Signed)
Death Summary  Manuel Reynolds MCN:[id number] DOB: 1930-05-03 DOA: 2021/01/09  PCP: [name], DO  Admit date: 01-09-21 Date of Death: 2021/01/10 Time of Death: 9:00 am.   History of present illness:  Manuel Reynolds is a 87 y.o. male with medical history significant of Dementia Alzheimer's, anxiety, hypothyroidism,Lymphoma in remission, Hypertension, b/l lower extremity edema on prn lasix, CKDIII who presented from SNF with Abnormal labs Na up to 170.  Last admission from 1/16 - 12/18/20 for CHF exacerbation and encephalopathy (full work including MRI neg for CVA). Pt on dysphasia diet, has sever dementia at baseline.    On presentation, pt was hypothermic 93.8, hypotensive, bradycardic.  CBC remarkable, but BMP was not obtained in the ED.  Lactic acid 2.3.  Pt was unresponsive.  ED provider discussed with wife who made the decision to put pt on comfort care.  Pt was started on morphine gtt in the ED.  Hospitalist service was asked to admit the pt.  Pt passed away the next morning while still in the ED.     Discharge Diagnoses:  Principal Problem:   Comfort measures only status Active Problems:   Hyperlipidemia   Hypothyroidism   Lymphoma in remission ([place])   CKD (chronic kidney disease), stage III (HCC)   Alzheimer disease (HCC)   History of deep venous thrombosis (DVT) of distal vein of left lower extremity   CHF (congestive heart failure) (HCC)   Acute metabolic encephalopathy   Acute hypoxemic respiratory failure (HCC)   Dysphagia   Weakness   Goals of care, counseling/discussion   DNR (do not resuscitate)   Hypernatremia   SIRS (systemic inflammatory response syndrome) (HCC)   Hypotension   Hypothermia   Lactic acidosis   Thrombocytopenia ([place])     The results of significant diagnostics from this hospitalization (including imaging, microbiology, ancillary and laboratory) are listed below for reference.    Significant Diagnostic Studies: MR BRAIN WO  CONTRAST  Result Date: 12/14/2020 CLINICAL DATA:  Neuro deficit, acute, stroke suspected EXAM: MRI HEAD WITHOUT CONTRAST TECHNIQUE: Multiplanar, multiecho pulse sequences of the brain and surrounding structures were obtained without intravenous contrast. COMPARISON:  None. FINDINGS: Please note that some image sequences are degraded by motion artifact. Brain: No diffusion-weighted signal abnormality. No intracranial hemorrhage. No midline shift, ventriculomegaly or extra-axial fluid collection. No mass lesion. Mild cerebral atrophy with ex vacuo dilatation. Moderate chronic microvascular ischemic changes. Vascular: Major intracranial flow voids are proximally preserved. Skull and upper cervical spine: Normal marrow signal. Sinuses/Orbits: Sequela of bilateral lens replacement. Clear paranasal sinuses and mastoid air cells. Other: None. IMPRESSION: No acute intracranial process. Mild cerebral atrophy and moderate chronic microvascular ischemic changes. Electronically Signed   By: Primitivo Gauze M.D.   On: 12/14/2020 14:14   [place] CHEST (PLEURAL EFFUSION)  Result Date: 12/13/2020 CLINICAL DATA:  Suggestion of bilateral pleural effusions by prior chest x-ray. EXAM: CHEST ULTRASOUND COMPARISON:  Chest x-ray on 12/09/2000 FINDINGS: Ultrasound demonstrates a small right pleural effusion and trace left pleural fluid. There was not enough fluid volume to perform thoracentesis on the right. IMPRESSION: Lack of sufficient pleural fluid volume to perform thoracentesis. Small right pleural effusion and trace left pleural effusion present. Electronically Signed   By: Aletta Edouard M.D.   On: 12/13/2020 10:50   DG Chest Port 1 View  Result Date: 01-09-21 CLINICAL DATA:  Questionable sepsis evaluate for abnormality. EXAM: PORTABLE CHEST 1 VIEW COMPARISON:  Chest radiograph December 09, 2020 FINDINGS: The heart size and mediastinal contours are  within normal limits. Senescent lung change. Linear right midlung opacity  favored represent atelectasis. No focal consolidation. No pleural effusion. No pneumothorax. The visualized skeletal structures are unremarkable. IMPRESSION: 1. Chronic lung changes with a linear right midlung opacity favored represent atelectasis. No focal consolidation. 2. No pneumothorax. Electronically Signed   By: Dahlia Bailiff MD   On: 12/30/2020 18:35   DG Chest Portable 1 View  Result Date: 12/09/2020 CLINICAL DATA:  Quit dyspnea, hypoxic, question fluid overload EXAM: PORTABLE CHEST 1 VIEW COMPARISON:  None. FINDINGS: Areas of mixed hazy interstitial and patchy opacity are present in both lungs with a perihilar and basilar predominance. Some additional areas of more coalescent opacity are seen in the medial lung bases with bandlike opacities in both lower lobes, possibly scarring or subsegmental atelectatic change. The pulmonary vascularity is indistinct. Small bilateral effusions. No pneumothoraces. The cardiac silhouette is likely enlarged, possibly cardiomegaly or pericardial effusion. The aorta is calcified. The remaining cardiomediastinal contours are unremarkable. No acute osseous or soft tissue abnormality. Degenerative changes are present in the imaged spine and shoulders. Telemetry leads and external support devices overlie the chest. IMPRESSION: 1. Features suggestive of CHF/volume overload with pulmonary edema and bilateral effusions. 2. More patchy and coalescent opacities could reflect developing alveolar edema though underlying infection is difficult to exclude. 3. Enlarged cardiac silhouette, possibly cardiomegaly or pericardial effusion. Electronically Signed   By: Lovena Le M.D.   On: 12/09/2020 22:45   ECHOCARDIOGRAM COMPLETE  Result Date: 12/10/2020    ECHOCARDIOGRAM REPORT   Patient Name:   Manuel Reynolds Date of Exam: 12/10/2020 Medical Rec #:  [id number]    Height:       67.0 in Accession #:    [id number]   Weight:       165.0 lb Date of Birth:  [birth year]    BSA:          1.863  m Patient Age:    41 years     BP:           148/93 mmHg Patient Gender: M            HR:           53 bpm. Exam Location:  [facility] Procedure: 2D Echo and Cardiac Doppler Indications:     CHF-acute systolic I45.80  History:         Patient has no prior history of Echocardiogram examinations.                  Risk Factors:Hypertension.  Sonographer:     Sherrie Sport RDCS (AE) Referring Phys:  [id number] Victoriano Lain A THOMAS Diagnosing Phys: Kathlyn Sacramento MD  Sonographer Comments: Technically challenging study due to limited acoustic windows, no apical window and suboptimal parasternal window. Image acquisition challenging due to patient body habitus. IMPRESSIONS  1. Left ventricular ejection fraction, by estimation, is 50 to 55%. The left ventricle has low normal function. Left ventricular endocardial border not optimally defined to evaluate regional wall motion. There is moderate left ventricular hypertrophy. Left ventricular diastolic parameters are indeterminate.  2. Right ventricular systolic function is normal. The right ventricular size is normal.  3. Moderate pleural effusion in the left lateral region.  4. The mitral valve is normal in structure. No evidence of mitral valve regurgitation. No evidence of mitral stenosis.  5. The aortic valve is normal in structure. Aortic valve regurgitation is not visualized. Mild to moderate aortic valve sclerosis. There is likely some degree of aortic stenosis. However,  no gradient was recorded.  6. The inferior vena cava is normal in size with greater than 50% respiratory variability, suggesting right atrial pressure of 3 mmHg. FINDINGS  Left Ventricle: Left ventricular ejection fraction, by estimation, is 50 to 55%. The left ventricle has low normal function. Left ventricular endocardial border not optimally defined to evaluate regional wall motion. The left ventricular internal cavity  size was normal in size. There is moderate left ventricular hypertrophy. Left ventricular  diastolic parameters are indeterminate. Right Ventricle: The right ventricular size is normal. No increase in right ventricular wall thickness. Right ventricular systolic function is normal. Left Atrium: Left atrial size was normal in size. Right Atrium: Right atrial size was normal in size. Pericardium: There is no evidence of pericardial effusion. Mitral Valve: The mitral valve is normal in structure. No evidence of mitral valve regurgitation. No evidence of mitral valve stenosis. Tricuspid Valve: The tricuspid valve is normal in structure. Tricuspid valve regurgitation is not demonstrated. No evidence of tricuspid stenosis. Aortic Valve: The aortic valve is normal in structure. Aortic valve regurgitation is not visualized. Mild to moderate aortic valve sclerosis/calcification is present, without any evidence of aortic stenosis. Pulmonic Valve: The pulmonic valve was normal in structure. Pulmonic valve regurgitation is not visualized. No evidence of pulmonic stenosis. Aorta: The aortic root is normal in size and structure. Venous: The inferior vena cava is normal in size with greater than 50% respiratory variability, suggesting right atrial pressure of 3 mmHg. IAS/Shunts: No atrial level shunt detected by color flow Doppler. Additional Comments: There is a moderate pleural effusion in the left lateral region.  LEFT VENTRICLE PLAX 2D LVIDd:         3.10 cm LVIDs:         2.80 cm LV PW:         1.50 cm LV IVS:        1.80 cm LVOT diam:     2.00 cm LVOT Area:     3.14 cm  LEFT ATRIUM         Index LA diam:    3.60 cm 1.93 cm/m                        PULMONIC VALVE AORTA                 PV Vmax:        0.54 m/s Ao Root diam: 3.20 cm PV Peak grad:   1.1 mmHg                       RVOT Peak grad: 2 mmHg   SHUNTS Systemic Diam: 2.00 cm Kathlyn Sacramento MD Electronically signed by Kathlyn Sacramento MD Signature Date/Time: 12/10/2020/1:37:06 PM    Final     Microbiology: Recent Results (from the past 240 hour(s))  Blood  culture (routine single)     Status: None (Preliminary result)   Collection Time: 12/30/20  5:31 PM   Specimen: BLOOD  Result Value Ref Range Status   Specimen Description BLOOD BLOOD LEFT FOREARM  Final   Special Requests   Final    BOTTLES DRAWN AEROBIC AND ANAEROBIC Blood Culture results may not be optimal due to an inadequate volume of blood received in culture bottles   Culture   Final    NO GROWTH < 24 HOURS Performed at [facility], [address], [place], [place] [address]    Report Status PENDING  Incomplete  Labs: Basic Metabolic Panel: No results for input(s): NA, K, CL, CO2, GLUCOSE, BUN, CREATININE, CALCIUM, MG, PHOS in the last 168 hours. Liver Function Tests: No results for input(s): AST, ALT, ALKPHOS, BILITOT, PROT, ALBUMIN in the last 168 hours. No results for input(s): LIPASE, AMYLASE in the last 168 hours. No results for input(s): AMMONIA in the last 168 hours. CBC: Recent Labs  Lab 12/30/20 1731  WBC 10.2  NEUTROABS 6.7  HGB 16.9  HCT 54.6*  MCV 91.0  PLT 119*   Cardiac Enzymes: No results for input(s): CKTOTAL, CKMB, CKMBINDEX, TROPONINI in the last 168 hours. D-Dimer No results for input(s): DDIMER in the last 72 hours. BNP: Invalid input(s): POCBNP CBG: No results for input(s): GLUCAP in the last 168 hours. Anemia work up No results for input(s): VITAMINB12, FOLATE, FERRITIN, TIBC, IRON, RETICCTPCT in the last 72 hours. Urinalysis    Component Value Date/Time   BILIRUBINUR Negative 11/30/2019 1345   PROTEINUR Negative 11/30/2019 1345   UROBILINOGEN 0.2 11/30/2019 1345   NITRITE Negative 11/30/2019 1345   LEUKOCYTESUR Negative 11/30/2019 1345   Sepsis Labs Invalid input(s): PROCALCITONIN,  WBC,  LACTICIDVEN   No charge note.  Pt passed away before he was seen by me.   SIGNED:  Enzo Bi, MD  [facility] 2021/01/09, 10:05 AM Pager   If 7PM-7AM, please contact night-coverage [email or web address] Password [email or web address]

## 2021-01-22 NOTE — ED Notes (Signed)
Oral care provided for dry mouth. Lip moisture applied.

## 2021-01-22 NOTE — ED Notes (Signed)
Patient resting comfortably with no acute distress noted. Sitter not currently at bedside.

## 2021-01-22 NOTE — Progress Notes (Signed)
   27-Jan-2021 0121  Clinical Encounter Type  Visited With Patient  Visit Type Patient actively dying  Referral From Family;Nurse  Consult/Referral To [place]   As the on-call chaplain, I responded to a page for Manuel Reynolds. The nurse informed me that he was actively dying and there was no family present. The family requested prayer for Manuel Reynolds. I had prayer with him.   [place], [place]

## 2021-01-22 NOTE — ED Notes (Signed)
Post mortem care complete.

## 2021-01-22 NOTE — ED Notes (Signed)
Not able to obtain a blood pressure at this time. Faint carotid pulse noted. Pt with resp rate 8.

## 2021-01-22 NOTE — ED Notes (Signed)
Pt cleansed of small amount of light brown jelly like stool. Clean brief applied, male purewick discontinued.

## 2021-01-22 NOTE — ED Notes (Signed)
Pt continues to have weak carotid pulse, resp rate 22 currently. Pt does not appear to be in pain. Skin color unchanged. Pt remains dry of urine and stool.

## 2021-01-22 NOTE — ED Notes (Addendum)
Received update from MD that admission order will be placed for patient in case bed becomes available. Patient appears comfortable. No acute distress or pain noted.

## 2021-01-22 NOTE — ED Notes (Signed)
Pt noted with apnea, agonal rhythm noted on monitor.  No palpable pulse noted.  No cardiac activity auscultated by this RN and confirmed by Rex Kras, RN.  TOD pronounced.  Wife notified.  MD notified.

## 2021-01-22 NOTE — Progress Notes (Signed)
Palliative note:   Thank you for this consult.   Attempted to complete consult- however, patient was deceased on my arrival.   Mariana Kaufman, [place] [facility]  Please call Palliative Medicine team phone with any questions [phone]. For individual providers please see AMION.  No charge

## 2021-01-22 NOTE — ED Notes (Signed)
Spoke with pt's spouse on phone to update her on inability to obtain blood pressure and decrease in resp rate. Spouse asked this RN to inform pt that "tell him [name] loves him very much". Pt informed of spouse's statement. Spouse informed that will notify her if pt status changes. Spouse in formed she may visit at any time in the emergency department. Spouse informed pt does not appear to be in pain.

## 2021-01-22 NOTE — ED Notes (Signed)
Pt with kussmal resps noted. Pt does not appear to be in pain. Morphine continues to infuse. Pt dry of urine and stool.

## 2021-01-22 NOTE — ED Notes (Signed)
Received pt from [place], rn. Pt is not responsive to stimuli, shallow slow resps alternating with shallow rapid respirations, skin mottling on upper extremities, but remains normal colored but cool on lower extremities, additional warm blankets provided. Axillary temp 95 degrees. Pt does not appear in pain or distress. Chaplain paged to offer support if needed.

## 2021-01-22 DEATH — deceased

## 2021-03-23 IMAGING — US US CHEST/MEDIASTINUM
1 series · 2 of 2 positions shown · non-contrast
Comparison: Chest x-ray on 12/09/2000

CLINICAL DATA: Suggestion of bilateral pleural effusions by prior
chest x-ray.

EXAM:
CHEST ULTRASOUND

[Series 1: us thoracentesis asp pleural s · 2 of 2 slices shown]
[im 1/2]
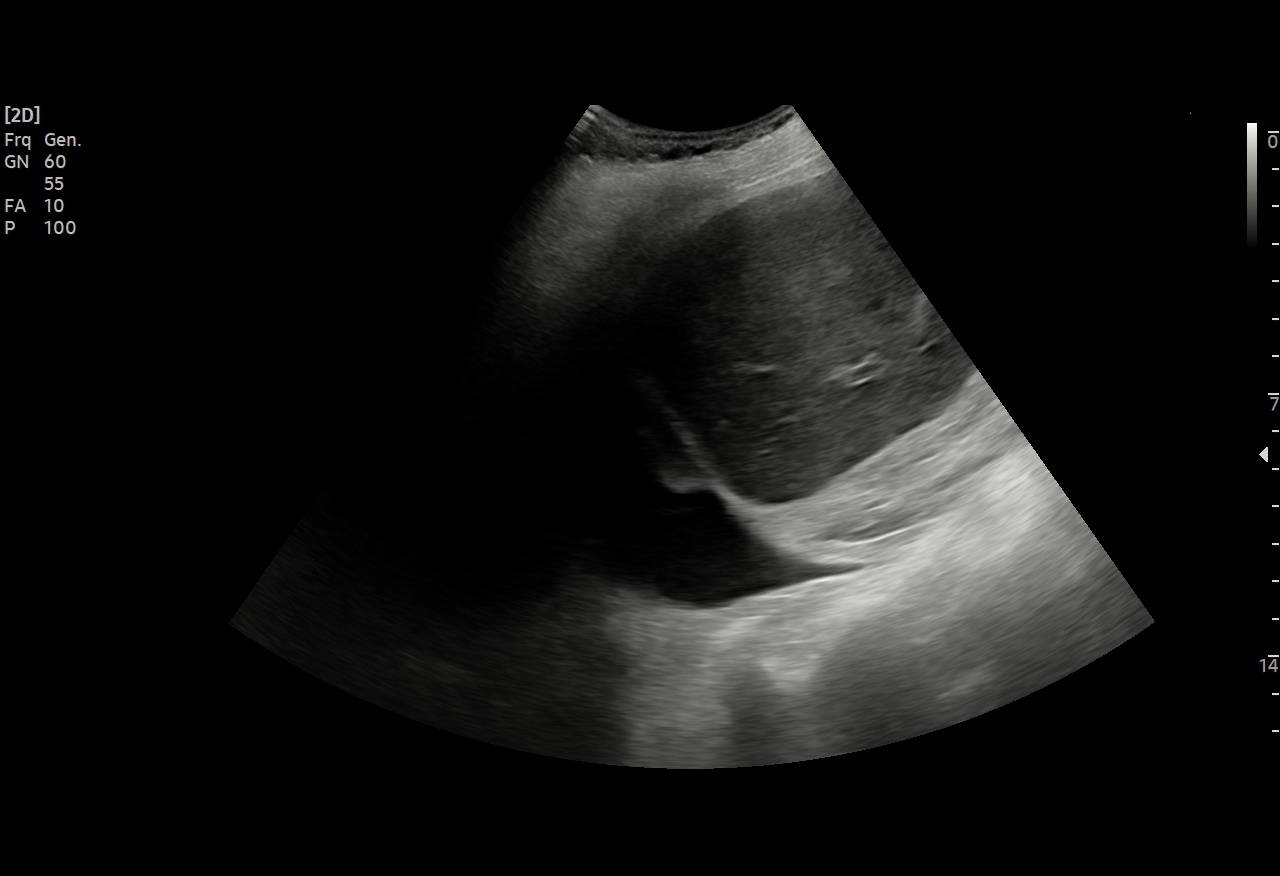
[im 2/2]
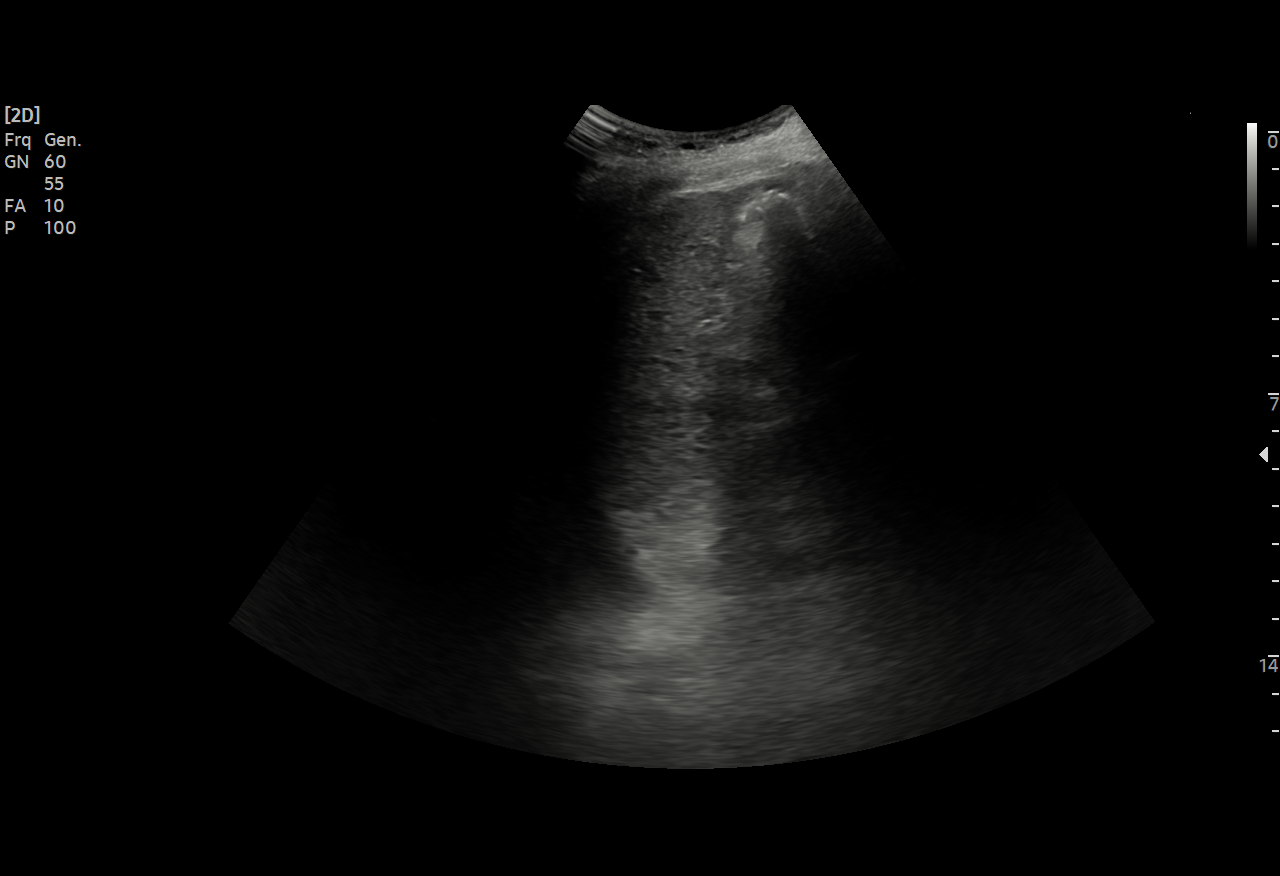

[2 of 2 positions shown; findings below may reference images not displayed]

FINDINGS: Ultrasound demonstrates a small right pleural effusion and trace
left pleural fluid. There was not enough fluid volume to perform
thoracentesis on the right.
IMPRESSION: Lack of sufficient pleural fluid volume to perform thoracentesis.
Small right pleural effusion and trace left pleural effusion
present.

## 2021-04-09 IMAGING — DX DG CHEST 1V PORT
1 series · 1 of 1 positions shown · non-contrast
Comparison: Chest radiograph December 09, 2020

CLINICAL DATA: Questionable sepsis evaluate for abnormality.

EXAM:
PORTABLE CHEST 1 VIEW

[chest ap]
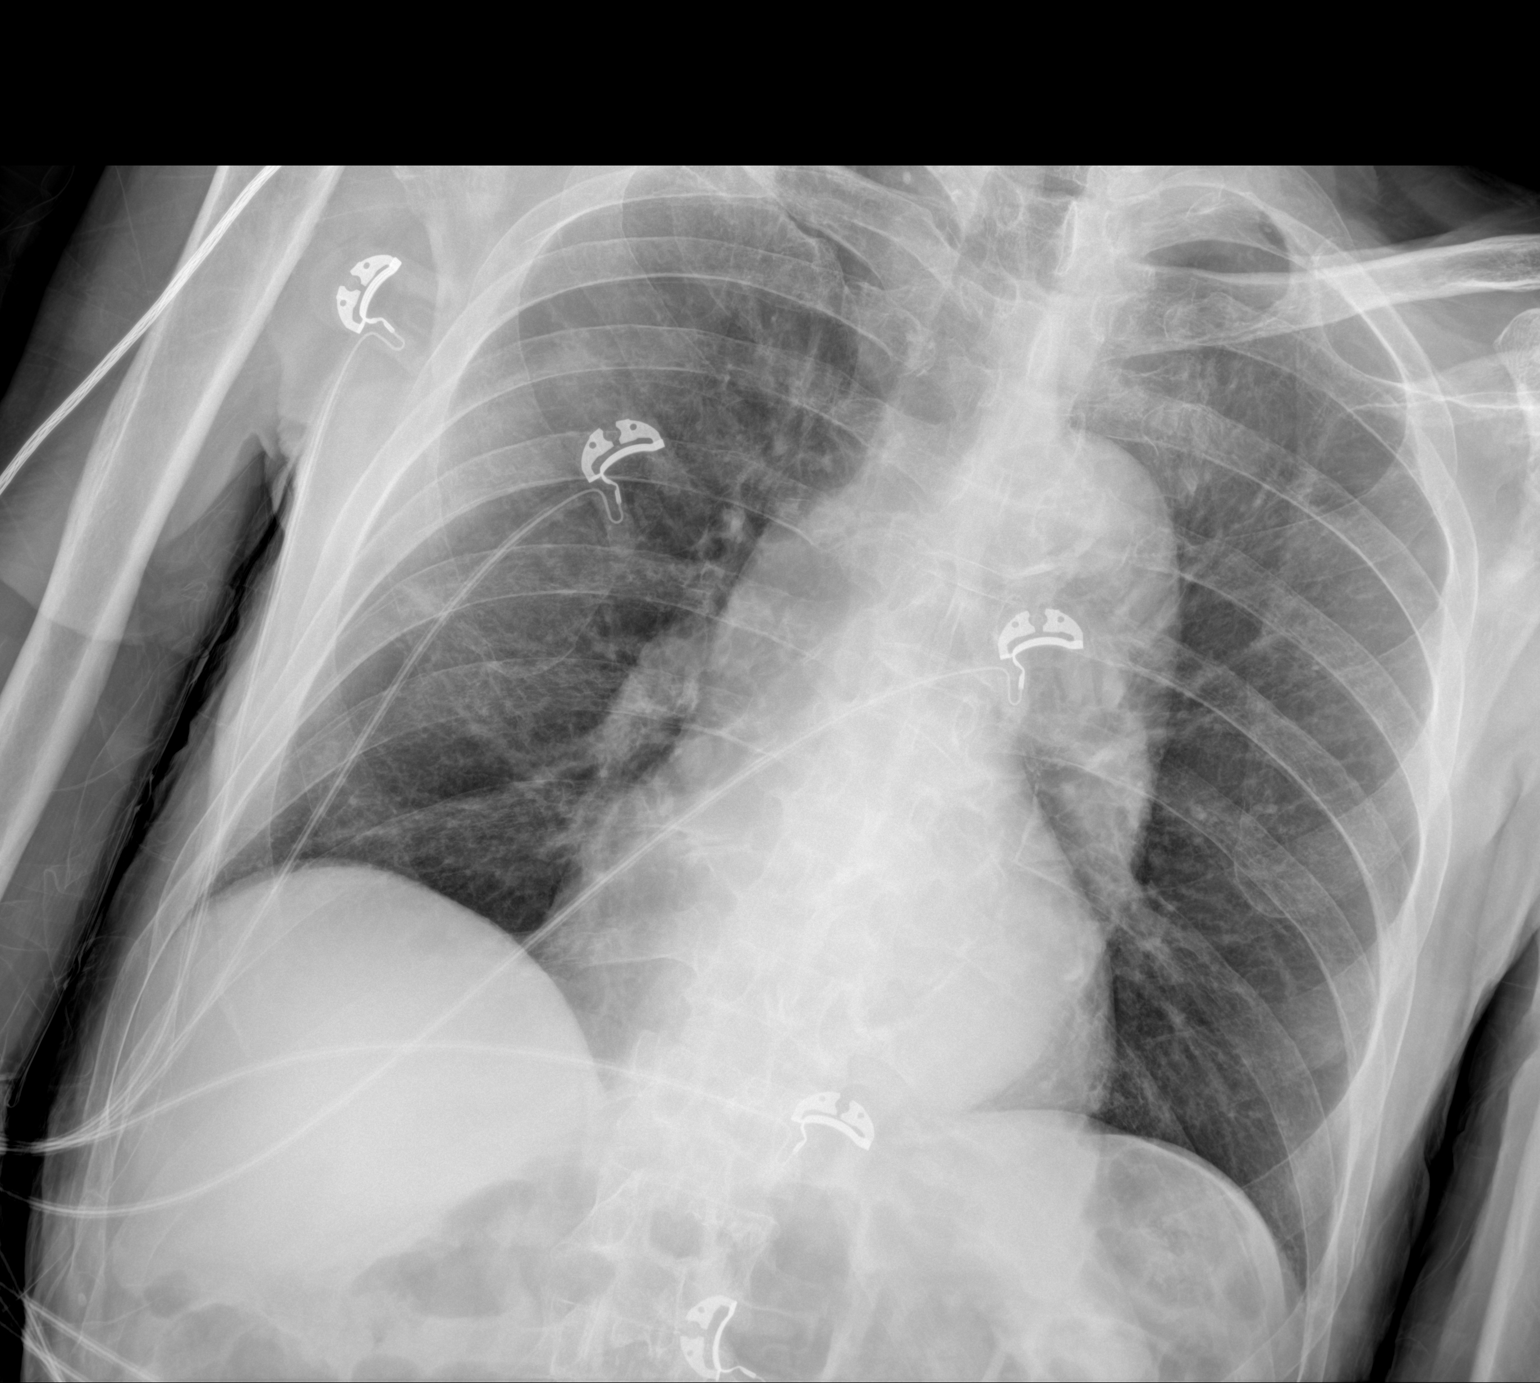

[1 of 1 positions shown; findings below may reference images not displayed]

FINDINGS: The heart size and mediastinal contours are within normal limits.
Senescent lung change. Linear right midlung opacity favored
represent atelectasis. No focal consolidation. No pleural effusion.
No pneumothorax. The visualized skeletal structures are
unremarkable.
IMPRESSION: 1. Chronic lung changes with a linear right midlung opacity favored
represent atelectasis. No focal consolidation.
2. No pneumothorax.

## 2021-09-19 ENCOUNTER — Ambulatory Visit: Payer: Medicare Other

## 2021-10-08 ENCOUNTER — Ambulatory Visit: Payer: Medicare Other
